# Patient Record
Sex: Female | Born: 1945
Health system: Southern US, Community
[De-identification: ages and names within clinical notes are randomized; demographics above are authoritative.]

## PROBLEM LIST (undated history)

## (undated) DIAGNOSIS — D126 Benign neoplasm of colon, unspecified: Secondary | ICD-10-CM

## (undated) DIAGNOSIS — K635 Polyp of colon: Secondary | ICD-10-CM

## (undated) DIAGNOSIS — M51369 Other intervertebral disc degeneration, lumbar region without mention of lumbar back pain or lower extremity pain: Secondary | ICD-10-CM

## (undated) DIAGNOSIS — R7989 Other specified abnormal findings of blood chemistry: Secondary | ICD-10-CM

## (undated) DIAGNOSIS — E079 Disorder of thyroid, unspecified: Secondary | ICD-10-CM

## (undated) DIAGNOSIS — G4700 Insomnia, unspecified: Secondary | ICD-10-CM

## (undated) DIAGNOSIS — D125 Benign neoplasm of sigmoid colon: Secondary | ICD-10-CM

## (undated) DIAGNOSIS — N318 Other neuromuscular dysfunction of bladder: Secondary | ICD-10-CM

## (undated) DIAGNOSIS — K648 Other hemorrhoids: Secondary | ICD-10-CM

## (undated) DIAGNOSIS — E785 Hyperlipidemia, unspecified: Secondary | ICD-10-CM

## (undated) DIAGNOSIS — M5136 Other intervertebral disc degeneration, lumbar region: Secondary | ICD-10-CM

## (undated) DIAGNOSIS — N189 Chronic kidney disease, unspecified: Secondary | ICD-10-CM

## (undated) DIAGNOSIS — K5790 Diverticulosis of intestine, part unspecified, without perforation or abscess without bleeding: Secondary | ICD-10-CM

## (undated) DIAGNOSIS — H269 Unspecified cataract: Secondary | ICD-10-CM

## (undated) DIAGNOSIS — I10 Essential (primary) hypertension: Secondary | ICD-10-CM

## (undated) DIAGNOSIS — F419 Anxiety disorder, unspecified: Secondary | ICD-10-CM

## (undated) DIAGNOSIS — R232 Flushing: Secondary | ICD-10-CM

## (undated) HISTORY — DX: Other intervertebral disc degeneration, lumbar region without mention of lumbar back pain or lower extremity pain: M51.369

## (undated) HISTORY — DX: Hyperlipidemia, unspecified: E78.5

## (undated) HISTORY — DX: Flushing: R23.2

## (undated) HISTORY — DX: Other hemorrhoids: K64.8

## (undated) HISTORY — DX: Benign neoplasm of colon, unspecified: D12.6

## (undated) HISTORY — DX: Polyp of colon: K63.5

## (undated) HISTORY — DX: Benign neoplasm of sigmoid colon: D12.5

## (undated) HISTORY — DX: Chronic kidney disease, unspecified: N18.9

## (undated) HISTORY — DX: Diverticulosis of intestine, part unspecified, without perforation or abscess without bleeding: K57.90

## (undated) HISTORY — DX: Other specified abnormal findings of blood chemistry: R79.89

## (undated) HISTORY — DX: Anxiety disorder, unspecified: F41.9

## (undated) HISTORY — DX: Unspecified cataract: H26.9

## (undated) HISTORY — DX: Other intervertebral disc degeneration, lumbar region: M51.36

## (undated) HISTORY — DX: Other neuromuscular dysfunction of bladder: N31.8

## (undated) HISTORY — DX: Essential (primary) hypertension: I10

## (undated) HISTORY — DX: Insomnia, unspecified: G47.00

## (undated) HISTORY — PX: CHOLECYSTECTOMY: SHX55

## (undated) HISTORY — DX: Disorder of thyroid, unspecified: E07.9

## (undated) HISTORY — PX: POLYPECTOMY: SHX149

---

## 1963-10-04 HISTORY — PX: ECTOPIC PREGNANCY SURGERY: SHX613

## 1967-10-04 HISTORY — PX: BREAST ENHANCEMENT SURGERY: SHX7

## 1998-04-03 ENCOUNTER — Ambulatory Visit (HOSPITAL_BASED_OUTPATIENT_CLINIC_OR_DEPARTMENT_OTHER): Admission: RE | Admit: 1998-04-03 | Discharge: 1998-04-03 | Payer: Self-pay | Admitting: Orthopedic Surgery

## 1998-12-02 ENCOUNTER — Ambulatory Visit (HOSPITAL_COMMUNITY): Admission: RE | Admit: 1998-12-02 | Discharge: 1998-12-02 | Payer: Self-pay | Admitting: Family Medicine

## 1998-12-10 ENCOUNTER — Other Ambulatory Visit: Admission: RE | Admit: 1998-12-10 | Discharge: 1998-12-10 | Payer: Self-pay | Admitting: *Deleted

## 1999-08-09 ENCOUNTER — Ambulatory Visit (HOSPITAL_COMMUNITY): Admission: RE | Admit: 1999-08-09 | Discharge: 1999-08-09 | Payer: Self-pay | Admitting: Gastroenterology

## 1999-12-14 ENCOUNTER — Other Ambulatory Visit: Admission: RE | Admit: 1999-12-14 | Discharge: 1999-12-14 | Payer: Self-pay | Admitting: *Deleted

## 2001-01-09 ENCOUNTER — Other Ambulatory Visit: Admission: RE | Admit: 2001-01-09 | Discharge: 2001-01-09 | Payer: Self-pay | Admitting: *Deleted

## 2002-02-09 ENCOUNTER — Emergency Department (HOSPITAL_COMMUNITY): Admission: EM | Admit: 2002-02-09 | Discharge: 2002-02-09 | Payer: Self-pay | Admitting: *Deleted

## 2002-05-04 ENCOUNTER — Emergency Department (HOSPITAL_COMMUNITY): Admission: EM | Admit: 2002-05-04 | Discharge: 2002-05-04 | Payer: Self-pay | Admitting: Emergency Medicine

## 2002-05-04 ENCOUNTER — Encounter: Payer: Self-pay | Admitting: Emergency Medicine

## 2002-05-20 ENCOUNTER — Encounter: Payer: Self-pay | Admitting: General Surgery

## 2002-05-20 ENCOUNTER — Encounter (INDEPENDENT_AMBULATORY_CARE_PROVIDER_SITE_OTHER): Payer: Self-pay | Admitting: *Deleted

## 2002-05-20 ENCOUNTER — Observation Stay (HOSPITAL_COMMUNITY): Admission: RE | Admit: 2002-05-20 | Discharge: 2002-05-21 | Payer: Self-pay | Admitting: General Surgery

## 2003-05-09 ENCOUNTER — Other Ambulatory Visit: Admission: RE | Admit: 2003-05-09 | Discharge: 2003-05-09 | Payer: Self-pay | Admitting: Obstetrics and Gynecology

## 2004-05-18 ENCOUNTER — Other Ambulatory Visit: Admission: RE | Admit: 2004-05-18 | Discharge: 2004-05-18 | Payer: Self-pay | Admitting: Obstetrics and Gynecology

## 2004-11-22 ENCOUNTER — Ambulatory Visit (HOSPITAL_COMMUNITY): Admission: RE | Admit: 2004-11-22 | Discharge: 2004-11-22 | Payer: Self-pay | Admitting: Gastroenterology

## 2004-12-14 ENCOUNTER — Other Ambulatory Visit: Admission: RE | Admit: 2004-12-14 | Discharge: 2004-12-14 | Payer: Self-pay | Admitting: Obstetrics and Gynecology

## 2005-08-11 ENCOUNTER — Other Ambulatory Visit: Admission: RE | Admit: 2005-08-11 | Discharge: 2005-08-11 | Payer: Self-pay | Admitting: Obstetrics and Gynecology

## 2007-04-23 ENCOUNTER — Ambulatory Visit: Admission: RE | Admit: 2007-04-23 | Discharge: 2007-04-23 | Payer: Self-pay | Admitting: Family Medicine

## 2007-05-09 ENCOUNTER — Emergency Department (HOSPITAL_COMMUNITY): Admission: EM | Admit: 2007-05-09 | Discharge: 2007-05-09 | Payer: Self-pay | Admitting: Emergency Medicine

## 2007-05-24 ENCOUNTER — Encounter: Admission: RE | Admit: 2007-05-24 | Discharge: 2007-05-24 | Payer: Self-pay | Admitting: Cardiology

## 2007-05-30 ENCOUNTER — Ambulatory Visit: Payer: Self-pay | Admitting: Internal Medicine

## 2007-06-12 ENCOUNTER — Ambulatory Visit: Payer: Self-pay | Admitting: Internal Medicine

## 2007-07-03 ENCOUNTER — Ambulatory Visit: Payer: Self-pay | Admitting: Internal Medicine

## 2007-09-18 ENCOUNTER — Telehealth: Payer: Self-pay | Admitting: Internal Medicine

## 2007-09-19 DIAGNOSIS — R0602 Shortness of breath: Secondary | ICD-10-CM | POA: Insufficient documentation

## 2007-09-19 DIAGNOSIS — I10 Essential (primary) hypertension: Secondary | ICD-10-CM | POA: Insufficient documentation

## 2007-09-20 ENCOUNTER — Ambulatory Visit: Payer: Self-pay | Admitting: Internal Medicine

## 2009-12-23 ENCOUNTER — Ambulatory Visit (HOSPITAL_COMMUNITY): Admission: RE | Admit: 2009-12-23 | Discharge: 2009-12-23 | Payer: Self-pay | Admitting: Obstetrics and Gynecology

## 2010-12-27 LAB — BASIC METABOLIC PANEL
BUN: 16 mg/dL (ref 6–23)
CO2: 24 mEq/L (ref 19–32)
Calcium: 8.8 mg/dL (ref 8.4–10.5)
Chloride: 104 mEq/L (ref 96–112)
Creatinine, Ser: 0.65 mg/dL (ref 0.4–1.2)
GFR calc Af Amer: >60 mL/min (ref >60)
GFR calc non Af Amer: >60 mL/min (ref >60)
Glucose, Bld: 114 mg/dL — ABNORMAL HIGH (ref 70–99)
Potassium: 3.7 mEq/L (ref 3.5–5.1)
Sodium: 134 mEq/L — ABNORMAL LOW (ref 135–145)

## 2010-12-27 LAB — URINALYSIS, ROUTINE W REFLEX MICROSCOPIC
Glucose, UA: NEGATIVE mg/dL
Ketones, ur: NEGATIVE mg/dL
Protein, ur: NEGATIVE mg/dL
Urobilinogen, UA: 0.2 mg/dL (ref 0.0–1.0)

## 2010-12-27 LAB — CBC
HCT: 35.7 % — ABNORMAL LOW (ref 36.0–46.0)
Hemoglobin: 12 g/dL (ref 12.0–15.0)
MCHC: 33.7 g/dL (ref 30.0–36.0)
MCV: 96.9 fL (ref 78.0–100.0)
Platelets: 267 10*3/uL (ref 150–400)
RBC: 3.69 MIL/uL — ABNORMAL LOW (ref 3.87–5.11)
RDW: 13.9 % (ref 11.5–15.5)
WBC: 6.1 10*3/uL (ref 4.0–10.5)

## 2010-12-27 LAB — URINE MICROSCOPIC-ADD ON

## 2011-02-15 NOTE — Assessment & Plan Note (Signed)
Kelli [name]                             PULMONARY OFFICE NOTE   [name], Kelli [name]                   MRN:          [id number]  DATE:06/12/2007                            DOB:          1946/08/25    PULMONARY/EXTENDED FOLLOWUP OFFICE VISIT   HISTORY:  This is a 65 year old white female with paroxysms of dyspnea  that occur at rest until she just gets a deep breath that I thought  might be reflux, ACE inhibitor, or panic related.  I had asked for her  to stop lisinopril on August 27 and she states she is no better.  She  denies any cough.  She also has shortness of breath with exertion, but  states that the problem is much worse at rest than it is with exertion.  She denies any exertional chest pain, orthopnea, PND, or significant  nocturnal exacerbation of these symptoms.   For full inventory of medications, please see face sheet dated June 12, 2007.   PHYSICAL EXAMINATION:  She is an anxious white female who cannot keep  still, pacing about the room, and opening her door and walking out into  the hall prior to the interview.  VITAL SIGNS:  She is afebrile with stable vital signs.  HEENT:  Unremarkable.  Oropharynx is clear.  LUNGS:  The lung fields are clear bilaterally to auscultation and  percussion.  CARDIAC:  Regular rate and rhythm without murmur, gallop, or rub.  ABDOMEN:  Soft and benign.  EXTREMITIES:  Warm without calf tenderness, cyanosis, clubbing, or  edema.   We walked the patient around the office for a lap and her pulse rate  increased to 115 with a saturation of 87%.  It was interesting in that  it did not bring on the typical symptoms she is experiencing.   CT scan was reviewed from August 21 and indicates no evidence of  pulmonary embolus or interstitial lung disease.  She has stable COPD  pattern.   IMPRESSION:  Activity-related desaturation is a reproducible problem  that actually does not explain her present  paroxysms of dyspnea at rest.  I believe this is related to chronic obstructive pulmonary disease and  have recommended a followup set of PFTs, but no treatment for now  directed at chronic obstructive pulmonary disease because I do not  believe it actually explains her symptoms.  Her pattern is more typical  of panic disorder, and I have recommended Xanax 1 mg at bedtime and then  a half a pill at breakfast, lunch, and supper to see if this improves  the problem.   I have advised her to return in 2 weeks for followup, but we would like  to see her sooner if needed.     Charlaine Dalton. Sherene Sires, MD, [facility]  Electronically Signed    MBW/MedQ  DD: 06/13/2007  DT: 06/14/2007  Job #: [id number]   cc:   Tally Joe, M.D.

## 2011-02-15 NOTE — Assessment & Plan Note (Signed)
[facility]                             PULMONARY OFFICE NOTE   [name], Kelli ZEISER                   MRN:          [id number]  DATE:07/03/2007                            DOB:          August 05, 1946    PULMONARY FINAL FOLLOWUP OFFICE VISIT:   HISTORY:  A 65 year old white female with unexplained dyspnea, doing  much better off of ACE inhibitors since May 30, 2007.  I had been  concerned because an exercise test here in the office suggested she  might have desaturated and that was not explained by an upper airway  problem, but she had normal PFTs in August including DLCO and a  subsequent CT scan of the chest was done on August 21 that showed no  evidence of thromboembolic disease or interstitial lung disease.   She comes back today all smiles with improving activity tolerance, no  nocturnal symptoms, chest pain, fevers, chills, orthopnea, PND or leg  swelling.   PHYSICAL EXAMINATION:  Her blood pressure today is only 98/64 after  taking Benicar 20/12.5 this morning.  She has stable vital signs.  On physical examination, she is a pleasant, ambulatory white female in  no acute distress.  HEENT:  Unremarkable.  Oropharynx clear.  LUNGS:  Fields are completely clear bilaterally to auscultation and  percussion, although she did have mild to moderate kyphotic deformity of  the chest.  There is a regular rhythm without murmur, gallop or rub.  ABDOMEN:  Soft and benign.  EXTREMITIES:  Warm without calf tenderness, cyanosis, clubbing or edema.   We walked the patient around the office again today and this time she  did not desaturate with a peak heart rate of 121.   IMPRESSION:  Marked improvement in symptoms simply by eliminating ACE  inhibitor from the regimen.  Her blood pressure, however, appears to be  over-treated at present on Benicar and therefore I am going to recommend  Micardis 40 to take one-half daily (samples for 2 months) until she  sees  Dr. Azucena Cecil for followup.   Parenthetically, I have noticed many patients who cannot tolerate ACE  inhibitor also have reflux, but since she has no overt symptoms now, I  do not believe we need to pursue this issue further.  Pulmonary followup  can therefore be as needed.     Charlaine Dalton. Sherene Sires, MD, [facility]  Electronically Signed   MBW/MedQ  DD: 07/03/2007  DT: 07/04/2007  Job #: [id number]   cc:   Tally Joe, M.D.

## 2011-02-15 NOTE — Assessment & Plan Note (Signed)
[facility]                             PULMONARY OFFICE NOTE   NAME:Mcduffey, BALERIA WYMAN                   MRN:          [id number]  DATE:05/30/2007                            DOB:          10-01-1946    PULMONARY CONSULTATION:   REQUESTED BY:  Dr. Azucena Cecil.   CHIEF COMPLAINT:  Dyspnea.   HISTORY:  This is a 65 year old white female whom I evaluated over 15  years ago for dyspnea that resolved without treatment.  I do not have  any of the records and she does not remember the impression or  recommendations that were made, but did not require any form of chronic  therapy thereafter.  She was, however, bothered by paroxysms of dyspnea  that would occur maybe 1 or 2 days a year, but seemed to get better on  their own without any treatment and left her with a sensation  intermittently that she just couldn't get a comfortable deep breath,  that would go away spontaneously within seconds (typical of  hyperventilation syndrome).  She comes in now, however, with a new  complaint that every breath is not deep enough since July that is  worse with exertion and now for the first time, also worse when she  tries to talk.  Associated with this symptom, she has classic voice  fatigue in that she becomes hoarse the more she talks, but has no  significant cough, pleuritic plain, exertional pain, fevers, chills,  sweats, orthopnea, PND, leg swelling or significant variability with any  particular trigger or alleviating factor.   PAST MEDICAL HISTORY:  1. Hypertension, for which she was placed on ACE inhibitors in April      that she says is all due to personal stress.  2. She is status post cholecystectomy and breast implant surgery.   ALLERGIES:  None known.   MEDICATIONS:  Provera, Lipitor, levothyroxine, estradiol and lisinopril;  for full dosing, please see the medication face sheet dated May 30, 2007.   SOCIAL HISTORY:  She quit smoking 8 years ago,  denies any respiratory  complaints related to this and has had no weight gain.  Since then she  has retired, but no unusual travel, pet or hobby exposure.   FAMILY HISTORY:  Significant for lung cancer in her mother, who was a  smoker.   REVIEW OF SYSTEMS:  Taken in detail on the worksheet and negative except  as outlined above.   PHYSICAL EXAMINATION:  This is a pleasant, ambulatory, but quite anxious  white female in no acute distress with classic voice fatigue.  She had  stable vital signs.  HEENT:  Oropharynx is clear.  Dentition intact.  NECK:  Supple without lymphadenopathy or tenderness.  Trachea is  midline.  No thyromegaly.  LUNGS:  Fields reveal classic pseudowheeze only.  Overall air movement  is adequate.  She did have moderate kyphosis.  Lung fields were  perfectly clear otherwise with excellent air movement.  There is a regular rate and rhythm without murmur, gallop or rub.  ABDOMEN:  Soft and benign.  EXTREMITIES:  Warm without calf tenderness,  cyanosis, clubbing or edema.   PFTs were reviewed from [facility] with the date not legible,  but apparently done this month and are completely normal.   CT was reviewed from August 21 and indicates no evidence of pulmonary  embolism, mild COPD changes only.   IMPRESSION:  Classic upper airways dysfunction suggested by the fact  that she has just as much trouble talking as she does with exertion and  becomes quite hoarse when she does talk (described as voice fatigue in  the note above).  She demonstrated this for me today as well as  pseudowheezing; this would strongly point to the upper airway being the  source of her problem.  I suspect this is ACE-inhibitor-related and/or  reflux related and recommended the following:   1. First I reviewed a gastroesophageal reflux disease diet with her      and recommended Nexium samples, but only for 10 days (to allow the      ACE inhibitor to wash out).  2. Switch from  lisinopril to Benicar 20/12.5 one daily.   I emphasized to the patient that ACE inhibitors specifically do not  cause this problem, but certainly can exacerbate a pre-existent problem  like low-grade reflux.  I also suspect that there is a component of  anxiety playing a role here and recommended that she use p.r.n.  alprazolam for this.   The main risk factor for chronic reflux would be the use of hormone  replacement therapy, but off of ACE inhibitors, this should not be an  issue.  If she does not clear quickly in terms of her symptoms, the next  step would be to do an exercise stress test and consider elimination of  hormone replacement therapy (or add a more aggressive reflux regimen  such as a combination of proton pump inhibitors plus metoclopramide).  However, I do not believe this patient has a significant pulmonary  problem at this point and did arrange for regular followup.     Charlaine Dalton. Sherene Sires, MD, [facility]  Electronically Signed    MBW/MedQ  DD: 05/30/2007  DT: 05/31/2007  Job #: [id number]   cc:   Tally Joe, M.D.

## 2011-02-18 NOTE — Op Note (Signed)
NAMEJORDI, Kelli [name]            ACCOUNT NO.:  [id number]   MEDICAL RECORD NO.:  [id number]          PATIENT TYPE:  AMB   LOCATION:  ENDO                         FACILITY:  [facility]   PHYSICIAN:  Bernette Redbird, M.D.   DATE OF BIRTH:  05-18-1946   DATE OF PROCEDURE:  11/22/2004  DATE OF DISCHARGE:                                 OPERATIVE REPORT   PROCEDURE:  Colonoscopy.   INDICATIONS:  Follow-up of previous small colonic adenoma removed many years  ago, with her last two surveillance exams having been negative.   FINDINGS:  Normal examination.   PROCEDURE:  The nature, purpose, risks of the procedure were familiar to the  patient, who provided written consent. Sedation was fentanyl 75 mcg and  Versed 8 milligrams IV without arrhythmias or desaturation. The Olympus  adjustable tension pediatric video colonoscope was advanced to the cecum  without significant difficulty, as identified by visualization of the  appendiceal orifice and the absence of further lumen, and pullback was then  performed. The quality of prep was very good, and it is felt that all areas  were adequately seen.   This was a normal examination. No polyps, cancer, colitis, vascular  malformations or diverticulosis were noted. No biopsies were obtained.  Retroflexion of the rectum and reinspection of the rectum were unremarkable.  The patient tolerated the procedure well, and there no apparent  complications.   IMPRESSION:  Normal surveillance colonoscopy in a patient with a prior  history of a colonic adenoma having been removed (V12.72).   PLAN:  Follow-up colonoscopy in 5 years.      RB/MEDQ  D:  11/22/2004  T:  11/22/2004  Job:  [id number]   cc:   Tally Joe, M.D.  [address] [place] [address]  [place], [place] [address]  Fax: [phone]

## 2011-02-18 NOTE — Op Note (Signed)
Kelli [name], Kelli [name]                     ACCOUNT NO.:  [id number]   MEDICAL RECORD NO.:  [id number]                   PATIENT TYPE:  OBV   LOCATION:  0375                                 FACILITY:  [facility]   PHYSICIAN:  Timothy E. Earlene Plater, M.D.              DATE OF BIRTH:  1946-09-25   DATE OF PROCEDURE:  05/20/2002  DATE OF DISCHARGE:                                 OPERATIVE REPORT   PREOPERATIVE DIAGNOSIS:  Acute episode chronic cholecystolithiasis.   POSTOPERATIVE DIAGNOSIS:  Acute episode chronic cholecystolithiasis.   OPERATIVE PROCEDURE:  Laparoscopic cholecystectomy and operative  cholangiogram.   SURGEON:  Timothy E. Earlene Plater, M.D.   ASSISTANT:  Velora Heckler, M.D.   ANESTHESIA:  General.   INDICATIONS FOR PROCEDURE:  The patient visited my office last week.  She  had had her second acute attack of gallbladder colic, was seen in the  emergency room.  Everything was normal except the ultrasound which showed  stones, slight elevation of the SGOT.  She was carefully counseled and she  wishes to proceed at this time.   DESCRIPTION OF PROCEDURE:  She was identified and permit properly signed and  evaluated by anesthesia, then taken to the operating room, placed supine  general endotracheal anesthesia administered.  The abdomen was prepped, and  draped in the usual fashion.  Marcaine 0.5% with epinephrine was used prior  to each incision.  An infraumbilical incision made, the fascia identified,  opened in the midline, the peritoneum entered without complication.  The  Hasson catheter placed, tied in place with #1 Vicryl, and the abdomen  insufflated.  Peritoneoscopy revealed a thickened, partially intrahepatic  gallbladder, and there was one isolated adhesion between an omental  epiploica to the anterior abdominal wall in the lower abdomen.  Attention  was turned to the gallbladder.  A second 10 mm trocar placed in the mid  epigastrium and two 5 mm trocars in the right  upper quadrant.  The  gallbladder was grasped, placed on tension, careful dissection at the base  of the gallbladder revealed a normal-appearing cystic structure entering the  gallbladder.  Behind that, was a normal-appearing cystic artery.  The cystic  duct was dissected out.  A clip was placed on the gallbladder side of the  cystic duct, small incision made in the cystic duct and using the Reddick  catheter which was placed into the cystic duct remnant, using real-time  fluoroscopy, cholangiograph was accomplished showing quick and smooth flow  of bile into the duodenum, filling a normal common bile duct, common hepatic  duct, and hepatic radicles.  There was no evidence for stone or obstruction.  The catheter was removed, the cystic duct remnant triply clipped, fully  divided.  Behind that, the cystic artery was quadruple clipped and divided.  The gallbladder was then dissected from the gallbladder bed.  One incidental  tear made in the gallbladder during this procedure, and there was  a slight  leak of bile and one stone.  The gallbladder was then completely removed  without further incident.  The gallbladder bed was dry.  The stone was  suctioned away.  The gallbladder was placed into an EndoCatch bag and then  copious irrigation carried out.  The EndoCatch bag and gallbladder removed  through the infraumbilical incision which was then tied under direct vision.  A single adhesion in the lower abdomen was divided with the Endoshears and  cautery.  There was no consequence.  Further irrigation was carried out until completely clear.  All irrigant,  CO2, trocars, and instruments removed under direct vision.  The skin  incisions were closed with 3-0 Monocryl.  Steri-Strips applied; dry sterile  dressing applied.  Counts correct.  She tolerated it well and was awakened  and taken to recovery room in good condition.                                               Timothy E. Earlene Plater, M.D.     TED/MEDQ  D:  05/20/2002  T:  05/20/2002  Job:  [id number]   cc:   Meredith Staggers, M.D.

## 2011-05-14 ENCOUNTER — Emergency Department (HOSPITAL_COMMUNITY): Payer: Medicare Other

## 2011-05-14 ENCOUNTER — Emergency Department (HOSPITAL_COMMUNITY)
Admission: EM | Admit: 2011-05-14 | Discharge: 2011-05-14 | Disposition: A | Discharge status: Home / Self Care | Payer: Medicare Other | Attending: Emergency Medicine | Admitting: Emergency Medicine

## 2011-05-14 DIAGNOSIS — R0609 Other forms of dyspnea: Secondary | ICD-10-CM | POA: Insufficient documentation

## 2011-05-14 DIAGNOSIS — Z79899 Other long term (current) drug therapy: Secondary | ICD-10-CM | POA: Insufficient documentation

## 2011-05-14 DIAGNOSIS — I10 Essential (primary) hypertension: Secondary | ICD-10-CM | POA: Insufficient documentation

## 2011-05-14 DIAGNOSIS — E789 Disorder of lipoprotein metabolism, unspecified: Secondary | ICD-10-CM | POA: Insufficient documentation

## 2011-05-14 DIAGNOSIS — F411 Generalized anxiety disorder: Secondary | ICD-10-CM | POA: Insufficient documentation

## 2011-05-14 DIAGNOSIS — R0602 Shortness of breath: Secondary | ICD-10-CM | POA: Insufficient documentation

## 2011-05-14 DIAGNOSIS — R0989 Other specified symptoms and signs involving the circulatory and respiratory systems: Secondary | ICD-10-CM | POA: Insufficient documentation

## 2011-05-14 DIAGNOSIS — E039 Hypothyroidism, unspecified: Secondary | ICD-10-CM | POA: Insufficient documentation

## 2011-05-14 LAB — POCT I-STAT TROPONIN I: Troponin i, poc: 0 ng/mL (ref 0.00–0.08)

## 2011-05-16 LAB — BASIC METABOLIC PANEL
Chloride: 106 mEq/L (ref 96–112)
GFR calc Af Amer: >60 mL/min (ref >60)
GFR calc non Af Amer: >60 mL/min (ref >60)
Potassium: 3.7 mEq/L (ref 3.5–5.1)
Sodium: 138 mEq/L (ref 135–145)

## 2011-05-16 LAB — DIFFERENTIAL
Lymphocytes Relative: 37 % (ref 12–46)
Lymphs Abs: 2.3 10*3/uL (ref 0.7–4.0)
Monocytes Relative: 9 % (ref 3–12)
Neutro Abs: 3.3 10*3/uL (ref 1.7–7.7)
Neutrophils Relative %: 54 % (ref 43–77)

## 2011-05-16 LAB — CBC
HCT: 33.5 % — ABNORMAL LOW (ref 36.0–46.0)
Hemoglobin: 11.7 g/dL — ABNORMAL LOW (ref 12.0–15.0)
RBC: 3.64 MIL/uL — ABNORMAL LOW (ref 3.87–5.11)

## 2011-07-18 LAB — DIFFERENTIAL
Basophils Relative: 0
Eosinophils Relative: 1
Monocytes Absolute: 0.8 — ABNORMAL HIGH
Monocytes Relative: 9
Neutro Abs: 5

## 2011-07-18 LAB — CBC
HCT: 38.5
Hemoglobin: 13.1
MCHC: 34.1
MCV: 93.9
RBC: 4.1

## 2011-07-18 LAB — POCT I-STAT CREATININE
Creatinine, Ser: 0.8
Operator id: 277751

## 2011-07-18 LAB — POCT CARDIAC MARKERS
Operator id: 277751
Troponin i, poc: <0.05

## 2011-07-18 LAB — I-STAT 8, (EC8 V) (CONVERTED LAB)
Bicarbonate: 24.2 — ABNORMAL HIGH
Glucose, Bld: 105 — ABNORMAL HIGH
Hemoglobin: 13.9
Sodium: 138
TCO2: 25
pH, Ven: 7.48 — ABNORMAL HIGH

## 2011-08-03 ENCOUNTER — Other Ambulatory Visit: Payer: Self-pay | Admitting: Obstetrics and Gynecology

## 2011-10-04 HISTORY — PX: ROTATOR CUFF REPAIR: SHX139

## 2011-10-13 DIAGNOSIS — N760 Acute vaginitis: Secondary | ICD-10-CM | POA: Diagnosis not present

## 2011-10-24 DIAGNOSIS — F411 Generalized anxiety disorder: Secondary | ICD-10-CM | POA: Diagnosis not present

## 2011-10-24 DIAGNOSIS — E782 Mixed hyperlipidemia: Secondary | ICD-10-CM | POA: Diagnosis not present

## 2011-10-24 DIAGNOSIS — R7309 Other abnormal glucose: Secondary | ICD-10-CM | POA: Diagnosis not present

## 2011-10-24 DIAGNOSIS — I10 Essential (primary) hypertension: Secondary | ICD-10-CM | POA: Diagnosis not present

## 2011-10-24 DIAGNOSIS — G4700 Insomnia, unspecified: Secondary | ICD-10-CM | POA: Diagnosis not present

## 2011-10-24 DIAGNOSIS — E039 Hypothyroidism, unspecified: Secondary | ICD-10-CM | POA: Diagnosis not present

## 2011-10-24 DIAGNOSIS — R232 Flushing: Secondary | ICD-10-CM | POA: Diagnosis not present

## 2011-10-28 ENCOUNTER — Ambulatory Visit: Payer: Medicare Other | Admitting: Gastroenterology

## 2011-11-09 DIAGNOSIS — J40 Bronchitis, not specified as acute or chronic: Secondary | ICD-10-CM | POA: Diagnosis not present

## 2011-11-21 DIAGNOSIS — N390 Urinary tract infection, site not specified: Secondary | ICD-10-CM | POA: Diagnosis not present

## 2011-11-23 DIAGNOSIS — N390 Urinary tract infection, site not specified: Secondary | ICD-10-CM | POA: Diagnosis not present

## 2012-01-23 DIAGNOSIS — M21619 Bunion of unspecified foot: Secondary | ICD-10-CM | POA: Diagnosis not present

## 2012-01-23 DIAGNOSIS — M25519 Pain in unspecified shoulder: Secondary | ICD-10-CM | POA: Diagnosis not present

## 2012-02-01 DIAGNOSIS — J069 Acute upper respiratory infection, unspecified: Secondary | ICD-10-CM | POA: Diagnosis not present

## 2012-02-03 DIAGNOSIS — M25519 Pain in unspecified shoulder: Secondary | ICD-10-CM | POA: Diagnosis not present

## 2012-02-07 DIAGNOSIS — N390 Urinary tract infection, site not specified: Secondary | ICD-10-CM | POA: Diagnosis not present

## 2012-02-17 DIAGNOSIS — M25519 Pain in unspecified shoulder: Secondary | ICD-10-CM | POA: Diagnosis not present

## 2012-02-24 DIAGNOSIS — M25519 Pain in unspecified shoulder: Secondary | ICD-10-CM | POA: Diagnosis not present

## 2012-02-29 DIAGNOSIS — M21619 Bunion of unspecified foot: Secondary | ICD-10-CM | POA: Diagnosis not present

## 2012-02-29 DIAGNOSIS — M7750 Other enthesopathy of unspecified foot: Secondary | ICD-10-CM | POA: Diagnosis not present

## 2012-03-13 DIAGNOSIS — N390 Urinary tract infection, site not specified: Secondary | ICD-10-CM | POA: Diagnosis not present

## 2012-03-13 DIAGNOSIS — N3000 Acute cystitis without hematuria: Secondary | ICD-10-CM | POA: Diagnosis not present

## 2012-03-23 DIAGNOSIS — R3989 Other symptoms and signs involving the genitourinary system: Secondary | ICD-10-CM | POA: Diagnosis not present

## 2012-03-23 DIAGNOSIS — N3941 Urge incontinence: Secondary | ICD-10-CM | POA: Diagnosis not present

## 2012-03-26 DIAGNOSIS — M25519 Pain in unspecified shoulder: Secondary | ICD-10-CM | POA: Diagnosis not present

## 2012-03-26 DIAGNOSIS — M25579 Pain in unspecified ankle and joints of unspecified foot: Secondary | ICD-10-CM | POA: Diagnosis not present

## 2012-04-27 DIAGNOSIS — R3989 Other symptoms and signs involving the genitourinary system: Secondary | ICD-10-CM | POA: Diagnosis not present

## 2012-04-27 DIAGNOSIS — N3020 Other chronic cystitis without hematuria: Secondary | ICD-10-CM | POA: Diagnosis not present

## 2012-04-27 DIAGNOSIS — N3941 Urge incontinence: Secondary | ICD-10-CM | POA: Diagnosis not present

## 2012-05-12 DIAGNOSIS — M25519 Pain in unspecified shoulder: Secondary | ICD-10-CM | POA: Diagnosis not present

## 2012-05-17 ENCOUNTER — Other Ambulatory Visit: Payer: Self-pay | Admitting: Orthopedic Surgery

## 2012-05-17 DIAGNOSIS — R52 Pain, unspecified: Secondary | ICD-10-CM

## 2012-05-22 ENCOUNTER — Ambulatory Visit
Admission: RE | Admit: 2012-05-22 | Discharge: 2012-05-22 | Disposition: A | Discharge status: Home / Self Care | Payer: Medicare Other | Source: Ambulatory Visit | Attending: Orthopedic Surgery | Admitting: Orthopedic Surgery

## 2012-05-22 DIAGNOSIS — M19019 Primary osteoarthritis, unspecified shoulder: Secondary | ICD-10-CM | POA: Diagnosis not present

## 2012-05-22 DIAGNOSIS — M719 Bursopathy, unspecified: Secondary | ICD-10-CM | POA: Diagnosis not present

## 2012-05-22 DIAGNOSIS — R52 Pain, unspecified: Secondary | ICD-10-CM

## 2012-05-22 DIAGNOSIS — M67919 Unspecified disorder of synovium and tendon, unspecified shoulder: Secondary | ICD-10-CM | POA: Diagnosis not present

## 2012-05-22 DIAGNOSIS — M25519 Pain in unspecified shoulder: Secondary | ICD-10-CM | POA: Diagnosis not present

## 2012-05-28 DIAGNOSIS — M75120 Complete rotator cuff tear or rupture of unspecified shoulder, not specified as traumatic: Secondary | ICD-10-CM | POA: Diagnosis not present

## 2012-06-07 DIAGNOSIS — M67919 Unspecified disorder of synovium and tendon, unspecified shoulder: Secondary | ICD-10-CM | POA: Diagnosis not present

## 2012-06-07 DIAGNOSIS — M719 Bursopathy, unspecified: Secondary | ICD-10-CM | POA: Diagnosis not present

## 2012-06-07 DIAGNOSIS — M19019 Primary osteoarthritis, unspecified shoulder: Secondary | ICD-10-CM | POA: Diagnosis not present

## 2012-06-26 DIAGNOSIS — M25519 Pain in unspecified shoulder: Secondary | ICD-10-CM | POA: Diagnosis not present

## 2012-07-02 DIAGNOSIS — M19019 Primary osteoarthritis, unspecified shoulder: Secondary | ICD-10-CM | POA: Diagnosis not present

## 2012-07-02 DIAGNOSIS — M25519 Pain in unspecified shoulder: Secondary | ICD-10-CM | POA: Diagnosis not present

## 2012-07-10 DIAGNOSIS — M25519 Pain in unspecified shoulder: Secondary | ICD-10-CM | POA: Diagnosis not present

## 2012-07-16 DIAGNOSIS — L219 Seborrheic dermatitis, unspecified: Secondary | ICD-10-CM | POA: Diagnosis not present

## 2012-07-17 DIAGNOSIS — M25519 Pain in unspecified shoulder: Secondary | ICD-10-CM | POA: Diagnosis not present

## 2012-07-24 DIAGNOSIS — M25519 Pain in unspecified shoulder: Secondary | ICD-10-CM | POA: Diagnosis not present

## 2012-07-30 DIAGNOSIS — M25519 Pain in unspecified shoulder: Secondary | ICD-10-CM | POA: Diagnosis not present

## 2012-08-06 DIAGNOSIS — M25519 Pain in unspecified shoulder: Secondary | ICD-10-CM | POA: Diagnosis not present

## 2012-08-09 DIAGNOSIS — R232 Flushing: Secondary | ICD-10-CM | POA: Diagnosis not present

## 2012-08-09 DIAGNOSIS — Z23 Encounter for immunization: Secondary | ICD-10-CM | POA: Diagnosis not present

## 2012-08-09 DIAGNOSIS — N318 Other neuromuscular dysfunction of bladder: Secondary | ICD-10-CM | POA: Diagnosis not present

## 2012-08-09 DIAGNOSIS — F411 Generalized anxiety disorder: Secondary | ICD-10-CM | POA: Diagnosis not present

## 2012-08-09 DIAGNOSIS — G4700 Insomnia, unspecified: Secondary | ICD-10-CM | POA: Diagnosis not present

## 2012-08-09 DIAGNOSIS — E039 Hypothyroidism, unspecified: Secondary | ICD-10-CM | POA: Diagnosis not present

## 2012-08-09 DIAGNOSIS — E782 Mixed hyperlipidemia: Secondary | ICD-10-CM | POA: Diagnosis not present

## 2012-08-09 DIAGNOSIS — R7309 Other abnormal glucose: Secondary | ICD-10-CM | POA: Diagnosis not present

## 2012-08-09 DIAGNOSIS — I10 Essential (primary) hypertension: Secondary | ICD-10-CM | POA: Diagnosis not present

## 2012-10-18 DIAGNOSIS — E039 Hypothyroidism, unspecified: Secondary | ICD-10-CM | POA: Diagnosis not present

## 2012-10-18 DIAGNOSIS — R7989 Other specified abnormal findings of blood chemistry: Secondary | ICD-10-CM | POA: Diagnosis not present

## 2012-10-18 DIAGNOSIS — R232 Flushing: Secondary | ICD-10-CM | POA: Diagnosis not present

## 2012-10-18 DIAGNOSIS — I10 Essential (primary) hypertension: Secondary | ICD-10-CM | POA: Diagnosis not present

## 2012-10-18 DIAGNOSIS — F411 Generalized anxiety disorder: Secondary | ICD-10-CM | POA: Diagnosis not present

## 2012-10-18 DIAGNOSIS — G4700 Insomnia, unspecified: Secondary | ICD-10-CM | POA: Diagnosis not present

## 2012-10-18 DIAGNOSIS — N318 Other neuromuscular dysfunction of bladder: Secondary | ICD-10-CM | POA: Diagnosis not present

## 2012-11-09 DIAGNOSIS — M25579 Pain in unspecified ankle and joints of unspecified foot: Secondary | ICD-10-CM | POA: Diagnosis not present

## 2012-11-09 DIAGNOSIS — M25519 Pain in unspecified shoulder: Secondary | ICD-10-CM | POA: Diagnosis not present

## 2012-11-29 ENCOUNTER — Other Ambulatory Visit: Payer: Self-pay | Admitting: Orthopaedic Surgery

## 2012-12-03 ENCOUNTER — Inpatient Hospital Stay: Admission: RE | Admit: 2012-12-03 | Payer: Medicare Other | Source: Ambulatory Visit

## 2012-12-05 ENCOUNTER — Ambulatory Visit
Admission: RE | Admit: 2012-12-05 | Discharge: 2012-12-05 | Disposition: A | Discharge status: Home / Self Care | Payer: Medicare Other | Source: Ambulatory Visit | Attending: Orthopaedic Surgery | Admitting: Orthopaedic Surgery

## 2012-12-05 DIAGNOSIS — M25519 Pain in unspecified shoulder: Secondary | ICD-10-CM | POA: Diagnosis not present

## 2012-12-10 DIAGNOSIS — M25519 Pain in unspecified shoulder: Secondary | ICD-10-CM | POA: Diagnosis not present

## 2012-12-18 ENCOUNTER — Other Ambulatory Visit: Payer: Self-pay

## 2012-12-18 ENCOUNTER — Other Ambulatory Visit: Payer: Self-pay | Admitting: *Deleted

## 2012-12-18 MED ORDER — CITALOPRAM HYDROBROMIDE 20 MG PO TABS: ORAL_TABLET | ORAL | Status: DC

## 2012-12-20 ENCOUNTER — Other Ambulatory Visit: Payer: Self-pay | Admitting: Geriatric Medicine

## 2012-12-20 DIAGNOSIS — R7989 Other specified abnormal findings of blood chemistry: Secondary | ICD-10-CM

## 2012-12-20 DIAGNOSIS — I10 Essential (primary) hypertension: Secondary | ICD-10-CM

## 2013-01-03 DIAGNOSIS — M25519 Pain in unspecified shoulder: Secondary | ICD-10-CM | POA: Diagnosis not present

## 2013-01-31 ENCOUNTER — Other Ambulatory Visit: Payer: Medicare Other

## 2013-01-31 DIAGNOSIS — R7989 Other specified abnormal findings of blood chemistry: Secondary | ICD-10-CM | POA: Diagnosis not present

## 2013-01-31 DIAGNOSIS — E039 Hypothyroidism, unspecified: Secondary | ICD-10-CM | POA: Diagnosis not present

## 2013-01-31 DIAGNOSIS — I10 Essential (primary) hypertension: Secondary | ICD-10-CM

## 2013-02-01 LAB — HEMOGLOBIN A1C
Est. average glucose Bld gHb Est-mCnc: 123 mg/dL
Hgb A1c MFr Bld: 5.9 % — ABNORMAL HIGH (ref 4.8–5.6)

## 2013-02-01 LAB — BASIC METABOLIC PANEL
BUN/Creatinine Ratio: 23 (ref 11–26)
BUN: 17 mg/dL (ref 8–27)
CO2: 28 mmol/L (ref 19–28)
Calcium: 9.5 mg/dL (ref 8.6–10.2)
Chloride: 104 mmol/L (ref 97–108)
Creatinine, Ser: 0.74 mg/dL (ref 0.57–1.00)
GFR calc Af Amer: 96 mL/min/{1.73_m2} (ref >59)
GFR calc non Af Amer: 83 mL/min/{1.73_m2} (ref >59)
Glucose: 94 mg/dL (ref 65–99)
Potassium: 5 mmol/L (ref 3.5–5.2)
Sodium: 142 mmol/L (ref 134–144)

## 2013-02-01 LAB — CBC WITH DIFFERENTIAL/PLATELET
Basophils Absolute: 0 10*3/uL (ref 0.0–0.2)
Basos: 0 % (ref 0–3)
Eos: 1 % (ref 0–5)
Eosinophils Absolute: 0.1 10*3/uL (ref 0.0–0.4)
HCT: 36.9 % (ref 34.0–46.6)
Hemoglobin: 12.5 g/dL (ref 11.1–15.9)
Immature Grans (Abs): 0 10*3/uL (ref 0.0–0.1)
Immature Granulocytes: 0 % (ref 0–2)
Lymphocytes Absolute: 2.1 10*3/uL (ref 0.7–3.1)
Lymphs: 41 % (ref 14–46)
MCH: 31.5 pg (ref 26.6–33.0)
MCHC: 33.9 g/dL (ref 31.5–35.7)
MCV: 93 fL (ref 79–97)
Monocytes Absolute: 0.5 10*3/uL (ref 0.1–0.9)
Monocytes: 10 % (ref 4–12)
Neutrophils Absolute: 2.5 10*3/uL (ref 1.4–7.0)
Neutrophils Relative %: 48 % (ref 40–74)
RBC: 3.97 x10E6/uL (ref 3.77–5.28)
RDW: 13.6 % (ref 12.3–15.4)
WBC: 5.1 10*3/uL (ref 3.4–10.8)

## 2013-02-01 LAB — TSH: TSH: 1.43 u[IU]/mL (ref 0.450–4.500)

## 2013-02-08 ENCOUNTER — Encounter: Payer: Self-pay | Admitting: *Deleted

## 2013-02-11 ENCOUNTER — Ambulatory Visit (INDEPENDENT_AMBULATORY_CARE_PROVIDER_SITE_OTHER): Payer: Medicare Other | Admitting: Internal Medicine

## 2013-02-11 ENCOUNTER — Encounter: Payer: Self-pay | Admitting: Internal Medicine

## 2013-02-11 VITALS — BP 106/60 | HR 71 | Temp 97.7°F | Resp 12 | Ht <= 58 in | Wt 121.0 lb

## 2013-02-11 DIAGNOSIS — J302 Other seasonal allergic rhinitis: Secondary | ICD-10-CM

## 2013-02-11 DIAGNOSIS — F3289 Other specified depressive episodes: Secondary | ICD-10-CM

## 2013-02-11 DIAGNOSIS — J309 Allergic rhinitis, unspecified: Secondary | ICD-10-CM

## 2013-02-11 DIAGNOSIS — E039 Hypothyroidism, unspecified: Secondary | ICD-10-CM | POA: Diagnosis not present

## 2013-02-11 DIAGNOSIS — F329 Major depressive disorder, single episode, unspecified: Secondary | ICD-10-CM | POA: Diagnosis not present

## 2013-02-11 DIAGNOSIS — R7309 Other abnormal glucose: Secondary | ICD-10-CM

## 2013-02-11 DIAGNOSIS — R7303 Prediabetes: Secondary | ICD-10-CM

## 2013-02-11 MED ORDER — FREESTYLE LANCETS MISC: Status: DC

## 2013-02-11 MED ORDER — LEVOTHYROXINE SODIUM 88 MCG PO TABS: 88.0000 ug | ORAL_TABLET | Freq: Every day | ORAL | Status: DC

## 2013-02-11 MED ORDER — FLUTICASONE PROPIONATE 50 MCG/ACT NA SUSP: 2.0000 | Freq: Every day | NASAL | Status: DC

## 2013-02-11 MED ORDER — GLUCOSE BLOOD VI STRP: ORAL_STRIP | Status: DC

## 2013-02-11 MED ORDER — CITALOPRAM HYDROBROMIDE 20 MG PO TABS: ORAL_TABLET | ORAL | Status: DC

## 2013-02-11 NOTE — Progress Notes (Signed)
Patient ID: Kelli [name], female   DOB: [birth year], 67 y.o.   MRN: [id number]  No Known Allergies  Chief Complaint  Patient presents with  . Medical Managment of Chronic Issues    allergies and a possible common cold    HPI: Patient is a 67 y.o. white female seen in the office today for her routine visit, but has several acute concerns.    Was not taking allergy medicine, but now started allegra.  Was sneezing.  Now eyeball hurts and stuffy nose.  Daughter uses flonase and she wonders if it would help her.  Couple of times, felt like it was down in her chest.  Mostly at night.  Not much cough.    Is worried about her glucose--labs I had done showed prediabetes.    Sleeping well with the zolpidem--has required high dose--denies side effects.    Review of Systems:  Review of Systems  Constitutional: Positive for malaise/fatigue. Negative for fever and chills.  HENT: Positive for congestion and sore throat.   Eyes: Positive for redness. Negative for blurred vision.  Respiratory: Positive for cough and sputum production. Negative for shortness of breath.   Cardiovascular: Negative for chest pain.  Gastrointestinal: Negative for constipation.  Musculoskeletal: Negative for falls.  Skin: Negative for rash.  Neurological: Positive for headaches. Negative for dizziness.  Psychiatric/Behavioral: Positive for depression. Negative for memory loss. The patient is nervous/anxious and has insomnia.      Past Medical History  Diagnosis Date  . Diverticulosis   . Sigmoid polyp   . Colon polyp     Tubular Adenoma  . Colon polyp, hyperplastic   . Internal hemorrhoid   . Thyroid disease   . Hyperlipidemia   . Anxiety   . Hypertension   . Hypertonicity of bladder   . Insomnia, unspecified   . Flushing   . Other abnormal blood chemistry    Past Surgical History  Procedure Laterality Date  . Ectopic pregnancy surgery  1965  . Breast enhancement surgery  1969  . Cholecystectomy     . Rotator cuff repair  2013   Social History:   reports that she has quit smoking. She does not have any smokeless tobacco history on file. Her alcohol and drug histories are not on file.  Family History  Problem Relation Age of Onset  . Cancer Mother     lung  . Stroke Father     Medications: Patient's Medications  New Prescriptions   No medications on file  Previous Medications   ALPRAZOLAM (XANAX) 0.5 MG TABLET    Take 0.5 mg by mouth at bedtime as needed.   CITALOPRAM (CELEXA) 20 MG TABLET    Take one tablet once daily for depression   ESTRADIOL (ESTRACE) 1 MG TABLET    Take 1 mg by mouth daily.   FEXOFENADINE (ALLEGRA) 180 MG TABLET    Take one tablet by mouth once daily as needed for allergies   LEVOTHYROXINE (SYNTHROID, LEVOTHROID) 88 MCG TABLET    Take 88 mcg by mouth daily.   MEDROXYPROGESTERONE (PROVERA) 5 MG TABLET    Take 5 mg by mouth daily.   SIMVASTATIN (ZOCOR) 40 MG TABLET    Take one tablet once daily for cholesterol   TELMISARTAN (MICARDIS) 40 MG TABLET    Take 40 mg by mouth daily.   ZOLPIDEM (AMBIEN) 10 MG TABLET    Take 10 mg by mouth at bedtime as needed.  Modified Medications   No medications on file  Discontinued Medications   ATORVASTATIN (LIPITOR) 20 MG TABLET    Take 20 mg by mouth daily.   CETIRIZINE (ZYRTEC) 10 MG TABLET    Take 10 mg by mouth daily.   ESOMEPRAZOLE (NEXIUM) 40 MG CAPSULE    Take 40 mg by mouth daily before breakfast.   GUAIFENESIN (MUCINEX) 600 MG 12 HR TABLET    Take 1,200 mg by mouth 2 (two) times daily.   OMEGA-3 FATTY ACIDS (FISH OIL) 1000 MG CPDR    Take one tablet once daily with a meal for cholesterol   Physical Exam: Filed Vitals:   02/11/13 1356  BP: 106/60  Pulse: 71  Temp: 97.7 F (36.5 C)  TempSrc: Oral  Resp: 12  Height: 4' 9.5" (1.461 m)  Weight: 121 lb (54.885 kg)  SpO2: 97%  Physical Exam  Constitutional: She is oriented to person, place, and time. She appears well-developed and well-nourished. No  distress.  HENT:  Head: Normocephalic and atraumatic.  Right Ear: External ear normal.  Left Ear: External ear normal.  Mouth/Throat: Oropharyngeal exudate present.  Eyes: Conjunctivae and EOM are normal. Pupils are equal, round, and reactive to light. Right eye exhibits no discharge. Left eye exhibits no discharge.  Cardiovascular: Normal rate, regular rhythm, normal heart sounds and intact distal pulses.   Pulmonary/Chest: Effort normal and breath sounds normal. No respiratory distress.  Abdominal: Soft. Bowel sounds are normal. There is no tenderness.  Musculoskeletal: Normal range of motion.  Lymphadenopathy:    She has no cervical adenopathy.  Neurological: She is alert and oriented to person, place, and time.    Labs reviewed: Basic Metabolic Panel:  Recent Labs  43/32/95 1122  NA 142  K 5.0  CL 104  CO2 28  GLUCOSE 94  BUN 17  CREATININE 0.74  CALCIUM 9.5  TSH 1.430   CBC:  Recent Labs  01/31/13 1122  WBC 5.1  NEUTROABS 2.5  HGB 12.5  HCT 36.9  MCV 93   Assessment/Plan 1. Prediabetes -advised to keep track of glucose readings fasting each morning - glucose blood (FREESTYLE LITE) test strip; Check glucose daily.  Dispense: 100 each; Refill: 12 - Lancets (FREESTYLE) lancets; Check glucose daily.  Dispense: 100 each; Refill: 12 -encouraged her to continue her exercise regimen and also to follow low carb diet 2. Depressive disorder, not elsewhere classified -stable, cont celexa - citalopram (CELEXA) 20 MG tablet; Take one tablet once daily for depression  Dispense: 30 tablet; Refill: 3 3. Hypothyroidism -stable, cont synthroid - levothyroxine (SYNTHROID, LEVOTHROID) 88 MCG tablet; Take 1 tablet (88 mcg total) by mouth daily.  Dispense: 30 tablet; Refill: 3 4. Seasonal allergies --does not seem ill, but allergies are quite bothersome especially nasal congestion--discussed proper use of nasal spray--away from septum - fluticasone (FLONASE) 50 MCG/ACT nasal  spray; Place 2 sprays into the nose daily.  Dispense: 16 g; Refill: 6

## 2013-02-11 NOTE — Patient Instructions (Signed)
nettipot flonase Refilled synthroid and citalopram mucinex if congestion gets worse Warm humidity Tylenol for fever or pain Call if fevers develop or you aren't getting better in a week or so.

## 2013-02-24 ENCOUNTER — Emergency Department (INDEPENDENT_AMBULATORY_CARE_PROVIDER_SITE_OTHER)
Admission: EM | Admit: 2013-02-24 | Discharge: 2013-02-24 | Disposition: A | Discharge status: Home / Self Care | Payer: Medicare Other | Source: Home / Self Care | Attending: Family Medicine | Admitting: Family Medicine

## 2013-02-24 ENCOUNTER — Encounter (HOSPITAL_COMMUNITY): Payer: Self-pay

## 2013-02-24 DIAGNOSIS — N3000 Acute cystitis without hematuria: Secondary | ICD-10-CM | POA: Diagnosis not present

## 2013-02-24 LAB — POCT URINALYSIS DIP (DEVICE)
Bilirubin Urine: NEGATIVE
Glucose, UA: NEGATIVE mg/dL
Ketones, ur: NEGATIVE mg/dL
Nitrite: POSITIVE — AB

## 2013-02-24 MED ORDER — CEPHALEXIN 500 MG PO CAPS: 500.0000 mg | ORAL_CAPSULE | Freq: Two times a day (BID) | ORAL | Status: DC

## 2013-02-24 MED ORDER — PHENAZOPYRIDINE HCL 200 MG PO TABS: 200.0000 mg | ORAL_TABLET | Freq: Three times a day (TID) | ORAL | Status: DC

## 2013-02-24 NOTE — ED Notes (Signed)
C/o UTI sx for approx one day

## 2013-02-24 NOTE — ED Provider Notes (Signed)
History     CSN: [id number]  Arrival date & time 02/24/13  1127   First MD Initiated Contact with Patient 02/24/13 1226      Chief Complaint  Patient presents with  . Urinary Tract Infection    (Consider location/radiation/quality/duration/timing/severity/associated sxs/prior treatment) HPI Comments: 67 year old female with history of hypothyroidism and glucose intolerance among other comorbidities. Here complaining of burning on urination and tenesmus since yesterday. Denies fever or chills. No abdominal pain. No vaginal bleeding or vaginal discharge. Patient states that she is sexually active and ran out of her water-based lubricant before last intercourse. States her last intercourse was painful and started with urinary symptoms few hours after. Denies headache, no nausea or vomiting. No back pain.   Past Medical History  Diagnosis Date  . Diverticulosis   . Sigmoid polyp   . Colon polyp     Tubular Adenoma  . Colon polyp, hyperplastic   . Internal hemorrhoid   . Thyroid disease   . Hyperlipidemia   . Anxiety   . Hypertension   . Hypertonicity of bladder   . Insomnia, unspecified   . Flushing   . Other abnormal blood chemistry     Past Surgical History  Procedure Laterality Date  . Ectopic pregnancy surgery  1965  . Breast enhancement surgery  1969  . Cholecystectomy    . Rotator cuff repair  2013    Family History  Problem Relation Age of Onset  . Cancer Mother     lung  . Stroke Father     History  Substance Use Topics  . Smoking status: Former Games developer  . Smokeless tobacco: Not on file  . Alcohol Use: Not on file    OB History   Grav Para Term Preterm Abortions TAB SAB Ect Mult Living                  Review of Systems  Constitutional: Negative for fever, chills, diaphoresis, appetite change and fatigue.  Gastrointestinal: Negative for nausea, vomiting, abdominal pain and diarrhea.  Genitourinary: Positive for dysuria and frequency. Negative for  hematuria, vaginal bleeding, vaginal discharge and pelvic pain.  Musculoskeletal: Negative for back pain.  Skin: Negative for rash.  Neurological: Negative for dizziness and headaches.    Allergies  Review of patient's allergies indicates no known allergies.  Home Medications   Current Outpatient Rx  Name  Route  Sig  Dispense  Refill  . ALPRAZolam (XANAX) 0.5 MG tablet   Oral   Take 0.5 mg by mouth at bedtime as needed.         . citalopram (CELEXA) 20 MG tablet      Take one tablet once daily for depression   30 tablet   3   . estradiol (ESTRACE) 1 MG tablet   Oral   Take 1 mg by mouth daily.         . fexofenadine (ALLEGRA) 180 MG tablet      Take one tablet by mouth once daily as needed for allergies         . fluticasone (FLONASE) 50 MCG/ACT nasal spray   Nasal   Place 2 sprays into the nose daily.   16 g   6   . glucose blood (FREESTYLE LITE) test strip      Check glucose daily.   100 each   12   . Lancets (FREESTYLE) lancets      Check glucose daily.   100 each   12   .  levothyroxine (SYNTHROID, LEVOTHROID) 88 MCG tablet   Oral   Take 1 tablet (88 mcg total) by mouth daily.   30 tablet   3   . medroxyPROGESTERone (PROVERA) 5 MG tablet   Oral   Take 5 mg by mouth daily.         . simvastatin (ZOCOR) 40 MG tablet      Take one tablet once daily for cholesterol         . telmisartan (MICARDIS) 40 MG tablet   Oral   Take 40 mg by mouth daily.         Marland Kitchen zolpidem (AMBIEN) 10 MG tablet   Oral   Take 10 mg by mouth at bedtime as needed.         . cephALEXin (KEFLEX) 500 MG capsule   Oral   Take 1 capsule (500 mg total) by mouth 2 (two) times daily.   20 capsule   0   . phenazopyridine (PYRIDIUM) 200 MG tablet   Oral   Take 1 tablet (200 mg total) by mouth 3 (three) times daily.   6 tablet   0     BP 96/77  Pulse 63  Temp(Src) 97.8 F (36.6 C) (Oral)  Resp 16  SpO2 98%  Physical Exam  Nursing note and vitals  reviewed. Constitutional: She is oriented to person, place, and time. She appears well-developed and well-nourished. No distress.  Eyes: No scleral icterus.  Cardiovascular: Normal heart sounds.   Pulmonary/Chest: Breath sounds normal.  Abdominal: Soft. She exhibits no distension. There is no tenderness.  No CVT bilateral  Lymphadenopathy:    She has no cervical adenopathy.  Neurological: She is alert and oriented to person, place, and time.  Skin: No rash noted. She is not diaphoretic.    ED Course  Procedures (including critical care time)  Labs Reviewed  POCT URINALYSIS DIP (DEVICE) - Abnormal; Notable for the following:    Hgb urine dipstick LARGE (*)    Protein, ur 100 (*)    Nitrite POSITIVE (*)    Leukocytes, UA SMALL (*)    All other components within normal limits  URINE CULTURE   No results found.   1. Cystitis, acute       MDM  Treated with palladium and cephalexin.  Urine culture pending at the time of discharge. Supportive care and red flags that should prompt her return to medical attention discussed with patient and provided in writing.  Sharin Grave, MD 02/27/13 [phone]

## 2013-02-27 LAB — URINE CULTURE: Colony Count: 30000

## 2013-02-27 NOTE — ED Notes (Signed)
Urine culture: 30,000 colonies E. Coli.  Pt. adequately treated with Keflex. Vassie Moselle 02/27/2013

## 2013-04-03 ENCOUNTER — Other Ambulatory Visit: Payer: Self-pay | Admitting: *Deleted

## 2013-04-18 ENCOUNTER — Other Ambulatory Visit: Payer: Self-pay | Admitting: Geriatric Medicine

## 2013-04-18 MED ORDER — ALPRAZOLAM 0.5 MG PO TABS: ORAL_TABLET | ORAL | Status: DC

## 2013-04-26 ENCOUNTER — Other Ambulatory Visit: Payer: Self-pay | Admitting: Geriatric Medicine

## 2013-04-26 MED ORDER — SIMVASTATIN 40 MG PO TABS: 40.0000 mg | ORAL_TABLET | Freq: Every day | ORAL | Status: DC

## 2013-05-10 ENCOUNTER — Other Ambulatory Visit: Payer: Self-pay | Admitting: Internal Medicine

## 2013-05-27 ENCOUNTER — Encounter: Payer: Self-pay | Admitting: Internal Medicine

## 2013-05-27 ENCOUNTER — Ambulatory Visit (INDEPENDENT_AMBULATORY_CARE_PROVIDER_SITE_OTHER): Payer: Medicare Other | Admitting: Internal Medicine

## 2013-05-27 VITALS — BP 138/82 | HR 82 | Temp 98.2°F | Resp 16 | Wt 124.2 lb

## 2013-05-27 DIAGNOSIS — J309 Allergic rhinitis, unspecified: Secondary | ICD-10-CM

## 2013-05-27 DIAGNOSIS — J302 Other seasonal allergic rhinitis: Secondary | ICD-10-CM

## 2013-05-27 DIAGNOSIS — F32A Depression, unspecified: Secondary | ICD-10-CM | POA: Insufficient documentation

## 2013-05-27 DIAGNOSIS — R7303 Prediabetes: Secondary | ICD-10-CM | POA: Insufficient documentation

## 2013-05-27 DIAGNOSIS — R7309 Other abnormal glucose: Secondary | ICD-10-CM | POA: Diagnosis not present

## 2013-05-27 DIAGNOSIS — E039 Hypothyroidism, unspecified: Secondary | ICD-10-CM | POA: Insufficient documentation

## 2013-05-27 DIAGNOSIS — I10 Essential (primary) hypertension: Secondary | ICD-10-CM

## 2013-05-27 DIAGNOSIS — E785 Hyperlipidemia, unspecified: Secondary | ICD-10-CM | POA: Insufficient documentation

## 2013-05-27 DIAGNOSIS — F329 Major depressive disorder, single episode, unspecified: Secondary | ICD-10-CM

## 2013-05-27 DIAGNOSIS — F3289 Other specified depressive episodes: Secondary | ICD-10-CM | POA: Diagnosis not present

## 2013-05-27 NOTE — Progress Notes (Signed)
Patient ID: Kelli [name], female   DOB: 1945-11-27, 67 y.o.   MRN: [id number] Location:  [facility] No Known Allergies  Chief Complaint  Patient presents with  . Follow-up    HPI: Patient is a 67 y.o. white female seen in the office today for medical mgt of acute illnesses.   Her glucose readings are 100-110 in the morning.  Few postprandial readings have been 105 during the day.  Exercises 3 days a week.    Had some teeth extractions so her food choices are limited.  She is not ready to get dentures yet at this point.    Mood worse recently--had some problems with Walter--just today found out he was cheating.  Is taking xanax daily and celexa.  Sleeping well with ambien.    Allergies well controlled at present.  Ask about allegra with flonase if allergies resume in fall.  Dr. Tenny Craw prescribes her hormones.  Had hot flashes at 67.  Is afraid to come off hormones despite risks of them.  Review of Systems:  Review of Systems  Constitutional: Negative for weight loss.  Respiratory: Negative for shortness of breath.   Cardiovascular: Negative for chest pain.  Gastrointestinal: Negative for nausea, vomiting, abdominal pain, diarrhea and constipation.  Genitourinary: Negative for dysuria, urgency and frequency.  Musculoskeletal: Negative for falls.  Neurological: Negative for dizziness and loss of consciousness.  Psychiatric/Behavioral: Positive for depression. The patient is nervous/anxious. The patient does not have insomnia.    Past Medical History  Diagnosis Date  . Diverticulosis   . Sigmoid polyp   . Colon polyp     Tubular Adenoma  . Colon polyp, hyperplastic   . Internal hemorrhoid   . Thyroid disease   . Hyperlipidemia   . Anxiety   . Hypertension   . Hypertonicity of bladder   . Insomnia, unspecified   . Flushing   . Other abnormal blood chemistry     Past Surgical History  Procedure Laterality Date  . Ectopic  pregnancy surgery  1965  . Breast enhancement surgery  1969  . Cholecystectomy    . Rotator cuff repair  2013    Social History:   reports that she has quit smoking. She does not have any smokeless tobacco history on file. Her alcohol and drug histories are not on file.  Family History  Problem Relation Age of Onset  . Cancer Mother     lung  . Stroke Father     Medications: Patient's Medications  New Prescriptions   No medications on file  Previous Medications   ALPRAZOLAM (XANAX) 0.5 MG TABLET    Take one to two tablets by mouth as needed for 30 days.   CEPHALEXIN (KEFLEX) 500 MG CAPSULE    Take 1 capsule (500 mg total) by mouth 2 (two) times daily.   CITALOPRAM (CELEXA) 20 MG TABLET    Take one tablet once daily for depression   ESTRADIOL (ESTRACE) 1 MG TABLET    Take 1 mg by mouth daily.   FLUTICASONE (FLONASE) 50 MCG/ACT NASAL SPRAY    Place 2 sprays into the nose daily.   GLUCOSE BLOOD (FREESTYLE LITE) TEST STRIP    Check glucose daily.   LANCETS (FREESTYLE) LANCETS    Check glucose daily.   LEVOTHYROXINE (SYNTHROID, LEVOTHROID) 88 MCG TABLET    Take 1 tablet (88 mcg total) by mouth daily.   MEDROXYPROGESTERONE (PROVERA) 5 MG TABLET    Take 5 mg by  mouth daily.   PHENAZOPYRIDINE (PYRIDIUM) 200 MG TABLET    Take 1 tablet (200 mg total) by mouth 3 (three) times daily.   SIMVASTATIN (ZOCOR) 40 MG TABLET    Take 1 tablet (40 mg total) by mouth daily. Take one tablet once daily for cholesterol   TELMISARTAN (MICARDIS) 40 MG TABLET    Take 40 mg by mouth daily.   ZOLPIDEM (AMBIEN) 10 MG TABLET    TAKE ONE TABLET BY MOUTH AT BEDTIME AS NEEDED FOR INSOMNIA  Modified Medications   No medications on file  Discontinued Medications   FEXOFENADINE (ALLEGRA) 180 MG TABLET    Take one tablet by mouth once daily as needed for allergies   Physical Exam: Filed Vitals:   05/27/13 1335  BP: 138/82  Pulse: 82  Temp: 98.2 F (36.8 C)  TempSrc: Oral  Resp: 16  Weight: 124 lb 3.2 oz  (56.337 kg)  Physical Exam  Constitutional: She is oriented to person, place, and time. She appears well-developed and well-nourished.  HENT:  Head: Normocephalic and atraumatic.  Eyes: EOM are normal. Pupils are equal, round, and reactive to light.  Neck: Normal range of motion.  Cardiovascular: Normal rate, regular rhythm, normal heart sounds and intact distal pulses.   Pulmonary/Chest: Effort normal and breath sounds normal. No respiratory distress.  Abdominal: Soft. Bowel sounds are normal. She exhibits no distension. There is no tenderness.  Musculoskeletal: Normal range of motion. She exhibits no edema and no tenderness.  Neurological: She is alert and oriented to person, place, and time.  Skin: Skin is warm and dry.  Psychiatric:  Tearful about boyfriend cheating    Labs reviewed: Basic Metabolic Panel:  Recent Labs  96/04/54 1122  NA 142  K 5.0  CL 104  CO2 28  GLUCOSE 94  BUN 17  CREATININE 0.74  CALCIUM 9.5  TSH 1.430   CBC:  Recent Labs  01/31/13 1122  WBC 5.1  NEUTROABS 2.5  HGB 12.5  HCT 36.9  MCV 93   Lab Results  Component Value Date   HGBA1C 5.9* 01/31/2013   Assessment/Plan 1. Prediabetes -doing pretty well with her diet and exercise, stressed at present - Basic metabolic panel - Hemoglobin A1c - Lipid panel; Future - Comprehensive metabolic panel; Future - Hemoglobin A1c; Future -foot exam performed and benign  2. Depressive disorder, not elsewhere classified -worse at present as boyfriend was cheating on her--cont celexa and as needed xanax--I have advised her about the risks of xanax as she ages  67. Hypothyroidism - f/u TSH  4. Seasonal allergies -no active symptoms, resume loratadine and flonase if needed in the fall  5. Unspecified essential hypertension -bp at goal, is anxious today though  6. Hyperlipidemia -is exercising and eating properly - Lipid panel; Future  Labs/tests ordered:  Today and flp, hba1c, cmp before next  visit in 4 mos Next appt: 4mos

## 2013-05-28 LAB — CBC WITH DIFFERENTIAL/PLATELET
Basophils Absolute: 0 10*3/uL (ref 0.0–0.2)
Basos: 0 % (ref 0–3)
Eos: 0 % (ref 0–5)
Eosinophils Absolute: 0 10*3/uL (ref 0.0–0.4)
HCT: 35.8 % (ref 34.0–46.6)
Hemoglobin: 11.8 g/dL (ref 11.1–15.9)
Immature Grans (Abs): 0 10*3/uL (ref 0.0–0.1)
Immature Granulocytes: 0 % (ref 0–2)
Lymphocytes Absolute: 1.9 10*3/uL (ref 0.7–3.1)
Lymphs: 33 % (ref 14–46)
MCH: 31.7 pg (ref 26.6–33.0)
MCHC: 33 g/dL (ref 31.5–35.7)
MCV: 96 fL (ref 79–97)
Monocytes Absolute: 0.5 10*3/uL (ref 0.1–0.9)
Monocytes: 8 % (ref 4–12)
Neutrophils Absolute: 3.4 10*3/uL (ref 1.4–7.0)
Neutrophils Relative %: 59 % (ref 40–74)
RBC: 3.72 x10E6/uL — ABNORMAL LOW (ref 3.77–5.28)
RDW: 13.9 % (ref 12.3–15.4)
WBC: 5.8 10*3/uL (ref 3.4–10.8)

## 2013-05-28 LAB — BASIC METABOLIC PANEL
BUN/Creatinine Ratio: 16 (ref 11–26)
BUN: 10 mg/dL (ref 8–27)
CO2: 23 mmol/L (ref 18–29)
Calcium: 9.1 mg/dL (ref 8.6–10.2)
Chloride: 105 mmol/L (ref 97–108)
Creatinine, Ser: 0.62 mg/dL (ref 0.57–1.00)
GFR calc Af Amer: 106 mL/min/{1.73_m2} (ref >59)
GFR calc non Af Amer: 92 mL/min/{1.73_m2} (ref >59)
Glucose: 104 mg/dL — ABNORMAL HIGH (ref 65–99)
Potassium: 4 mmol/L (ref 3.5–5.2)
Sodium: 142 mmol/L (ref 134–144)

## 2013-05-28 LAB — TSH: TSH: 0.807 u[IU]/mL (ref 0.450–4.500)

## 2013-05-28 LAB — HEMOGLOBIN A1C
Est. average glucose Bld gHb Est-mCnc: 117 mg/dL
Hgb A1c MFr Bld: 5.7 % — ABNORMAL HIGH (ref 4.8–5.6)

## 2013-06-08 ENCOUNTER — Other Ambulatory Visit: Payer: Self-pay | Admitting: Internal Medicine

## 2013-06-11 ENCOUNTER — Other Ambulatory Visit: Payer: Self-pay | Admitting: Internal Medicine

## 2013-06-15 ENCOUNTER — Other Ambulatory Visit: Payer: Self-pay | Admitting: Internal Medicine

## 2013-06-20 ENCOUNTER — Encounter (INDEPENDENT_AMBULATORY_CARE_PROVIDER_SITE_OTHER): Payer: Medicare Other | Admitting: Ophthalmology

## 2013-06-20 DIAGNOSIS — E11319 Type 2 diabetes mellitus with unspecified diabetic retinopathy without macular edema: Secondary | ICD-10-CM | POA: Diagnosis not present

## 2013-06-20 DIAGNOSIS — E1139 Type 2 diabetes mellitus with other diabetic ophthalmic complication: Secondary | ICD-10-CM | POA: Diagnosis not present

## 2013-06-20 DIAGNOSIS — H35039 Hypertensive retinopathy, unspecified eye: Secondary | ICD-10-CM

## 2013-06-20 DIAGNOSIS — H43819 Vitreous degeneration, unspecified eye: Secondary | ICD-10-CM

## 2013-06-20 DIAGNOSIS — I10 Essential (primary) hypertension: Secondary | ICD-10-CM

## 2013-07-05 ENCOUNTER — Other Ambulatory Visit: Payer: Self-pay | Admitting: Internal Medicine

## 2013-07-06 ENCOUNTER — Other Ambulatory Visit: Payer: Self-pay | Admitting: Internal Medicine

## 2013-07-08 ENCOUNTER — Ambulatory Visit (INDEPENDENT_AMBULATORY_CARE_PROVIDER_SITE_OTHER): Payer: Medicare Other | Admitting: Internal Medicine

## 2013-07-08 ENCOUNTER — Encounter: Payer: Self-pay | Admitting: Internal Medicine

## 2013-07-08 VITALS — BP 124/78 | HR 67 | Temp 98.0°F | Wt 118.0 lb

## 2013-07-08 DIAGNOSIS — Z23 Encounter for immunization: Secondary | ICD-10-CM | POA: Diagnosis not present

## 2013-07-08 DIAGNOSIS — F4323 Adjustment disorder with mixed anxiety and depressed mood: Secondary | ICD-10-CM | POA: Diagnosis not present

## 2013-07-08 DIAGNOSIS — F3289 Other specified depressive episodes: Secondary | ICD-10-CM | POA: Diagnosis not present

## 2013-07-08 DIAGNOSIS — F329 Major depressive disorder, single episode, unspecified: Secondary | ICD-10-CM | POA: Diagnosis not present

## 2013-07-08 MED ORDER — ALPRAZOLAM 1 MG PO TABS: ORAL_TABLET | ORAL | Status: DC

## 2013-07-08 NOTE — Progress Notes (Signed)
Patient ID: Kelli [name], female   DOB: 07-09-1946, 67 y.o.   MRN: [id number] Location:  [facility]  No Known Allergies  Chief Complaint  Patient presents with  . Anxiety    since last ov 05/27/2013, discuss Alprazolam     HPI: Patient is a 67 y.o. white female seen in the office today for anxiety.  Not crying everyday any longer, but anxiety is bad.  Is stressed.  Initially took alprazolam for sleep.  Takes one hour before [place].  Now taking two 0.5mg  instead of one 1mg  pill and feels it doesn't work well.  Gets chest tightness with anxiety.  Avoids her ex-boyfriend as much as she can.  Is exercising.  Reading at night to fill her mind so she doesn't think about him and his new girlfriend.  Is reading self-help book on anxiety.    Previously tried to stop celexa, and could not do without it.    Has friend/neighbor who has a psychology degree that has been counseling her some.    Review of Systems:  Review of Systems  Constitutional: Negative for fever.  Eyes: Negative for blurred vision.  Respiratory: Negative for shortness of breath.   Cardiovascular: Negative for chest pain.  Gastrointestinal: Negative for constipation.  Genitourinary: Negative for dysuria.  Neurological: Negative for dizziness and headaches.  Psychiatric/Behavioral: Positive for depression. The patient is nervous/anxious and has insomnia.      Past Medical History  Diagnosis Date  . Diverticulosis   . Sigmoid polyp   . Colon polyp     Tubular Adenoma  . Colon polyp, hyperplastic   . Internal hemorrhoid   . Thyroid disease   . Hyperlipidemia   . Anxiety   . Hypertension   . Hypertonicity of bladder   . Insomnia, unspecified   . Flushing   . Other abnormal blood chemistry     Past Surgical History  Procedure Laterality Date  . Ectopic pregnancy surgery  1965  . Breast enhancement surgery  1969  . Cholecystectomy    . Rotator cuff repair  2013     Social History:   reports that she has quit smoking. She does not have any smokeless tobacco history on file. She reports that she does not drink alcohol or use illicit drugs.  Family History  Problem Relation Age of Onset  . Cancer Mother     lung  . Stroke Father     Medications: Patient's Medications  New Prescriptions   No medications on file  Previous Medications   ALPRAZOLAM (XANAX) 0.5 MG TABLET    TAKE ONE TO TWO TABLETS BY MOUTH AS NEEDED FOR 30 DAYS   CITALOPRAM (CELEXA) 20 MG TABLET    TAKE ONE TABLET BY MOUTH ONCE DAILY FOR DEPRESSION   ESTRADIOL (ESTRACE) 1 MG TABLET    Take 1 mg by mouth daily.   FLUTICASONE (FLONASE) 50 MCG/ACT NASAL SPRAY    Place 2 sprays into the nose daily.   GLUCOSE BLOOD (FREESTYLE LITE) TEST STRIP    Check glucose daily.   LANCETS (FREESTYLE) LANCETS    Check glucose daily.   LEVOTHYROXINE (SYNTHROID, LEVOTHROID) 88 MCG TABLET    TAKE ONE TABLET BY MOUTH ONCE DAILY   MEDROXYPROGESTERONE (PROVERA) 5 MG TABLET    Take 5 mg by mouth daily.   SIMVASTATIN (ZOCOR) 40 MG TABLET    Take 1 tablet (40 mg total) by mouth daily. Take one tablet once daily for cholesterol  TELMISARTAN (MICARDIS) 40 MG TABLET    Take 40 mg by mouth daily.   ZOLPIDEM (AMBIEN) 10 MG TABLET    TAKE ONE TABLET BY MOUTH AT BEDTIME AS NEEDED FOR ISOMNIA  Modified Medications   No medications on file  Discontinued Medications   CEPHALEXIN (KEFLEX) 500 MG CAPSULE    Take 1 capsule (500 mg total) by mouth 2 (two) times daily.   PHENAZOPYRIDINE (PYRIDIUM) 200 MG TABLET    Take 1 tablet (200 mg total) by mouth 3 (three) times daily.     Physical Exam: Filed Vitals:   07/08/13 1426  BP: 124/78  Pulse: 67  Temp: 98 F (36.7 C)  TempSrc: Oral  Weight: 118 lb (53.524 kg)  SpO2: 96%  Physical Exam  Constitutional: She is oriented to person, place, and time.  Cardiovascular: Normal rate, regular rhythm, normal heart sounds and intact distal pulses.   Pulmonary/Chest:  Effort normal and breath sounds normal. No respiratory distress.  Abdominal: Soft. Bowel sounds are normal. She exhibits no distension.  Neurological: She is alert and oriented to person, place, and time.  Psychiatric:  Anxious, jittery, tearful    Labs reviewed: Basic Metabolic Panel:  Recent Labs  16/10/96 1122 05/27/13 1418  NA 142 142  K 5.0 4.0  CL 104 105  CO2 28 23  GLUCOSE 94 104*  BUN 17 10  CREATININE 0.74 0.62  CALCIUM 9.5 9.1  TSH 1.430 0.807  CBC:  Recent Labs  01/31/13 1122 05/27/13 1418  WBC 5.1 5.8  NEUTROABS 2.5 3.4  HGB 12.5 11.8  HCT 36.9 35.8  MCV 93 96   Lab Results  Component Value Date   HGBA1C 5.7* 05/27/2013   Assessment/Plan 1. Depressive disorder, not elsewhere classified -has been worse since her last visit when she had discovered her boyfriend was cheating on her--she then broke up with him and now can't stop thinking about him and can't seem to avoid seeing him either -continue celexa b/c she feels it is helping  2. Adjustment disorder with mixed anxiety and depressed mood -will temporarily increase xanax to 1mg  at bedtime and 1/2 tablet during the day on bad days #60 given  Next appt:  6 wks

## 2013-07-12 DIAGNOSIS — H52229 Regular astigmatism, unspecified eye: Secondary | ICD-10-CM | POA: Diagnosis not present

## 2013-07-12 DIAGNOSIS — H519 Unspecified disorder of binocular movement: Secondary | ICD-10-CM | POA: Diagnosis not present

## 2013-08-02 ENCOUNTER — Other Ambulatory Visit: Payer: Self-pay

## 2013-08-02 DIAGNOSIS — F3289 Other specified depressive episodes: Secondary | ICD-10-CM

## 2013-08-02 DIAGNOSIS — F4323 Adjustment disorder with mixed anxiety and depressed mood: Secondary | ICD-10-CM

## 2013-08-02 DIAGNOSIS — F329 Major depressive disorder, single episode, unspecified: Secondary | ICD-10-CM

## 2013-08-02 MED ORDER — ALPRAZOLAM 1 MG PO TABS: ORAL_TABLET | ORAL | Status: DC

## 2013-08-02 NOTE — Telephone Encounter (Signed)
RX was received manually, responded by call into the pharmacy

## 2013-08-10 ENCOUNTER — Other Ambulatory Visit: Payer: Self-pay | Admitting: Internal Medicine

## 2013-08-11 ENCOUNTER — Other Ambulatory Visit: Payer: Self-pay | Admitting: Internal Medicine

## 2013-09-01 ENCOUNTER — Other Ambulatory Visit: Payer: Self-pay | Admitting: Internal Medicine

## 2013-09-02 ENCOUNTER — Other Ambulatory Visit: Payer: Self-pay | Admitting: Internal Medicine

## 2013-09-05 ENCOUNTER — Ambulatory Visit: Payer: Medicare Other | Admitting: Internal Medicine

## 2013-09-06 ENCOUNTER — Ambulatory Visit: Payer: Medicare Other | Admitting: Internal Medicine

## 2013-09-20 ENCOUNTER — Other Ambulatory Visit: Payer: Self-pay | Admitting: *Deleted

## 2013-09-20 MED ORDER — MOMETASONE FUROATE 0.1 % EX CREA: TOPICAL_CREAM | CUTANEOUS | Status: DC

## 2013-09-20 NOTE — Telephone Encounter (Signed)
oked to add to medication list per Dr. Renato Gails

## 2013-10-08 ENCOUNTER — Other Ambulatory Visit: Payer: Self-pay | Admitting: *Deleted

## 2013-10-08 MED ORDER — TELMISARTAN 40 MG PO TABS: ORAL_TABLET | ORAL | Status: DC

## 2013-10-09 DIAGNOSIS — M9420 Chondromalacia, unspecified site: Secondary | ICD-10-CM | POA: Diagnosis not present

## 2013-10-15 DIAGNOSIS — M9420 Chondromalacia, unspecified site: Secondary | ICD-10-CM | POA: Diagnosis not present

## 2013-10-22 DIAGNOSIS — M25519 Pain in unspecified shoulder: Secondary | ICD-10-CM | POA: Diagnosis not present

## 2013-10-22 DIAGNOSIS — M9420 Chondromalacia, unspecified site: Secondary | ICD-10-CM | POA: Diagnosis not present

## 2013-10-24 DIAGNOSIS — M9420 Chondromalacia, unspecified site: Secondary | ICD-10-CM | POA: Diagnosis not present

## 2013-10-24 DIAGNOSIS — M25519 Pain in unspecified shoulder: Secondary | ICD-10-CM | POA: Diagnosis not present

## 2013-10-30 DIAGNOSIS — H519 Unspecified disorder of binocular movement: Secondary | ICD-10-CM | POA: Diagnosis not present

## 2013-11-21 ENCOUNTER — Other Ambulatory Visit: Payer: Self-pay | Admitting: Internal Medicine

## 2013-11-22 ENCOUNTER — Other Ambulatory Visit: Payer: Self-pay | Admitting: Internal Medicine

## 2013-12-03 ENCOUNTER — Other Ambulatory Visit: Payer: Medicare Other

## 2013-12-05 DIAGNOSIS — Z1289 Encounter for screening for malignant neoplasm of other sites: Secondary | ICD-10-CM | POA: Diagnosis not present

## 2013-12-06 ENCOUNTER — Encounter: Payer: Medicare Other | Admitting: Internal Medicine

## 2013-12-08 ENCOUNTER — Other Ambulatory Visit: Payer: Self-pay | Admitting: Internal Medicine

## 2013-12-21 ENCOUNTER — Other Ambulatory Visit: Payer: Self-pay | Admitting: Internal Medicine

## 2013-12-24 ENCOUNTER — Other Ambulatory Visit: Payer: Self-pay | Admitting: *Deleted

## 2013-12-24 ENCOUNTER — Other Ambulatory Visit: Payer: Medicare Other

## 2013-12-24 DIAGNOSIS — E785 Hyperlipidemia, unspecified: Secondary | ICD-10-CM | POA: Diagnosis not present

## 2013-12-24 DIAGNOSIS — R7309 Other abnormal glucose: Secondary | ICD-10-CM | POA: Diagnosis not present

## 2013-12-24 DIAGNOSIS — R7303 Prediabetes: Secondary | ICD-10-CM

## 2013-12-24 MED ORDER — ALPRAZOLAM 1 MG PO TABS: ORAL_TABLET | ORAL | Status: DC

## 2013-12-24 NOTE — Telephone Encounter (Signed)
rx printed and to be faxed to [facility] @ [phone]

## 2013-12-25 LAB — COMPREHENSIVE METABOLIC PANEL
ALT: 12 IU/L (ref 0–32)
AST: 14 IU/L (ref 0–40)
Albumin/Globulin Ratio: 1.5 (ref 1.1–2.5)
Albumin: 4.2 g/dL (ref 3.5–4.8)
Alkaline Phosphatase: 53 IU/L (ref 39–117)
BUN/Creatinine Ratio: 18 (ref 11–26)
BUN: 14 mg/dL (ref 8–27)
CO2: 24 mmol/L (ref 18–29)
Calcium: 9.2 mg/dL (ref 8.7–10.3)
Chloride: 105 mmol/L (ref 97–108)
Creatinine, Ser: 0.76 mg/dL (ref 0.57–1.00)
GFR calc Af Amer: 92 mL/min/{1.73_m2} (ref >59)
GFR calc non Af Amer: 80 mL/min/{1.73_m2} (ref >59)
Globulin, Total: 2.8 g/dL (ref 1.5–4.5)
Glucose: 92 mg/dL (ref 65–99)
Potassium: 4.6 mmol/L (ref 3.5–5.2)
Sodium: 143 mmol/L (ref 134–144)
Total Bilirubin: <0.2 mg/dL (ref 0.0–1.2)
Total Protein: 7 g/dL (ref 6.0–8.5)

## 2013-12-25 LAB — LIPID PANEL
Chol/HDL Ratio: 3.5 ratio units (ref 0.0–4.4)
Cholesterol, Total: 212 mg/dL — ABNORMAL HIGH (ref 100–199)
HDL: 60 mg/dL (ref >39)
LDL Calculated: 129 mg/dL — ABNORMAL HIGH (ref 0–99)
Triglycerides: 113 mg/dL (ref 0–149)
VLDL Cholesterol Cal: 23 mg/dL (ref 5–40)

## 2013-12-25 LAB — HEMOGLOBIN A1C
Est. average glucose Bld gHb Est-mCnc: 123 mg/dL
Hgb A1c MFr Bld: 5.9 % — ABNORMAL HIGH (ref 4.8–5.6)

## 2013-12-26 ENCOUNTER — Ambulatory Visit (INDEPENDENT_AMBULATORY_CARE_PROVIDER_SITE_OTHER): Payer: Medicare Other | Admitting: Internal Medicine

## 2013-12-26 ENCOUNTER — Encounter: Payer: Self-pay | Admitting: Internal Medicine

## 2013-12-26 VITALS — BP 114/68 | HR 62 | Temp 98.0°F | Resp 16 | Ht <= 58 in | Wt 124.0 lb

## 2013-12-26 DIAGNOSIS — J302 Other seasonal allergic rhinitis: Secondary | ICD-10-CM

## 2013-12-26 DIAGNOSIS — E785 Hyperlipidemia, unspecified: Secondary | ICD-10-CM

## 2013-12-26 DIAGNOSIS — R7303 Prediabetes: Secondary | ICD-10-CM

## 2013-12-26 DIAGNOSIS — E782 Mixed hyperlipidemia: Secondary | ICD-10-CM | POA: Insufficient documentation

## 2013-12-26 DIAGNOSIS — E039 Hypothyroidism, unspecified: Secondary | ICD-10-CM

## 2013-12-26 DIAGNOSIS — I10 Essential (primary) hypertension: Secondary | ICD-10-CM | POA: Diagnosis not present

## 2013-12-26 DIAGNOSIS — K5909 Other constipation: Secondary | ICD-10-CM

## 2013-12-26 DIAGNOSIS — F4323 Adjustment disorder with mixed anxiety and depressed mood: Secondary | ICD-10-CM

## 2013-12-26 DIAGNOSIS — K5900 Constipation, unspecified: Secondary | ICD-10-CM

## 2013-12-26 DIAGNOSIS — R7309 Other abnormal glucose: Secondary | ICD-10-CM

## 2013-12-26 DIAGNOSIS — J309 Allergic rhinitis, unspecified: Secondary | ICD-10-CM

## 2013-12-26 MED ORDER — CITALOPRAM HYDROBROMIDE 40 MG PO TABS: 40.0000 mg | ORAL_TABLET | Freq: Every day | ORAL | Status: DC

## 2013-12-26 MED ORDER — POLYETHYLENE GLYCOL 3350 17 GM/SCOOP PO POWD: 17.0000 g | Freq: Two times a day (BID) | ORAL | Status: DC | PRN

## 2013-12-26 MED ORDER — FLUTICASONE PROPIONATE 50 MCG/ACT NA SUSP: 2.0000 | Freq: Every day | NASAL | Status: DC

## 2013-12-26 NOTE — Progress Notes (Signed)
Failed clock drawing  

## 2013-12-26 NOTE — Progress Notes (Signed)
Patient ID: Kelli [name], female   DOB: 05/17/1946, 68 y.o.   MRN: [id number]   Location:  [facility]  Code Status: full code  No Known Allergies  Chief Complaint  Patient presents with  . Annual Exam    physical & discuss labs (printed), last EKG 06/15/11  . other    HPI: Patient is a 68 y.o. white female seen in the office today for her annual physical/exam.  Not much new.  Things are about the same.    Admits she has been eating some more sweets lately with hba1c up a touch but still in the prediabetic range.    Is having diarrhea today.  Has gone 2x and had to go a 3rd when I entered the room.  Had strawberries yesterday and may be cause.  Feels better after third time.    Has been feeling depressed--daughter hurt herself at work and pt has been driving her around and helping her financially--can't help her anymore.  Has not been to the gym since the snow either.  Says she feels different and hates to see she has lost progress.  Is ok when out among people, but doesn't feel like talking to people once she's home.  Takes citalopram, but wonders if she needs it increased.dose would help.  PHQ9 11.  Energy is low.    Sees Dr. Annamaria Boots who puts prisms in her glasses.   Review of Systems:  Review of Systems  Constitutional: Positive for malaise/fatigue. Negative for fever and weight loss.  HENT: Negative for congestion.        Uses nasal spray  Eyes: Positive for blurred vision.       Wears glasses--got new ones in May 2014 and went back a month later and was told a change wouldn't make enough difference to be worth the cost;  Has prisms in her glasses  Respiratory: Negative for shortness of breath.   Cardiovascular: Negative for chest pain, palpitations and leg swelling.  Gastrointestinal: Positive for diarrhea.  Skin: Negative for rash.  Neurological: Negative for weakness and headaches.     Past Medical History  Diagnosis Date  .  Diverticulosis   . Sigmoid polyp   . Colon polyp     Tubular Adenoma  . Colon polyp, hyperplastic   . Internal hemorrhoid   . Thyroid disease   . Hyperlipidemia   . Anxiety   . Hypertension   . Hypertonicity of bladder   . Insomnia, unspecified   . Flushing   . Other abnormal blood chemistry     Past Surgical History  Procedure Laterality Date  . Ectopic pregnancy surgery  1965  . Breast enhancement surgery  1969  . Cholecystectomy    . Rotator cuff repair  2013    Social History:   reports that she has quit smoking. She does not have any smokeless tobacco history on file. She reports that she does not drink alcohol or use illicit drugs.  Family History  Problem Relation Age of Onset  . Cancer Mother     lung  . Stroke Father     Medications: Patient's Medications  New Prescriptions   No medications on file  Previous Medications   ALPRAZOLAM (XANAX) 1 MG TABLET    TAKE ONE TABLET BY MOUTH ONE HOUR BEFORE BEDTIME AND ONE-HALF TABLET SECOND TIME AS NEEDED   CITALOPRAM (CELEXA) 20 MG TABLET    TAKE ONE TABLET BY MOUTH ONCE DAILY FOR DEPRESSION  ESTRADIOL (ESTRACE) 1 MG TABLET    Take 1 mg by mouth daily.   FLUTICASONE (FLONASE) 50 MCG/ACT NASAL SPRAY    Place 2 sprays into the nose daily.   GLUCOSE BLOOD (FREESTYLE LITE) TEST STRIP    Check glucose daily.   LANCETS (FREESTYLE) LANCETS    Check glucose daily.   LEVOTHYROXINE (SYNTHROID, LEVOTHROID) 88 MCG TABLET    TAKE ONE TABLET BY MOUTH ONCE DAILY   MEDROXYPROGESTERONE (PROVERA) 5 MG TABLET    Take 5 mg by mouth daily.   MOMETASONE (ELOCON) 0.1 % CREAM    Apply to affected area twice daily   SIMVASTATIN (ZOCOR) 40 MG TABLET    Take 1 tablet (40 mg total) by mouth daily. Take one tablet once daily for cholesterol   TELMISARTAN (MICARDIS) 40 MG TABLET    Take 1/2 tablet by mouth once daily   ZOLPIDEM (AMBIEN) 10 MG TABLET    TAKE ONE TABLET BY MOUTH AT BEDTIME AS NEEDED FOR  IN  Modified Medications   No medications  on file  Discontinued Medications   No medications on file     Physical Exam: Filed Vitals:   12/26/13 1359  BP: 114/68  Pulse: 62  Temp: 98 F (36.7 C)  TempSrc: Oral  Resp: 16  Height: 4' 9.5" (1.461 m)  Weight: 124 lb (56.246 kg)  SpO2: 99%  Physical Exam  Constitutional: She is oriented to person, place, and time. She appears well-developed and well-nourished. No distress.  HENT:  Head: Normocephalic and atraumatic.  Right Ear: External ear normal.  Left Ear: External ear normal.  Nose: Nose normal.  Mouth/Throat: Oropharynx is clear and moist.  Eyes: Conjunctivae and EOM are normal. Pupils are equal, round, and reactive to light.  glasses  Neck: Normal range of motion. Neck supple. No JVD present.  Cardiovascular: Normal rate, regular rhythm, normal heart sounds and intact distal pulses.   Pulmonary/Chest: Effort normal and breath sounds normal. Right breast exhibits no mass, no nipple discharge, no skin change and no tenderness. Left breast exhibits no mass, no nipple discharge, no skin change and no tenderness. Breasts are symmetrical.  Abdominal: Soft. Bowel sounds are normal.  Musculoskeletal: Normal range of motion. She exhibits no edema and no tenderness.  Neurological: She is alert and oriented to person, place, and time. She has normal reflexes.  Skin: Skin is warm and dry.  Hair is thinning   Labs reviewed: Basic Metabolic Panel:  Recent Labs  01/31/13 1122 05/27/13 1418 12/24/13 1504  NA 142 142 143  K 5.0 4.0 4.6  CL 104 105 105  CO2 28 23 24   GLUCOSE 94 104* 92  BUN 17 10 14   CREATININE 0.74 0.62 0.76  CALCIUM 9.5 9.1 9.2  TSH 1.430 0.807  --    Liver Function Tests:  Recent Labs  12/24/13 1504  AST 14  ALT 12  ALKPHOS 53  BILITOT <0.2  PROT 7.0  CBC:  Recent Labs  01/31/13 1122 05/27/13 1418  WBC 5.1 5.8  NEUTROABS 2.5 3.4  HGB 12.5 11.8  HCT 36.9 35.8  MCV 93 96   Lipid Panel:  Recent Labs  12/24/13 1504  HDL 60    LDLCALC 129*  TRIG 113  CHOLHDL 3.5   Lab Results  Component Value Date   HGBA1C 5.9* 12/24/2013   Assessment/Plan 1. Adjustment disorder with mixed anxiety and depressed mood -cont celexa and occasional xanax -she is having difficulty coping with a breakup where her boyfriend cheated  on her - citalopram (CELEXA) 40 MG tablet; Take 1 tablet (40 mg total) by mouth daily. For depression  Dispense: 30 tablet; Refill: 3  2. Prediabetes -sugar average is not improving on labwork -I educated the patient on a low carb diet including decreased starches, fried foods, and sweets as well as regular exercise at least 3-5 times per week.   - Lipid panel; Future - Hemoglobin A1c; Future  3. Hypothyroidism -cont synthroid--proper administration reviewed - TSH; Future  4. Seasonal allergies -cont flonase  5. Essential hypertension, benign -cont micardis, low sodium diet and exercise - CBC With differential/Platelet; Future - Basic metabolic panel; Future  6. Hyperlipidemia LDL goal < 100 -cont zocor, low fat diet and exercise  7. Chronic constipation -cont current regimen  Labs/tests ordered:   Orders Placed This Encounter  Procedures  . Lipid panel    Standing Status: Future     Number of Occurrences:      Standing Expiration Date: 12/27/2014    Order Specific Question:  Has the patient fasted?    Answer:  Yes  . Hemoglobin A1c    Standing Status: Future     Number of Occurrences:      Standing Expiration Date: 12/27/2014  . CBC With differential/Platelet    Standing Status: Future     Number of Occurrences:      Standing Expiration Date: 12/27/2014  . Basic metabolic panel    Standing Status: Future     Number of Occurrences:      Standing Expiration Date: 12/27/2014    Order Specific Question:  Has the patient fasted?    Answer:  Yes  . TSH    Standing Status: Future     Number of Occurrences:      Standing Expiration Date: 12/27/2014  MMSE 29/30.   Needs tdap Rx and  prevnar next time. Wants to set up her own mammogram Next appt:  6 mos, labs before

## 2014-01-04 ENCOUNTER — Emergency Department (HOSPITAL_COMMUNITY)
Admission: EM | Admit: 2014-01-04 | Discharge: 2014-01-04 | Disposition: A | Discharge status: Home / Self Care | Payer: Medicare Other | Attending: Emergency Medicine | Admitting: Emergency Medicine

## 2014-01-04 ENCOUNTER — Emergency Department (HOSPITAL_COMMUNITY): Payer: Medicare Other

## 2014-01-04 ENCOUNTER — Other Ambulatory Visit: Payer: Self-pay

## 2014-01-04 ENCOUNTER — Encounter (HOSPITAL_COMMUNITY): Payer: Self-pay | Admitting: Emergency Medicine

## 2014-01-04 DIAGNOSIS — IMO0002 Reserved for concepts with insufficient information to code with codable children: Secondary | ICD-10-CM | POA: Diagnosis not present

## 2014-01-04 DIAGNOSIS — M542 Cervicalgia: Secondary | ICD-10-CM | POA: Diagnosis not present

## 2014-01-04 DIAGNOSIS — T424X4A Poisoning by benzodiazepines, undetermined, initial encounter: Secondary | ICD-10-CM | POA: Insufficient documentation

## 2014-01-04 DIAGNOSIS — R4182 Altered mental status, unspecified: Secondary | ICD-10-CM | POA: Insufficient documentation

## 2014-01-04 DIAGNOSIS — E079 Disorder of thyroid, unspecified: Secondary | ICD-10-CM | POA: Insufficient documentation

## 2014-01-04 DIAGNOSIS — Z79899 Other long term (current) drug therapy: Secondary | ICD-10-CM | POA: Insufficient documentation

## 2014-01-04 DIAGNOSIS — S99919A Unspecified injury of unspecified ankle, initial encounter: Secondary | ICD-10-CM | POA: Diagnosis not present

## 2014-01-04 DIAGNOSIS — F411 Generalized anxiety disorder: Secondary | ICD-10-CM | POA: Diagnosis not present

## 2014-01-04 DIAGNOSIS — Z87891 Personal history of nicotine dependence: Secondary | ICD-10-CM | POA: Insufficient documentation

## 2014-01-04 DIAGNOSIS — T4271XA Poisoning by unspecified antiepileptic and sedative-hypnotic drugs, accidental (unintentional), initial encounter: Secondary | ICD-10-CM

## 2014-01-04 DIAGNOSIS — M25569 Pain in unspecified knee: Secondary | ICD-10-CM | POA: Diagnosis not present

## 2014-01-04 DIAGNOSIS — T43201A Poisoning by unspecified antidepressants, accidental (unintentional), initial encounter: Secondary | ICD-10-CM | POA: Insufficient documentation

## 2014-01-04 DIAGNOSIS — T07XXXA Unspecified multiple injuries, initial encounter: Secondary | ICD-10-CM | POA: Diagnosis not present

## 2014-01-04 DIAGNOSIS — R404 Transient alteration of awareness: Secondary | ICD-10-CM | POA: Diagnosis not present

## 2014-01-04 DIAGNOSIS — T50905A Adverse effect of unspecified drugs, medicaments and biological substances, initial encounter: Secondary | ICD-10-CM

## 2014-01-04 DIAGNOSIS — R6889 Other general symptoms and signs: Secondary | ICD-10-CM | POA: Diagnosis not present

## 2014-01-04 DIAGNOSIS — Y92009 Unspecified place in unspecified non-institutional (private) residence as the place of occurrence of the external cause: Secondary | ICD-10-CM | POA: Insufficient documentation

## 2014-01-04 DIAGNOSIS — Z87448 Personal history of other diseases of urinary system: Secondary | ICD-10-CM | POA: Diagnosis not present

## 2014-01-04 DIAGNOSIS — T43294A Poisoning by other antidepressants, undetermined, initial encounter: Secondary | ICD-10-CM | POA: Diagnosis not present

## 2014-01-04 DIAGNOSIS — I10 Essential (primary) hypertension: Secondary | ICD-10-CM | POA: Insufficient documentation

## 2014-01-04 DIAGNOSIS — Y939 Activity, unspecified: Secondary | ICD-10-CM | POA: Insufficient documentation

## 2014-01-04 DIAGNOSIS — S298XXA Other specified injuries of thorax, initial encounter: Secondary | ICD-10-CM | POA: Diagnosis not present

## 2014-01-04 DIAGNOSIS — T424X1A Poisoning by benzodiazepines, accidental (unintentional), initial encounter: Secondary | ICD-10-CM | POA: Insufficient documentation

## 2014-01-04 DIAGNOSIS — Z791 Long term (current) use of non-steroidal anti-inflammatories (NSAID): Secondary | ICD-10-CM | POA: Insufficient documentation

## 2014-01-04 DIAGNOSIS — E785 Hyperlipidemia, unspecified: Secondary | ICD-10-CM | POA: Insufficient documentation

## 2014-01-04 DIAGNOSIS — T426X1A Poisoning by other antiepileptic and sedative-hypnotic drugs, accidental (unintentional), initial encounter: Secondary | ICD-10-CM | POA: Diagnosis not present

## 2014-01-04 DIAGNOSIS — S99929A Unspecified injury of unspecified foot, initial encounter: Secondary | ICD-10-CM | POA: Diagnosis not present

## 2014-01-04 DIAGNOSIS — Z8719 Personal history of other diseases of the digestive system: Secondary | ICD-10-CM | POA: Diagnosis not present

## 2014-01-04 DIAGNOSIS — X58XXXA Exposure to other specified factors, initial encounter: Secondary | ICD-10-CM | POA: Insufficient documentation

## 2014-01-04 DIAGNOSIS — R51 Headache: Secondary | ICD-10-CM | POA: Diagnosis not present

## 2014-01-04 DIAGNOSIS — Z860100 Personal history of colon polyps, unspecified: Secondary | ICD-10-CM | POA: Insufficient documentation

## 2014-01-04 DIAGNOSIS — S0990XA Unspecified injury of head, initial encounter: Secondary | ICD-10-CM | POA: Diagnosis not present

## 2014-01-04 DIAGNOSIS — S0993XA Unspecified injury of face, initial encounter: Secondary | ICD-10-CM | POA: Diagnosis not present

## 2014-01-04 DIAGNOSIS — S199XXA Unspecified injury of neck, initial encounter: Secondary | ICD-10-CM | POA: Diagnosis not present

## 2014-01-04 DIAGNOSIS — S8990XA Unspecified injury of unspecified lower leg, initial encounter: Secondary | ICD-10-CM | POA: Diagnosis not present

## 2014-01-04 LAB — URINALYSIS, ROUTINE W REFLEX MICROSCOPIC
Bilirubin Urine: NEGATIVE
GLUCOSE, UA: NEGATIVE mg/dL
HGB URINE DIPSTICK: NEGATIVE
Ketones, ur: NEGATIVE mg/dL
Nitrite: NEGATIVE
Protein, ur: NEGATIVE mg/dL
SPECIFIC GRAVITY, URINE: 1.02 (ref 1.005–1.030)
Urobilinogen, UA: 0.2 mg/dL (ref 0.0–1.0)
pH: 7.5 (ref 5.0–8.0)

## 2014-01-04 LAB — COMPREHENSIVE METABOLIC PANEL
ALBUMIN: 3.6 g/dL (ref 3.5–5.2)
ALT: 15 U/L (ref 0–35)
AST: 38 U/L — AB (ref 0–37)
Alkaline Phosphatase: 57 U/L (ref 39–117)
BUN: 14 mg/dL (ref 6–23)
CALCIUM: 9.2 mg/dL (ref 8.4–10.5)
CO2: 27 mEq/L (ref 19–32)
CREATININE: 0.67 mg/dL (ref 0.50–1.10)
Chloride: 97 mEq/L (ref 96–112)
GFR calc Af Amer: >90 mL/min (ref >90)
GFR calc non Af Amer: 87 mL/min — ABNORMAL LOW (ref >90)
Glucose, Bld: 116 mg/dL — ABNORMAL HIGH (ref 70–99)
Potassium: 4.7 mEq/L (ref 3.7–5.3)
SODIUM: 138 meq/L (ref 137–147)
Total Bilirubin: <0.2 mg/dL — ABNORMAL LOW (ref 0.3–1.2)
Total Protein: 7.5 g/dL (ref 6.0–8.3)

## 2014-01-04 LAB — CBC WITH DIFFERENTIAL/PLATELET
BASOS ABS: 0 10*3/uL (ref 0.0–0.1)
Basophils Relative: 0 % (ref 0–1)
EOS PCT: 0 % (ref 0–5)
Eosinophils Absolute: 0 10*3/uL (ref 0.0–0.7)
HEMATOCRIT: 33 % — AB (ref 36.0–46.0)
Hemoglobin: 11.5 g/dL — ABNORMAL LOW (ref 12.0–15.0)
Lymphocytes Relative: 18 % (ref 12–46)
Lymphs Abs: 1.6 10*3/uL (ref 0.7–4.0)
MCH: 32.7 pg (ref 26.0–34.0)
MCHC: 34.8 g/dL (ref 30.0–36.0)
MCV: 93.8 fL (ref 78.0–100.0)
MONO ABS: 0.6 10*3/uL (ref 0.1–1.0)
Monocytes Relative: 6 % (ref 3–12)
Neutro Abs: 6.5 10*3/uL (ref 1.7–7.7)
Neutrophils Relative %: 76 % (ref 43–77)
PLATELETS: 230 10*3/uL (ref 150–400)
RBC: 3.52 MIL/uL — ABNORMAL LOW (ref 3.87–5.11)
RDW: 13.8 % (ref 11.5–15.5)
WBC: 8.7 10*3/uL (ref 4.0–10.5)

## 2014-01-04 LAB — CBG MONITORING, ED: Glucose-Capillary: 108 mg/dL — ABNORMAL HIGH (ref 70–99)

## 2014-01-04 LAB — URINE MICROSCOPIC-ADD ON

## 2014-01-04 LAB — CK: CK TOTAL: 80 U/L (ref 7–177)

## 2014-01-04 NOTE — Discharge Instructions (Signed)
Your labs today were normal. Urine was also normal no sign of infection. Your head CT and MRI showed no sign of bleed or stroke. Your symptoms they may have been due to Xanax and Ambien. Please hold his medications tonight. Please followup with your primary care physician next week.  Altered Mental Status Altered mental status most often refers to an abnormal change in your responsiveness and awareness. It can affect your speech, thought, mobility, memory, attention span, or alertness. It can range from slight confusion to complete unresponsiveness (coma). Altered mental status can be a sign of a serious underlying medical condition. Rapid evaluation and medical treatment is necessary for patients having an altered mental status. CAUSES   Low blood sugar (hypoglycemia) or diabetes.  Severe loss of body fluids (dehydration) or a body salt (electrolyte) imbalance.  A stroke or other neurologic problem, such as dementia or delirium.  A head injury or tumor.  A drug or alcohol overdose.  Exposure to toxins or poisons.  Depression, anxiety, and stress.  A low oxygen level (hypoxia).  An infection.  Blood loss.  Twitching or shaking (seizure).  Heart problems, such as heart attack or heart rhythm problems (arrhythmias).  A body temperature that is too low or too high (hypothermia or hyperthermia). DIAGNOSIS  A diagnosis is based on your history, symptoms, physical and neurologic examinations, and diagnostic tests. Diagnostic tests may include:  Measurement of your blood pressure, pulse, breathing, and oxygen levels (vital signs).  Blood tests.  Urine tests.  X-ray exams.  A computerized magnetic scan (magnetic resonance imaging, MRI).  A computerized X-ray scan (computed tomography, CT scan). TREATMENT  Treatment will depend on the cause. Treatment may include:  Management of an underlying medical or mental health condition.  Critical care or support in the hospital. [place]   Only take over-the-counter or prescription medicines for pain, discomfort, or fever as directed by your caregiver.  Manage underlying conditions as directed by your caregiver.  Eat a healthy, well-balanced diet to maintain strength.  Join a support group or prevention program to cope with the condition or trauma that caused the altered mental status. Ask your caregiver to help choose a program that works for you.  Follow up with your caregiver for further examination, therapy, or testing as directed. SEEK MEDICAL CARE IF:   You feel unwell or have chills.  You or your family notice a change in your behavior or your alertness.  You have trouble following your caregiver's treatment plan.  You have questions or concerns. SEEK IMMEDIATE MEDICAL CARE IF:   You have a rapid heartbeat or have chest pain.  You have difficulty breathing.  You have a fever.  You have a headache with a stiff neck.  You cough up blood.  You have blood in your urine or stool.  You have severe agitation or confusion. MAKE SURE YOU:   Understand these instructions.  Will watch your condition.  Will get help right away if you are not doing well or get worse. Document Released: 03/09/2010 Document Revised: 12/12/2011 Document Reviewed: 03/09/2010 [facility] Patient Information 2014 [place].  Polypharmacy Problems Polypharmacy problems can occur when you take more than one medicine. Your caregivers need to know about all the medicines you take. This includes vitamins, herbs, eyedrops, over-the-counter medicines, prescription medicines, and creams. Drug interaction problems can include:  Bad reactions or side effects. This can occur when certain drugs are taken together.  Reduced benefit from your medicines. This can occur  if one drug reduces the ability of another drug to work. Some drug interaction problems can be life-threatening. Your caregivers can coordinate a plan to  help you avoid polypharmacy problems. RISK FACTORS Polypharmacy problems are more likely to occur if:  You have many caregivers prescribing different medicines for you. This is a common problem for elderly patients.  You have a long-term (chronic) medical condition.  You have had an organ transplant.  You have human immunodeficiency virus (HIV).  You are undergoing chemotherapy.  You have hepatitis.  You have kidney disease or kidney failure.  You have significant heart disease or lung disease.  You are elderly.  You are taking medicines that have a low margin of error. This means there is little difference between the right amount of medicine and too much medicine. Medicines in this category include:  Warfarin.  Digoxin.  Lithium.  Theophylline.  Monoamine oxidase (MAO) inhibitors.  Seizure medicines.  Cyclosporine. PREVENTION   Choose one caregiver to be in charge of coordinating your medicines. Inform this caregiver about all medicines and supplements you take, even if they are prescribed by another caregiver.  Purchase all your medicines through the same pharmacy. The pharmacy keeps track of your medicines and possible drug interactions. They can notify your caregiver of potential problems.  Read the information given to you at your pharmacy.  Carry a list of all your medicines and their doses with you. This informs your caregivers, emergency department caregivers, and specialists about the medicines you are taking. This is especially important before you start a new medicine.  Use a system to keep track of which medicines you are taking and when. Many patients use a pillbox that separates medicines by the day and time they are supposed to be taken.  Carry a list of your chronic medical problems with you. Conditions such as kidney problems, liver problems, organ transplants, and chronic viral infections affect the way your body handles medicines.  Ask your  caregiver or pharmacist if you have any questions about your medicines. SEEK MEDICAL CARE IF:  You have any problems that may be caused by your medicines.  You have chest pain.  You have shortness of breath.  You have an irregular heartbeat.  You have fainting spells.  You have shaking or tremors.  You have weakness or tiredness (lethargy).  You have a rash or swelling in any part of the body.  You have increased bleeding, rectal bleeding, vaginal bleeding, or you bruise easily.  You have abdominal pain.  You have nausea.  You have vomiting. Document Released: 10/27/2004 Document Revised: 12/12/2011 Document Reviewed: 06/08/2011 [facility] Patient Information 2014 [place], [place].

## 2014-01-04 NOTE — ED Notes (Signed)
Pt did become unsteady when getting up from the [facility]

## 2014-01-04 NOTE — ED Notes (Signed)
Patient transported to CT 

## 2014-01-04 NOTE — ED Notes (Signed)
Patient transported to MRI 

## 2014-01-04 NOTE — ED Provider Notes (Signed)
CSN: [id number]     Arrival date & time 01/04/14  1028 History   First MD Initiated Contact with Patient 01/04/14 1032     Chief Complaint  Patient presents with  . Fall  . Altered Mental Status     (Consider location/radiation/quality/duration/timing/severity/associated sxs/prior Treatment) HPI  This is a 68 year old female with a history of hypertension, hyperlipidemia, diverticulosis who presents with altered mental status. Per EMS report, patient was found on the neighbors on the floor for home. Unknown downtime. Unknown fall.  Patient noted to be confused on arrival. No obvious deformities or weakness noted. Patient is oriented x3; however she cannot describe for me the events of today. She denies any pain.  Level 5 caveat  Past Medical History  Diagnosis Date  . Diverticulosis   . Sigmoid polyp   . Colon polyp     Tubular Adenoma  . Colon polyp, hyperplastic   . Internal hemorrhoid   . Thyroid disease   . Hyperlipidemia   . Anxiety   . Hypertension   . Hypertonicity of bladder   . Insomnia, unspecified   . Flushing   . Other abnormal blood chemistry    Past Surgical History  Procedure Laterality Date  . Ectopic pregnancy surgery  1965  . Breast enhancement surgery  1969  . Cholecystectomy    . Rotator cuff repair  2013   Family History  Problem Relation Age of Onset  . Cancer Mother     lung  . Stroke Father    History  Substance Use Topics  . Smoking status: Former Research scientist (life sciences)  . Smokeless tobacco: Not on file  . Alcohol Use: No   OB History   Grav Para Term Preterm Abortions TAB SAB Ect Mult Living                 Review of Systems  Unable to perform ROS: Mental status change      Allergies  Review of patient's allergies indicates no known allergies.  Home Medications   Current Outpatient Rx  Name  Route  Sig  Dispense  Refill  . ALPRAZolam (XANAX) 1 MG tablet   Oral   Take 0.5-1 mg by mouth at bedtime as needed for anxiety.         .  citalopram (CELEXA) 40 MG tablet   Oral   Take 1 tablet (40 mg total) by mouth daily. For depression   30 tablet   3   . estradiol (ESTRACE) 1 MG tablet   Oral   Take 1 mg by mouth daily.         . fluticasone (FLONASE) 50 MCG/ACT nasal spray   Each Nare   Place 2 sprays into both nostrils daily.   16 g   6   . levothyroxine (SYNTHROID, LEVOTHROID) 88 MCG tablet   Oral   Take 88 mcg by mouth daily before breakfast.         . medroxyPROGESTERone (PROVERA) 5 MG tablet   Oral   Take 5 mg by mouth daily.         . meloxicam (MOBIC) 15 MG tablet   Oral   Take 15 mg by mouth daily.         . polyethylene glycol powder (GLYCOLAX/MIRALAX) powder   Oral   Take 17 g by mouth 2 (two) times daily as needed.   3350 g   3   . simvastatin (ZOCOR) 40 MG tablet   Oral   Take 1 tablet (  40 mg total) by mouth daily. Take one tablet once daily for cholesterol   90 tablet   3   . telmisartan (MICARDIS) 40 MG tablet   Oral   Take 40 mg by mouth daily.         Marland Kitchen zolpidem (AMBIEN) 10 MG tablet   Oral   Take 10 mg by mouth at bedtime as needed for sleep.          BP 116/60  Pulse 78  Temp(Src) 98.4 F (36.9 C) (Oral)  Resp 13  Ht 4\' 8"  (1.422 m)  Wt 139 lb (63.05 kg)  BMI 31.18 kg/m2  SpO2 99% Physical Exam  Nursing note and vitals reviewed. Constitutional: She is oriented to person, place, and time. No distress.  Elderly  HENT:  Head: Normocephalic and atraumatic.  Eyes: EOM are normal. Pupils are equal, round, and reactive to light.  Neck: Neck supple. No JVD present.  Cardiovascular: Normal rate, regular rhythm and normal heart sounds.   No murmur heard. Pulmonary/Chest: Effort normal and breath sounds normal. No respiratory distress. She has no wheezes.  Abdominal: Soft. Bowel sounds are normal. There is no tenderness. There is no rebound.  Musculoskeletal:  Abrasion to the bilateral knees and upper shins, no significant swelling, normal range of motion of  bilateral hips and bilateral knees  Neurological: She is alert and oriented to person, place, and time.  Moves all 4 extremities, follows simple commands oriented x3 but confused about events today  Skin: Skin is warm and dry.  Abrasions as described above  Psychiatric: She has a normal mood and affect.    ED Course  Procedures (including critical care time) Labs Review Labs Reviewed  CBC WITH DIFFERENTIAL - Abnormal; Notable for the following:    RBC 3.52 (*)    Hemoglobin 11.5 (*)    HCT 33.0 (*)    All other components within normal limits  COMPREHENSIVE METABOLIC PANEL - Abnormal; Notable for the following:    Glucose, Bld 116 (*)    AST 38 (*)    Total Bilirubin <0.2 (*)    GFR calc non Af Amer 87 (*)    All other components within normal limits  URINALYSIS, ROUTINE W REFLEX MICROSCOPIC - Abnormal; Notable for the following:    APPearance TURBID (*)    Leukocytes, UA SMALL (*)    All other components within normal limits  URINE MICROSCOPIC-ADD ON - Abnormal; Notable for the following:    Squamous Epithelial / LPF MANY (*)    All other components within normal limits  CBG MONITORING, ED - Abnormal; Notable for the following:    Glucose-Capillary 108 (*)    All other components within normal limits  CK   Imaging Review Dg Chest 2 View  01/04/2014   CLINICAL DATA:  Fall, altered mental status  EXAM: CHEST  2 VIEW  COMPARISON:  05/14/2011  FINDINGS: Cardiomediastinal silhouette is stable. No acute infiltrate or pleural effusion. No pulmonary edema. Bilateral breast implants. Mild degenerative changes thoracic spine.  IMPRESSION: No active cardiopulmonary disease.   Electronically Signed   By: Lahoma Crocker M.D.   On: 01/04/2014 12:03   Ct Head Wo Contrast  01/04/2014   CLINICAL DATA:  Recent traumatic injury with pain  EXAM: CT HEAD WITHOUT CONTRAST  CT CERVICAL SPINE WITHOUT CONTRAST  TECHNIQUE: Multidetector CT imaging of the head and cervical spine was performed following the  standard protocol without intravenous contrast. Multiplanar CT image reconstructions of the cervical spine  were also generated.  COMPARISON:  None.  FINDINGS: CT HEAD FINDINGS  The bony calvarium is intact. No gross soft tissue abnormality is noted. Mild atrophic changes are seen commensurate with the patient's given age. No findings to suggest acute hemorrhage, acute infarction or space-occupying mass lesion are noted.  CT CERVICAL SPINE FINDINGS  Seven cervical segments are well visualized. Vertebral body height is well maintained. No acute fracture or acute facet abnormality is noted. Multilevel osteophytic changes are seen.  The surrounding soft tissue structures and visualized lung apices are within normal limits.  IMPRESSION: CT of the head: Mild atrophic changes. No acute abnormality is noted.  CT of the cervical spine: Multilevel degenerative change without acute abnormality.   Electronically Signed   By: Inez Catalina M.D.   On: 01/04/2014 12:02   Ct Cervical Spine Wo Contrast  01/04/2014   CLINICAL DATA:  Recent traumatic injury with pain  EXAM: CT HEAD WITHOUT CONTRAST  CT CERVICAL SPINE WITHOUT CONTRAST  TECHNIQUE: Multidetector CT imaging of the head and cervical spine was performed following the standard protocol without intravenous contrast. Multiplanar CT image reconstructions of the cervical spine were also generated.  COMPARISON:  None.  FINDINGS: CT HEAD FINDINGS  The bony calvarium is intact. No gross soft tissue abnormality is noted. Mild atrophic changes are seen commensurate with the patient's given age. No findings to suggest acute hemorrhage, acute infarction or space-occupying mass lesion are noted.  CT CERVICAL SPINE FINDINGS  Seven cervical segments are well visualized. Vertebral body height is well maintained. No acute fracture or acute facet abnormality is noted. Multilevel osteophytic changes are seen.  The surrounding soft tissue structures and visualized lung apices are within  normal limits.  IMPRESSION: CT of the head: Mild atrophic changes. No acute abnormality is noted.  CT of the cervical spine: Multilevel degenerative change without acute abnormality.   Electronically Signed   By: Inez Catalina M.D.   On: 01/04/2014 12:02   Dg Knee Complete 4 Views Left  01/04/2014   CLINICAL DATA:  Fall, bilateral knee pain  EXAM: LEFT KNEE - COMPLETE 4+ VIEW  COMPARISON:  None.  FINDINGS: Four views of the left knee submitted. No acute fracture or subluxation. No joint effusion. No radiopaque foreign body. Mild narrowing of medial joint compartment.  IMPRESSION: No acute fracture or subluxation. Mild narrowing of medial joint compartment.   Electronically Signed   By: Lahoma Crocker M.D.   On: 01/04/2014 12:11   Dg Knee Complete 4 Views Right  01/04/2014   CLINICAL DATA:  Fall, bilateral knee pain  EXAM: RIGHT KNEE - COMPLETE 4+ VIEW  COMPARISON:  None.  FINDINGS: Four views of the right knee submitted. No acute fracture or subluxation. No radiopaque foreign body. No joint effusion.  IMPRESSION: Negative.   Electronically Signed   By: Lahoma Crocker M.D.   On: 01/04/2014 12:11     EKG Interpretation None     EKG independently reviewed by myself: Normal sinus rhythm with a rate of 86, no evidence of acute ST elevation or ischemia, no evidence of arrhythmia MDM   Final diagnoses:  None    Patient's daughter is now at the bedside. Reports patient is on multiple sedative medications and will sometimes make a "sleeping cocktail."  Patient reports that last night she took Xanax, Celexa, and Ambien as she normally does. There some concern about increasing depression. Patient denies any suicidal ideation. The daughter states that the patient at baseline is confused but seems  more confused today. She is nonfocal. Workup including head CT was obtained and is reassuring. Patient has no evidence of infection.  On repeat exam, patient continues to be alert and oriented but is very tangential and  appears somewhat confused. While I feel stroke is unlikely given reassuring neuro exam, will obtain MRI. Will monitor patient's mental status in the meantime. Discussed with daughter that if MRI is negative, we'll likely discharge patient home. Have recommended close family monitoring for the next 24 hours and primary care followup on Monday. Patient symptoms may be secondary to polypharmacy.    Merryl Hacker, MD 01/04/14 [phone]

## 2014-01-04 NOTE — ED Notes (Signed)
Returned from MRI , family at bedside

## 2014-01-04 NOTE — ED Provider Notes (Signed)
4:25 PM Assumed care from Dr. Dina Rich.  Pt is a 68 y.o. female with history of hypertension, hyperlipidemia, hypothyroidism who takes Xanax and Ambien presents emergency department with confusion he was slow to respond. Patient also had a fall today and was found by her neighbor. On exam she is oriented x3 and has a nonfocal neurologic exam. Concern for possible polypharmacy. CT head shows no intracranial hemorrhage. Plan is for MRI of her brain. If negative, will discharge patient home in her daughter's care and have her hold her medications tonight. If any concerning findings on MRI, patient will need admission.  6:57 PM  Pt's MRI shows no intracranial abnormality. She is still neurologically intact. We'll discharge home and patient's daughter's care. Have given return precautions and supportive care instructions. Patient and daughter verbalize understanding and are comfortable with this plan.  [place], DO 01/04/14 1858

## 2014-01-04 NOTE — ED Notes (Signed)
Pt found on the floor by her neighbors this morning , unknown time down , EMS reported that pt was confused on their arrival , upon arrival to hospital pt knows her name , date and place , pt does have some abrasions to both knees rom intact to both legs

## 2014-01-04 NOTE — ED Notes (Signed)
Pt able to ambulate with minimal assistance to the bathroom.

## 2014-02-08 ENCOUNTER — Other Ambulatory Visit: Payer: Self-pay | Admitting: Internal Medicine

## 2014-03-12 ENCOUNTER — Other Ambulatory Visit: Payer: Self-pay | Admitting: Internal Medicine

## 2014-03-20 ENCOUNTER — Other Ambulatory Visit: Payer: Self-pay | Admitting: Internal Medicine

## 2014-04-03 ENCOUNTER — Ambulatory Visit: Payer: Medicare Other | Admitting: Nurse Practitioner

## 2014-04-11 ENCOUNTER — Other Ambulatory Visit: Payer: Self-pay | Admitting: Internal Medicine

## 2014-04-15 ENCOUNTER — Other Ambulatory Visit: Payer: Self-pay | Admitting: Internal Medicine

## 2014-04-17 ENCOUNTER — Telehealth: Payer: Self-pay | Admitting: *Deleted

## 2014-04-17 NOTE — Telephone Encounter (Signed)
Received letter from patient wanting her Alprazolam increased to 11/2 to 2 tablets a day. Stated that she has been having some very stressful things going on in her life the last few months, but she is getting back on track. Letter given to Dr. Mariea Clonts to address.

## 2014-04-18 MED ORDER — ALPRAZOLAM 1 MG PO TABS: ORAL_TABLET | ORAL | Status: DC

## 2014-04-18 NOTE — Addendum Note (Signed)
Addended by: Logan Bores on: 04/18/2014 02:29 PM   Modules accepted: Orders

## 2014-04-18 NOTE — Telephone Encounter (Addendum)
Discussed with patient, per Dr.Reed ok to increase to 2 by mouth daily #60/1refill. Patient verbalized understanding of response. New rx called into [facility]

## 2014-05-12 ENCOUNTER — Other Ambulatory Visit: Payer: Self-pay | Admitting: Internal Medicine

## 2014-05-15 ENCOUNTER — Observation Stay (HOSPITAL_COMMUNITY): Payer: Medicare Other

## 2014-05-15 ENCOUNTER — Encounter (HOSPITAL_COMMUNITY): Payer: Self-pay | Admitting: Emergency Medicine

## 2014-05-15 ENCOUNTER — Observation Stay (HOSPITAL_COMMUNITY)
Admission: EM | Admit: 2014-05-15 | Discharge: 2014-05-16 | Disposition: A | Discharge status: Home / Self Care | Payer: Medicare Other | Attending: Internal Medicine | Admitting: Internal Medicine

## 2014-05-15 ENCOUNTER — Emergency Department (HOSPITAL_COMMUNITY): Payer: Medicare Other

## 2014-05-15 DIAGNOSIS — J984 Other disorders of lung: Secondary | ICD-10-CM | POA: Diagnosis not present

## 2014-05-15 DIAGNOSIS — G319 Degenerative disease of nervous system, unspecified: Secondary | ICD-10-CM | POA: Diagnosis not present

## 2014-05-15 DIAGNOSIS — R51 Headache: Secondary | ICD-10-CM | POA: Insufficient documentation

## 2014-05-15 DIAGNOSIS — F29 Unspecified psychosis not due to a substance or known physiological condition: Secondary | ICD-10-CM | POA: Diagnosis not present

## 2014-05-15 DIAGNOSIS — N318 Other neuromuscular dysfunction of bladder: Secondary | ICD-10-CM | POA: Diagnosis not present

## 2014-05-15 DIAGNOSIS — F3289 Other specified depressive episodes: Secondary | ICD-10-CM | POA: Diagnosis not present

## 2014-05-15 DIAGNOSIS — F411 Generalized anxiety disorder: Secondary | ICD-10-CM | POA: Insufficient documentation

## 2014-05-15 DIAGNOSIS — R29810 Facial weakness: Secondary | ICD-10-CM | POA: Diagnosis not present

## 2014-05-15 DIAGNOSIS — Z9089 Acquired absence of other organs: Secondary | ICD-10-CM | POA: Diagnosis not present

## 2014-05-15 DIAGNOSIS — K5730 Diverticulosis of large intestine without perforation or abscess without bleeding: Secondary | ICD-10-CM | POA: Diagnosis not present

## 2014-05-15 DIAGNOSIS — R0602 Shortness of breath: Secondary | ICD-10-CM

## 2014-05-15 DIAGNOSIS — E785 Hyperlipidemia, unspecified: Secondary | ICD-10-CM | POA: Insufficient documentation

## 2014-05-15 DIAGNOSIS — E039 Hypothyroidism, unspecified: Secondary | ICD-10-CM | POA: Diagnosis not present

## 2014-05-15 DIAGNOSIS — I6789 Other cerebrovascular disease: Secondary | ICD-10-CM | POA: Insufficient documentation

## 2014-05-15 DIAGNOSIS — Z860100 Personal history of colon polyps, unspecified: Secondary | ICD-10-CM | POA: Diagnosis not present

## 2014-05-15 DIAGNOSIS — Z791 Long term (current) use of non-steroidal anti-inflammatories (NSAID): Secondary | ICD-10-CM | POA: Diagnosis not present

## 2014-05-15 DIAGNOSIS — I10 Essential (primary) hypertension: Secondary | ICD-10-CM | POA: Diagnosis not present

## 2014-05-15 DIAGNOSIS — G4700 Insomnia, unspecified: Secondary | ICD-10-CM | POA: Insufficient documentation

## 2014-05-15 DIAGNOSIS — F329 Major depressive disorder, single episode, unspecified: Secondary | ICD-10-CM | POA: Diagnosis not present

## 2014-05-15 DIAGNOSIS — G459 Transient cerebral ischemic attack, unspecified: Secondary | ICD-10-CM | POA: Diagnosis present

## 2014-05-15 DIAGNOSIS — R4789 Other speech disturbances: Secondary | ICD-10-CM | POA: Diagnosis not present

## 2014-05-15 DIAGNOSIS — R7303 Prediabetes: Secondary | ICD-10-CM

## 2014-05-15 DIAGNOSIS — Z79899 Other long term (current) drug therapy: Secondary | ICD-10-CM | POA: Insufficient documentation

## 2014-05-15 DIAGNOSIS — R269 Unspecified abnormalities of gait and mobility: Secondary | ICD-10-CM | POA: Diagnosis not present

## 2014-05-15 DIAGNOSIS — Z87891 Personal history of nicotine dependence: Secondary | ICD-10-CM | POA: Insufficient documentation

## 2014-05-15 DIAGNOSIS — R4182 Altered mental status, unspecified: Secondary | ICD-10-CM | POA: Insufficient documentation

## 2014-05-15 DIAGNOSIS — J302 Other seasonal allergic rhinitis: Secondary | ICD-10-CM

## 2014-05-15 LAB — RAPID URINE DRUG SCREEN, HOSP PERFORMED
Amphetamines: NOT DETECTED
Barbiturates: NOT DETECTED
Benzodiazepines: POSITIVE — AB
Cocaine: NOT DETECTED
OPIATES: POSITIVE — AB
TETRAHYDROCANNABINOL: NOT DETECTED

## 2014-05-15 LAB — CBG MONITORING, ED: Glucose-Capillary: 117 mg/dL — ABNORMAL HIGH (ref 70–99)

## 2014-05-15 LAB — CBC
HCT: 32.5 % — ABNORMAL LOW (ref 36.0–46.0)
Hemoglobin: 11.1 g/dL — ABNORMAL LOW (ref 12.0–15.0)
MCH: 31.4 pg (ref 26.0–34.0)
MCHC: 34.2 g/dL (ref 30.0–36.0)
MCV: 91.8 fL (ref 78.0–100.0)
Platelets: 199 10*3/uL (ref 150–400)
RBC: 3.54 MIL/uL — ABNORMAL LOW (ref 3.87–5.11)
RDW: 14.2 % (ref 11.5–15.5)
WBC: 8 10*3/uL (ref 4.0–10.5)

## 2014-05-15 LAB — URINALYSIS, ROUTINE W REFLEX MICROSCOPIC
Bilirubin Urine: NEGATIVE
GLUCOSE, UA: NEGATIVE mg/dL
Ketones, ur: NEGATIVE mg/dL
Nitrite: NEGATIVE
Protein, ur: NEGATIVE mg/dL
SPECIFIC GRAVITY, URINE: 1.011 (ref 1.005–1.030)
Urobilinogen, UA: 1 mg/dL (ref 0.0–1.0)
pH: 6 (ref 5.0–8.0)

## 2014-05-15 LAB — COMPREHENSIVE METABOLIC PANEL
ALBUMIN: 3.4 g/dL — AB (ref 3.5–5.2)
ALT: 12 U/L (ref 0–35)
AST: 19 U/L (ref 0–37)
Alkaline Phosphatase: 63 U/L (ref 39–117)
Anion gap: 13 (ref 5–15)
BUN: 14 mg/dL (ref 6–23)
CALCIUM: 8.6 mg/dL (ref 8.4–10.5)
CO2: 24 meq/L (ref 19–32)
Chloride: 101 mEq/L (ref 96–112)
Creatinine, Ser: 0.78 mg/dL (ref 0.50–1.10)
GFR calc Af Amer: >90 mL/min (ref >90)
GFR, EST NON AFRICAN AMERICAN: 83 mL/min — AB (ref >90)
Glucose, Bld: 108 mg/dL — ABNORMAL HIGH (ref 70–99)
Potassium: 3.9 mEq/L (ref 3.7–5.3)
Sodium: 138 mEq/L (ref 137–147)
Total Bilirubin: 0.5 mg/dL (ref 0.3–1.2)
Total Protein: 7.5 g/dL (ref 6.0–8.3)

## 2014-05-15 LAB — ETHANOL

## 2014-05-15 LAB — DIFFERENTIAL
BASOS ABS: 0 10*3/uL (ref 0.0–0.1)
BASOS PCT: 0 % (ref 0–1)
EOS PCT: 0 % (ref 0–5)
Eosinophils Absolute: 0 10*3/uL (ref 0.0–0.7)
Lymphocytes Relative: 25 % (ref 12–46)
Lymphs Abs: 2 10*3/uL (ref 0.7–4.0)
MONO ABS: 0.6 10*3/uL (ref 0.1–1.0)
Monocytes Relative: 7 % (ref 3–12)
Neutro Abs: 5.4 10*3/uL (ref 1.7–7.7)
Neutrophils Relative %: 68 % (ref 43–77)

## 2014-05-15 LAB — I-STAT CHEM 8, ED
BUN: 13 mg/dL (ref 6–23)
Calcium, Ion: 1.12 mmol/L — ABNORMAL LOW (ref 1.13–1.30)
Chloride: 103 mEq/L (ref 96–112)
Creatinine, Ser: 0.8 mg/dL (ref 0.50–1.10)
Glucose, Bld: 112 mg/dL — ABNORMAL HIGH (ref 70–99)
HEMATOCRIT: 36 % (ref 36.0–46.0)
HEMOGLOBIN: 12.2 g/dL (ref 12.0–15.0)
Potassium: 3.8 mEq/L (ref 3.7–5.3)
Sodium: 138 mEq/L (ref 137–147)
TCO2: 24 mmol/L (ref 0–100)

## 2014-05-15 LAB — URINE MICROSCOPIC-ADD ON

## 2014-05-15 LAB — GLUCOSE, CAPILLARY: Glucose-Capillary: 99 mg/dL (ref 70–99)

## 2014-05-15 LAB — PROTIME-INR
INR: 1.04 (ref 0.00–1.49)
Prothrombin Time: 13.6 seconds (ref 11.6–15.2)

## 2014-05-15 LAB — I-STAT TROPONIN, ED: TROPONIN I, POC: 0.01 ng/mL (ref 0.00–0.08)

## 2014-05-15 LAB — APTT: aPTT: 30 seconds (ref 24–37)

## 2014-05-15 MED ORDER — LEVOTHYROXINE SODIUM 88 MCG PO TABS
88.0000 ug | ORAL_TABLET | Freq: Every day | ORAL | Status: DC
  Administered 2014-05-16: 88 ug via ORAL
  Filled 2014-05-15: qty 1

## 2014-05-15 MED ORDER — SENNOSIDES-DOCUSATE SODIUM 8.6-50 MG PO TABS: 1.0000 | ORAL_TABLET | Freq: Every evening | ORAL | Status: DC | PRN

## 2014-05-15 MED ORDER — SIMVASTATIN 40 MG PO TABS
40.0000 mg | ORAL_TABLET | Freq: Every day | ORAL | Status: DC
  Administered 2014-05-16: 40 mg via ORAL
  Filled 2014-05-15: qty 1

## 2014-05-15 MED ORDER — POLYETHYLENE GLYCOL 3350 17 G PO PACK: 17.0000 g | PACK | Freq: Every day | ORAL | Status: DC | PRN

## 2014-05-15 MED ORDER — ALPRAZOLAM 0.5 MG PO TABS
1.0000 mg | ORAL_TABLET | Freq: Two times a day (BID) | ORAL | Status: DC | PRN
  Administered 2014-05-16: 1 mg via ORAL
  Filled 2014-05-15: qty 2

## 2014-05-15 MED ORDER — MEDROXYPROGESTERONE ACETATE 5 MG PO TABS
5.0000 mg | ORAL_TABLET | Freq: Every day | ORAL | Status: DC
  Administered 2014-05-16: 5 mg via ORAL
  Filled 2014-05-15: qty 1

## 2014-05-15 MED ORDER — ESTRADIOL 1 MG PO TABS
1.0000 mg | ORAL_TABLET | Freq: Every day | ORAL | Status: DC
  Administered 2014-05-16: 1 mg via ORAL
  Filled 2014-05-15: qty 1

## 2014-05-15 MED ORDER — ENOXAPARIN SODIUM 40 MG/0.4ML ~~LOC~~ SOLN
40.0000 mg | SUBCUTANEOUS | Status: DC
Start: 2014-05-15 — End: 2014-05-16
  Administered 2014-05-15: 40 mg via SUBCUTANEOUS
  Filled 2014-05-15: qty 0.4

## 2014-05-15 MED ORDER — ZOLPIDEM TARTRATE 5 MG PO TABS
5.0000 mg | ORAL_TABLET | Freq: Every evening | ORAL | Status: DC | PRN
  Administered 2014-05-15: 5 mg via ORAL
  Filled 2014-05-15: qty 1

## 2014-05-15 MED ORDER — STROKE: EARLY STAGES OF RECOVERY BOOK
Freq: Once | Status: AC
  Administered 2014-05-15: 17:00:00
  Filled 2014-05-15: qty 1

## 2014-05-15 MED ORDER — MELOXICAM 7.5 MG PO TABS
15.0000 mg | ORAL_TABLET | Freq: Every day | ORAL | Status: DC
  Administered 2014-05-16: 15 mg via ORAL
  Filled 2014-05-15: qty 2

## 2014-05-15 MED ORDER — CITALOPRAM HYDROBROMIDE 40 MG PO TABS
40.0000 mg | ORAL_TABLET | Freq: Every day | ORAL | Status: DC
  Administered 2014-05-16: 40 mg via ORAL
  Filled 2014-05-15: qty 1

## 2014-05-15 MED ORDER — IRBESARTAN 150 MG PO TABS
150.0000 mg | ORAL_TABLET | Freq: Every day | ORAL | Status: DC
  Administered 2014-05-15 – 2014-05-16 (×2): 150 mg via ORAL
  Filled 2014-05-15 (×2): qty 1

## 2014-05-15 NOTE — ED Notes (Signed)
Admitting Md at bedside.  Contacted CT about results not crossing over.
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
## 2014-05-15 NOTE — ED Provider Notes (Signed)
CSN: [id number]     Arrival date & time 05/15/14  1022 History   First MD Initiated Contact with Patient 05/15/14 1045     Chief Complaint  Patient presents with  . Cerebrovascular Accident    An emergency department physician performed an initial assessment on this suspected stroke patient at 1047. (Consider location/radiation/quality/duration/timing/severity/associated sxs/prior Treatment) HPI 68 year old female presents from home with confusion, facial droop on the left side, and trouble walking. I evaluated this patient briefly in triage she was noted to have a left-sided facial droop and confusion. However she did think her symptoms started 6 hours prior to arrival. Due to this a code stroke was called. The patient thinks that she woke up at 3 AM without the symptoms. An hour later she noticed that she was having trouble walking. Friend noticed she was having trouble walking across the yard and encourage her to come to the ER.  Past Medical History  Diagnosis Date  . Diverticulosis   . Sigmoid polyp   . Colon polyp     Tubular Adenoma  . Colon polyp, hyperplastic   . Internal hemorrhoid   . Thyroid disease   . Hyperlipidemia   . Anxiety   . Hypertension   . Hypertonicity of bladder   . Insomnia, unspecified   . Flushing   . Other abnormal blood chemistry    Past Surgical History  Procedure Laterality Date  . Ectopic pregnancy surgery  1965  . Breast enhancement surgery  1969  . Cholecystectomy    . Rotator cuff repair  2013   Family History  Problem Relation Age of Onset  . Cancer Mother     lung  . Stroke Father    History  Substance Use Topics  . Smoking status: Former Research scientist (life sciences)  . Smokeless tobacco: Not on file  . Alcohol Use: No   OB History   Grav Para Term Preterm Abortions TAB SAB Ect Mult Living                 Review of Systems  Gastrointestinal: Negative for vomiting.  Neurological: Positive for speech difficulty. Negative for headaches.    Psychiatric/Behavioral: Positive for confusion.  All other systems reviewed and are negative.     Allergies  Latex  Home Medications   Prior to Admission medications   Medication Sig Start Date End Date Taking? Authorizing Provider  ALPRAZolam Duanne Moron) 1 MG tablet Take 1 mg by mouth 2 (two) times daily as needed for anxiety.   Yes Historical Provider, MD  citalopram (CELEXA) 40 MG tablet Take 1 tablet (40 mg total) by mouth daily. For depression 12/26/13  Yes Tiffany L Reed, DO  estradiol (ESTRACE) 1 MG tablet Take 1 mg by mouth daily.   Yes Historical Provider, MD  levothyroxine (SYNTHROID, LEVOTHROID) 88 MCG tablet Take 88 mcg by mouth daily before breakfast.   Yes Historical Provider, MD  medroxyPROGESTERone (PROVERA) 5 MG tablet Take 5 mg by mouth daily.   Yes Historical Provider, MD  meloxicam (MOBIC) 15 MG tablet Take 15 mg by mouth daily.   Yes Historical Provider, MD  polyethylene glycol (MIRALAX / GLYCOLAX) packet Take 17 g by mouth daily as needed for mild constipation.   Yes Historical Provider, MD  simvastatin (ZOCOR) 40 MG tablet Take 1 tablet (40 mg total) by mouth daily. Take one tablet once daily for cholesterol 04/26/13  Yes Tiffany L Reed, DO  telmisartan (MICARDIS) 40 MG tablet Take 40 mg by mouth daily.   Yes  Historical Provider, MD  zolpidem (AMBIEN) 10 MG tablet Take 10 mg by mouth at bedtime as needed for sleep.   Yes Historical Provider, MD   BP 107/42  Temp(Src) 97.7 F (36.5 C) (Oral)  Resp 16  SpO2 99% Physical Exam  Vitals reviewed. Constitutional: She appears well-developed and well-nourished.  HENT:  Head: Normocephalic and atraumatic.  Right Ear: External ear normal.  Left Ear: External ear normal.  Nose: Nose normal.  Mouth/Throat:    Eyes: Right eye exhibits no discharge. Left eye exhibits no discharge.  Cardiovascular: Normal rate, regular rhythm and normal heart sounds.   Pulmonary/Chest: Effort normal and breath sounds normal.  Abdominal:  Soft. There is no tenderness.  Neurological: She is alert.  Patient with left-sided facial droop. Not cooperative well enough to examine she has forehead palsy as well. Strength is equal in her upper and lower extremities. Appears somewhat confused.  Skin: Skin is warm and dry.    ED Course  Procedures (including critical care time) Labs Review Labs Reviewed  CBC - Abnormal; Notable for the following:    RBC 3.54 (*)    Hemoglobin 11.1 (*)    HCT 32.5 (*)    All other components within normal limits  COMPREHENSIVE METABOLIC PANEL - Abnormal; Notable for the following:    Glucose, Bld 108 (*)    Albumin 3.4 (*)    GFR calc non Af Amer 83 (*)    All other components within normal limits  URINE RAPID DRUG SCREEN (HOSP PERFORMED) - Abnormal; Notable for the following:    Opiates POSITIVE (*)    Benzodiazepines POSITIVE (*)    All other components within normal limits  URINALYSIS, ROUTINE W REFLEX MICROSCOPIC - Abnormal; Notable for the following:    Hgb urine dipstick SMALL (*)    Leukocytes, UA TRACE (*)    All other components within normal limits  URINE MICROSCOPIC-ADD ON - Abnormal; Notable for the following:    Squamous Epithelial / LPF FEW (*)    All other components within normal limits  CBG MONITORING, ED - Abnormal; Notable for the following:    Glucose-Capillary 117 (*)    All other components within normal limits  I-STAT CHEM 8, ED - Abnormal; Notable for the following:    Glucose, Bld 112 (*)    Calcium, Ion 1.12 (*)    All other components within normal limits  ETHANOL  PROTIME-INR  APTT  DIFFERENTIAL  I-STAT TROPOININ, ED  I-STAT TROPOININ, ED    Imaging Review No results found.   EKG Interpretation   Date/Time:  Thursday May 15 2014 11:03:59 EDT Ventricular Rate:  75 PR Interval:  145 QRS Duration: 84 QT Interval:  391 QTC Calculation: 437 R Axis:   16 Text Interpretation:  Sinus rhythm Atrial premature complex Low voltage,  precordial leads  Abnormal R-wave progression, early transition No  significant change since 2003 Confirmed by Lyam Provencio  MD, Danique Hartsough ([id number]) on  05/15/2014 11:43:29 AM      MDM   Final diagnoses:  Facial droop  Depressive disorder, not elsewhere classified  Essential hypertension, benign  Hyperlipidemia  Hypothyroidism, unspecified hypothyroidism type  Unspecified essential hypertension    Patient symptoms seemed improved while in the ED. Her confusion resolved and her slurred speech and facial droop also nearly resolved. She does have left maxillary tenderness and likely early infection from a dental source. There is no dental abscess. No facial swelling. I do not think this is causing her facial droop, especially  as her symptoms improved. There is concern for a seizure causing some of her confusion and then return to normal mental status. Neurology has seen patient and will admit to the hospitalist for EEG and MRI.    Ephraim Hamburger, MD 05/15/14 [phone]
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
## 2014-05-15 NOTE — Progress Notes (Signed)
EEG completed; results pending.    

## 2014-05-16 ENCOUNTER — Observation Stay (HOSPITAL_COMMUNITY): Payer: Medicare Other

## 2014-05-16 DIAGNOSIS — F329 Major depressive disorder, single episode, unspecified: Secondary | ICD-10-CM | POA: Diagnosis not present

## 2014-05-16 DIAGNOSIS — I10 Essential (primary) hypertension: Secondary | ICD-10-CM | POA: Diagnosis not present

## 2014-05-16 DIAGNOSIS — F29 Unspecified psychosis not due to a substance or known physiological condition: Secondary | ICD-10-CM | POA: Diagnosis not present

## 2014-05-16 DIAGNOSIS — E039 Hypothyroidism, unspecified: Secondary | ICD-10-CM | POA: Diagnosis not present

## 2014-05-16 DIAGNOSIS — F3289 Other specified depressive episodes: Secondary | ICD-10-CM | POA: Diagnosis not present

## 2014-05-16 DIAGNOSIS — R29810 Facial weakness: Secondary | ICD-10-CM | POA: Diagnosis not present

## 2014-05-16 LAB — LIPID PANEL
Cholesterol: 218 mg/dL — ABNORMAL HIGH (ref 0–200)
HDL: 57 mg/dL (ref >39)
LDL CALC: 140 mg/dL — AB (ref 0–99)
Total CHOL/HDL Ratio: 3.8 RATIO
Triglycerides: 104 mg/dL (ref <150)
VLDL: 21 mg/dL (ref 0–40)

## 2014-05-16 LAB — HEMOGLOBIN A1C
HEMOGLOBIN A1C: 5.9 % — AB (ref <5.7)
Mean Plasma Glucose: 123 mg/dL — ABNORMAL HIGH (ref <117)

## 2014-05-16 NOTE — Progress Notes (Signed)
Patient [place] home via car with daughter.  DC instructions given to patient and daughter.  Pt's vitals and assessments were stable.

## 2014-05-16 NOTE — Progress Notes (Signed)
NEURO HOSPITALIST PROGRESS NOTE   SUBJECTIVE:                                                                                                                        Kelli [name] is asymptomatic from a neurological standpoint. Daughter is at the bedside. Able to ambulate without difficulty. MRI brain showed no acute intracranial abnormality. MRA brain showed moderate medium and small vessel disease bilaterally, most evident within the a MCA branches. EEG normal.  OBJECTIVE:                                                                                                                           Vital signs in last 24 hours: Temp:  [97.2 F (36.2 C)-98.8 F (37.1 C)] 98.1 F (36.7 C) (08/14 0615) Pulse Rate:  [71-90] 71 (08/14 0615) Resp:  [16-20] 18 (08/14 0615) BP: (91-116)/(36-77) 109/54 mmHg (08/14 0615) SpO2:  [94 %-97 %] 97 % (08/14 0615)  Intake/Output from previous day: 08/13 0701 - 08/14 0700 In: 360 [P.O.:360] Out: -  Intake/Output this shift:   Nutritional status: Cardiac  Past Medical History  Diagnosis Date  . Diverticulosis   . Sigmoid polyp   . Colon polyp     Tubular Adenoma  . Colon polyp, hyperplastic   . Internal hemorrhoid   . Thyroid disease   . Hyperlipidemia   . Anxiety   . Hypertension   . Hypertonicity of bladder   . Insomnia, unspecified   . Flushing   . Other abnormal blood chemistry     Neurologic Exam:  Mental Status:  Alert, oriented, thought content appropriate. Speech fluent without evidence of aphasia. Able to follow 3 step commands without difficulty.  Cranial Nerves:  II: Discs flat bilaterallya; Visual fields grossly normal, pupils equal, round, reactive to light and accommodation  III,IV, VI: ptosis not present, extra-ocular motions intact bilaterally  V,VII: smile asymmetric --left maxillary region is puffy and sensitive to touch, facial light touch sensation normal bilaterally  VIII:  hearing normal bilaterally  IX,X: gag reflex present  XI: bilateral shoulder shrug  XII: midline tongue extension without atrophy or fasciculations  Motor:  Right : Upper extremity 5/5 Left: Upper extremity 5/5  Lower extremity 5/5 Lower extremity  5/5  Tone and bulk:normal tone throughout; no atrophy noted  Sensory: Pinprick and light touch intact throughout, bilaterally  Deep Tendon Reflexes:  Right: Upper Extremity Left: Upper extremity  biceps (C-5 to C-6) 2/4 biceps (C-5 to C-6) 2/4  tricep (C7) 2/4 triceps (C7) 2/4  Brachioradialis (C6) 2/4 Brachioradialis (C6) 2/4  Lower Extremity Lower Extremity  quadriceps (L-2 to L-4) 2/4 quadriceps (L-2 to L-4) 2/4  Achilles (S1) 2/4 Achilles (S1) 2/4  Plantars:  Right: downgoing Left: downgoing  Cerebellar:  normal finger-to-nose, normal heel-to-shin test  Gait: no ataxia.   Lab Results: Lab Results  Component Value Date/Time   CHOL 218* 05/16/2014  3:50 AM   Lipid Panel  Recent Labs  05/16/14 0350  CHOL 218*  TRIG 104  HDL 57  CHOLHDL 3.8  VLDL 21  LDLCALC 140*    Studies/Results: Dg Chest 2 View  05/15/2014   CLINICAL DATA:  Stroke  EXAM: CHEST  2 VIEW  COMPARISON:  01/04/2014  FINDINGS: Cardiomediastinal silhouette is stable. Again noted bilateral breast implants. Chronic mild interstitial prominence. No segmental infiltrate or pulmonary edema. Bony thorax is unremarkable.  IMPRESSION: No active disease.  Probable chronic mild interstitial prominence   Electronically Signed   By: Lahoma Crocker M.D.   On: 05/15/2014 15:43   Ct Head Wo Contrast  05/15/2014   CLINICAL DATA:  Slurred speech.  Left facial droop.  EXAM: CT HEAD WITHOUT CONTRAST  TECHNIQUE: Contiguous axial images were obtained from the base of the skull through the vertex without intravenous contrast.  COMPARISON:  Brain MRI and head CT scan 01/04/2014.  FINDINGS: Atrophy and chronic microvascular ischemic change are identified as on the prior study. No evidence of  acute intracranial abnormality including infarct, hemorrhage, mass lesion, mass effect, midline shift or abnormal extra-axial fluid collection is identified. Bilateral mastoid effusions are unchanged. The calvarium is intact.  IMPRESSION: No acute finding.  Atrophy and chronic microvascular ischemic change.  Bilateral mastoid effusions.   Electronically Signed   By: Inge Rise M.D.   On: 05/15/2014 11:07   Mr Brain Wo Contrast  05/16/2014   CLINICAL DATA:  Abnormal gait and confusion.  Stroke  EXAM: MRI HEAD WITHOUT CONTRAST  MRA HEAD WITHOUT CONTRAST  TECHNIQUE: Multiplanar, multiecho pulse sequences of the brain and surrounding structures were obtained without intravenous contrast. Angiographic images of the head were obtained using MRA technique without contrast.  COMPARISON:  CT head without contrast 05/15/2014 and MRI brain 01/04/2014.  FINDINGS: MRI HEAD FINDINGS  The diffusion-weighted images demonstrate no evidence for acute or subacute infarct. No hemorrhage or mass lesion is present. Mild to moderate atrophy and diffuse white matter disease is present bilaterally. White matter changes extend into the brainstem. Flow is present in the major intracranial arteries. The patient is status post bilateral lens replacements. Globes and orbits are intact. Mild mucosal thickening is present in the maxillary sinuses bilaterally, left greater than right. There is mild anterior ethmoid mucosal thickening as well. Fluid is present in the mastoid air cells bilaterally. No obstructing nasopharyngeal lesions are evident.  MRA HEAD FINDINGS  The internal carotid arteries are tortuous bilaterally. Atherosclerotic changes are present within the cavernous carotid arteries without significant stenosis. The A1 and M1 segments are normal. There is moderate narrowing of proximal and particularly distal MCA branch vessels bilaterally. The A2 segments are unremarkable.  The vertebral arteries are codominant. The right PICA  origin is visualized and normal. The left AICA is dominant. The basilar artery is  within normal limits. Both posterior cerebral arteries originate from the basilar tip. There is moderate attenuation of distal PCA branch vessels bilaterally.  IMPRESSION: 1. No acute intracranial abnormality. 2. Mild to moderate atrophy and white matter disease likely reflects the sequelae of chronic microvascular ischemia. 3. Mild sinus disease. 4. Fluid in the mastoid air cells bilaterally. No obstructing nasopharyngeal lesions are evident. 5. Atherosclerotic changes are present within the cavernous carotid arteries without significant stenosis. 6. The cervical internal carotid arteries are tortuous bilaterally, most commonly seen in the setting of hypertension. 7. Moderate medium and small vessel disease bilaterally, most evident within the a MCA branches.   Electronically Signed   By: Lawrence Santiago M.D.   On: 05/16/2014 11:16   Mr Jodene Nam Head/brain Wo Cm  05/16/2014   CLINICAL DATA:  Abnormal gait and confusion.  Stroke  EXAM: MRI HEAD WITHOUT CONTRAST  MRA HEAD WITHOUT CONTRAST  TECHNIQUE: Multiplanar, multiecho pulse sequences of the brain and surrounding structures were obtained without intravenous contrast. Angiographic images of the head were obtained using MRA technique without contrast.  COMPARISON:  CT head without contrast 05/15/2014 and MRI brain 01/04/2014.  FINDINGS: MRI HEAD FINDINGS  The diffusion-weighted images demonstrate no evidence for acute or subacute infarct. No hemorrhage or mass lesion is present. Mild to moderate atrophy and diffuse white matter disease is present bilaterally. White matter changes extend into the brainstem. Flow is present in the major intracranial arteries. The patient is status post bilateral lens replacements. Globes and orbits are intact. Mild mucosal thickening is present in the maxillary sinuses bilaterally, left greater than right. There is mild anterior ethmoid mucosal thickening  as well. Fluid is present in the mastoid air cells bilaterally. No obstructing nasopharyngeal lesions are evident.  MRA HEAD FINDINGS  The internal carotid arteries are tortuous bilaterally. Atherosclerotic changes are present within the cavernous carotid arteries without significant stenosis. The A1 and M1 segments are normal. There is moderate narrowing of proximal and particularly distal MCA branch vessels bilaterally. The A2 segments are unremarkable.  The vertebral arteries are codominant. The right PICA origin is visualized and normal. The left AICA is dominant. The basilar artery is within normal limits. Both posterior cerebral arteries originate from the basilar tip. There is moderate attenuation of distal PCA branch vessels bilaterally.  IMPRESSION: 1. No acute intracranial abnormality. 2. Mild to moderate atrophy and white matter disease likely reflects the sequelae of chronic microvascular ischemia. 3. Mild sinus disease. 4. Fluid in the mastoid air cells bilaterally. No obstructing nasopharyngeal lesions are evident. 5. Atherosclerotic changes are present within the cavernous carotid arteries without significant stenosis. 6. The cervical internal carotid arteries are tortuous bilaterally, most commonly seen in the setting of hypertension. 7. Moderate medium and small vessel disease bilaterally, most evident within the a MCA branches.   Electronically Signed   By: Lawrence Santiago M.D.   On: 05/16/2014 11:16    MEDICATIONS  Scheduled: . citalopram  40 mg Oral Daily  . enoxaparin (LOVENOX) injection  40 mg Subcutaneous Q24H  . estradiol  1 mg Oral Daily  . irbesartan  150 mg Oral Daily  . levothyroxine  88 mcg Oral QAC breakfast  . medroxyPROGESTERone  5 mg Oral Daily  . meloxicam  15 mg Oral Daily  . simvastatin  40 mg Oral Daily    ASSESSMENT/PLAN:                                                                                                             68 y/o admitted with acute confusional state that had completely resolved. Non focal neuro-exam, unremarkable MRI/MRA brain, normal EEG. Concern that polypharmacy could had played a role in her confusion. No further inpatient neurological intervention required at this moment. Will sign off.  Dorian Pod, MD [facility] [phone]  05/16/2014, 1:28 PM

## 2014-05-16 NOTE — Discharge Instructions (Signed)

## 2014-05-16 NOTE — Progress Notes (Signed)
Per MD IV removed.  Will continue to monitor patient.

## 2014-05-16 NOTE — Evaluation (Signed)
Physical Therapy Evaluation Patient Details Name: Kelli [name] MRN: [id number] DOB: 01/15/1946 Today's Date: 05/16/2014   History of Present Illness  68 y.o. female  history of hypothyroidism, hypertension, depression anxiety. pt adm due to gt abnormalities and confusion.  MRI pending.   Clinical Impression  Pt adm due to above. Presents at baseline and with no focal weaknesses or deficits. Pt educated on signs and symptoms of stroke and importance of early detection. No further acute PT needs warranted at this time. Thanks for the referral.     Follow Up Recommendations No PT follow up    Equipment Recommendations  None recommended by PT    Recommendations for Other Services       Precautions / Restrictions Precautions Precautions: None Restrictions Weight Bearing Restrictions: No      Mobility  Bed Mobility Overal bed mobility: Independent                Transfers Overall transfer level: Independent Equipment used: None             General transfer comment: no LOB or sway  Ambulation/Gait Ambulation/Gait assistance: Independent Ambulation Distance (Feet): 400 Feet Assistive device: None Gait Pattern/deviations: WFL(Within Functional Limits) Gait velocity: WFL Gait velocity interpretation: at or above normal speed for age/gender General Gait Details: see DGI for high level balance activities; no LOB noted  Stairs Stairs: Yes Stairs assistance: Modified independent (Device/Increase time) Stair Management: Two rails;Alternating pattern;Forwards Number of Stairs: 5 General stair comments: no LOB noted; good safety awareness; at baseline   Wheelchair Mobility    Modified Rankin (Stroke Patients Only) Modified Rankin (Stroke Patients Only) Pre-Morbid Rankin Score: No symptoms Modified Rankin: No symptoms     Balance Overall balance assessment: Independent                               Standardized Balance  Assessment Standardized Balance Assessment : Dynamic Gait Index   Dynamic Gait Index Level Surface: Normal Change in Gait Speed: Normal Gait with Horizontal Head Turns: Normal Gait with Vertical Head Turns: Normal Gait and Pivot Turn: Normal Step Over Obstacle: Normal Step Around Obstacles: Normal Steps: Mild Impairment Total Score: 23       Pertinent Vitals/Pain Pain Assessment: No/denies pain    Home Living Family/patient expects to be discharged to:: Private residence Living Arrangements: Alone Available Help at Discharge: Family;Friend(s);Available PRN/intermittently Type of Home: House Home Access: Stairs to enter Entrance Stairs-Rails: Right;Left;Can reach both Entrance Stairs-Number of Steps: 3 Home Layout: One level Home Equipment: Walker - 2 wheels;Cane - single point Additional Comments: has equipment from late husband    Prior Function Level of Independence: Independent         Comments: pt wears glasses; works out at [employer]        Extremity/Trunk Assessment   Upper Extremity Assessment: Overall WFL for tasks assessed           Lower Extremity Assessment: Overall WFL for tasks assessed      Cervical / Trunk Assessment: Normal  Communication   Communication: No difficulties  Cognition Arousal/Alertness: Awake/alert Behavior During Therapy: WFL for tasks assessed/performed Overall Cognitive Status: Within Functional Limits for tasks assessed                      General Comments General comments (skin integrity, edema, etc.): educated on stroke signs and symptoms; given handouts  Exercises        Assessment/Plan    PT Assessment Patent does not need any further PT services  PT Diagnosis     PT Problem List    PT Treatment Interventions     PT Goals (Current goals can be found in the Care Plan section) Acute Rehab PT Goals PT Goal Formulation: No goals set, d/c therapy    Frequency      Barriers to discharge        Co-evaluation               End of Session   Activity Tolerance: Patient tolerated treatment well Patient left: in chair;with call bell/phone within reach Nurse Communication: Mobility status    Functional Assessment Tool Used: clinical judgement Functional Limitation: Mobility: Walking and moving around Mobility: Walking and Moving Around Current Status [phone]): 0 percent impaired, limited or restricted Mobility: Walking and Moving Around Goal Status [phone]): 0 percent impaired, limited or restricted Mobility: Walking and Moving Around Discharge Status [phone]): 0 percent impaired, limited or restricted    Time: 0913-0928 PT Time Calculation (min): 15 min   Charges:   PT Evaluation $Initial PT Evaluation Tier I: 1 Procedure PT Treatments $Gait Training: 8-22 mins   PT G Codes:   Functional Assessment Tool Used: clinical judgement Functional Limitation: Mobility: Walking and moving around    [place], [place], Georgie  [phone] 05/16/2014, 9:46 AM

## 2014-05-16 NOTE — Discharge Summary (Signed)
Physician Discharge Summary  Kelli [name] XTG:[id number] DOB: December 21, 1945 DOA: 05/15/2014  PCP: Hollace Kinnier, DO  Admit date: 05/15/2014 Discharge date: 05/16/2014  Time spent: 30 minutes  Recommendations for Outpatient Follow-up:  Patient will be discharged home. She is to continue following up with her primary care physician within one to 2 weeks. Patient to continue her medications as prescribed. Patient should resume normal physical activity as tolerated. Patient to continue a heart healthy diet.  Discharge Diagnoses:  Mental status/confusion with facial droop and abnormal gait, CVA ruled out Left maxillary facial pain Essential hypertension Hypothyroidism Depression and anxiety Hyperlipidemia  Discharge Condition: Stable  Diet recommendation: heart healthy  Filed Weights   05/15/14 1300  Weight: 64.184 kg (141 lb 8 oz)    History of present illness:  Kelli [name] is a 68 y.o. female with a history of hypothyroidism, hypertension, depression anxiety, that presented to the emergency room as a code stroke. Apparently patient's home alarm went off at approximately 3 AM (the morning of admission), when she found that the cops were at her house. Patient had taken a Xanax prior to going to bed around midnight. Patient felt normal. When the police arrived to her  house, patient noted that her house alarm goes off from time to time due to to the lights outside. Patient stated that the alarm was turned off and then she went back to bed after the police left. Supposedly patient was found walking around her house at around 9 AM by her neighbor who told her she had been walking differently. During admission, no family or friend were available at bedside. Patient denied any dizziness, headache, visual changes at this time. She denied any speech changes. She did complain of some mouth pain. Patient denied any chest pain, shortness of breath, abdominal pain, nausea, vomiting, diarrhea,  recent travel, ill contacts.  Neurology was consulted by the emergency department and felt that patient would benefit from an MRI as well as an EEG.  Hospital Course:  Altered mental status/Confusion with facial droop and abnormal gait  -Appears to have resolved, patient is back to her baseline  -Patient was initially brought in as a code stroke however neurology was consulted and felt her left facial asymmetry secondary to infection  -CT of the head: No acute finding. -MRI/MRA brain: No acute intracranial abnormality. Mild to moderate atrophy and white matter disease likely reflects the sequelae of chronic microvascular ischemia.  Mild sinus disease.  Fluid in the mastoid air cells bilaterally. No obstructing nasopharyngeal lesions are evident.  Atherosclerotic changes are present within the cavernous carotid arteries without significant stenosis. The cervical internal carotid arteries are tortuous bilaterally, most commonly seen in the setting of hypertension. Moderate medium and small vessel disease bilaterally, most evident within the a MCA branches. -Neurology consulted and appreciated -EEG: this is a normal awake EEG -PT consulted and patient does not require further treatment -Speech recommended home health and outpatient  -LDL 140, HbA1c pending (in March 2015 5.9) -Continue statin and aspirin   Left maxillary facial pain  -Patient will need to follow up with her dentist as an outpatient.  -Currently afebrile, no leukocytosis.   Essential Hypertension  -Currently controlled, continue Micardis   Hypothyroidism  -Continue Synthroid   Depression and anxiety  -Continue Xanax and Celexa   Hyperlipidemia  -Lipid panel: Total cholesterol 218, triglycerides 104, HDL 57, LDL 140 -Continue statin -Will need to discuss with PCP possibly changing dose or medication vs diet modifications  Procedures:  EEG: this is a normal awake EEG  Consultations: Neurology  Discharge Exam: Filed  Vitals:   05/16/14 0615  BP: 109/54  Pulse: 71  Temp: 98.1 F (36.7 C)  Resp: 18    Exam General: Well developed, well nourished, NAD, appears stated age  HEENT: NCAT, mucous membranes moist.  Cardiovascular: S1 S2 auscultated, no rubs, murmurs or gallops. Regular rate and rhythm.  Respiratory: Clear to auscultation bilaterally with equal chest rise  Abdomen: Soft, nontender, nondistended, + bowel sounds  Extremities: warm dry without cyanosis clubbing or edema  Neuro: AAOx3, no focal deficits  Psych: Normal affect and demeanor with intact judgement and insight   Discharge Instructions      Discharge Instructions   Discharge instructions    Complete by:  As directed   Patient will be discharged home. She is to continue following up with her primary care physician within one to 2 weeks. Patient to continue her medications as prescribed. Patient should resume normal physical activity as tolerated. Patient to continue a heart healthy diet.            Medication List         ALPRAZolam 1 MG tablet  Commonly known as:  XANAX  Take 1 mg by mouth 2 (two) times daily as needed for anxiety.     citalopram 40 MG tablet  Commonly known as:  CELEXA  Take 1 tablet (40 mg total) by mouth daily. For depression     estradiol 1 MG tablet  Commonly known as:  ESTRACE  Take 1 mg by mouth daily.     levothyroxine 88 MCG tablet  Commonly known as:  SYNTHROID, LEVOTHROID  Take 88 mcg by mouth daily before breakfast.     medroxyPROGESTERone 5 MG tablet  Commonly known as:  PROVERA  Take 5 mg by mouth daily.     meloxicam 15 MG tablet  Commonly known as:  MOBIC  Take 15 mg by mouth daily.     polyethylene glycol packet  Commonly known as:  MIRALAX / GLYCOLAX  Take 17 g by mouth daily as needed for mild constipation.     simvastatin 40 MG tablet  Commonly known as:  ZOCOR  Take 1 tablet (40 mg total) by mouth daily. Take one tablet once daily for cholesterol     telmisartan  40 MG tablet  Commonly known as:  MICARDIS  Take 40 mg by mouth daily.     zolpidem 10 MG tablet  Commonly known as:  AMBIEN  Take 10 mg by mouth at bedtime as needed for sleep.       Allergies  Allergen Reactions  . Latex Swelling   Follow-up Information   Follow up with REED, TIFFANY, DO. Schedule an appointment as soon as possible for a visit in 1 week. [facility] followup)    Specialty:  Geriatric Medicine   Contact information:   [address] [place] [address] [phone]        The results of significant diagnostics from this hospitalization (including imaging, microbiology, ancillary and laboratory) are listed below for reference.    Significant Diagnostic Studies: Dg Chest 2 View  05/15/2014   CLINICAL DATA:  Stroke  EXAM: CHEST  2 VIEW  COMPARISON:  01/04/2014  FINDINGS: Cardiomediastinal silhouette is stable. Again noted bilateral breast implants. Chronic mild interstitial prominence. No segmental infiltrate or pulmonary edema. Bony thorax is unremarkable.  IMPRESSION: No active disease.  Probable chronic mild interstitial prominence   Electronically Signed  By: Lahoma Crocker M.D.   On: 05/15/2014 15:43   Ct Head Wo Contrast  05/15/2014   CLINICAL DATA:  Slurred speech.  Left facial droop.  EXAM: CT HEAD WITHOUT CONTRAST  TECHNIQUE: Contiguous axial images were obtained from the base of the skull through the vertex without intravenous contrast.  COMPARISON:  Brain MRI and head CT scan 01/04/2014.  FINDINGS: Atrophy and chronic microvascular ischemic change are identified as on the prior study. No evidence of acute intracranial abnormality including infarct, hemorrhage, mass lesion, mass effect, midline shift or abnormal extra-axial fluid collection is identified. Bilateral mastoid effusions are unchanged. The calvarium is intact.  IMPRESSION: No acute finding.  Atrophy and chronic microvascular ischemic change.  Bilateral mastoid effusions.   Electronically Signed   By:  Inge Rise M.D.   On: 05/15/2014 11:07   Mr Brain Wo Contrast  05/16/2014   CLINICAL DATA:  Abnormal gait and confusion.  Stroke  EXAM: MRI HEAD WITHOUT CONTRAST  MRA HEAD WITHOUT CONTRAST  TECHNIQUE: Multiplanar, multiecho pulse sequences of the brain and surrounding structures were obtained without intravenous contrast. Angiographic images of the head were obtained using MRA technique without contrast.  COMPARISON:  CT head without contrast 05/15/2014 and MRI brain 01/04/2014.  FINDINGS: MRI HEAD FINDINGS  The diffusion-weighted images demonstrate no evidence for acute or subacute infarct. No hemorrhage or mass lesion is present. Mild to moderate atrophy and diffuse white matter disease is present bilaterally. White matter changes extend into the brainstem. Flow is present in the major intracranial arteries. The patient is status post bilateral lens replacements. Globes and orbits are intact. Mild mucosal thickening is present in the maxillary sinuses bilaterally, left greater than right. There is mild anterior ethmoid mucosal thickening as well. Fluid is present in the mastoid air cells bilaterally. No obstructing nasopharyngeal lesions are evident.  MRA HEAD FINDINGS  The internal carotid arteries are tortuous bilaterally. Atherosclerotic changes are present within the cavernous carotid arteries without significant stenosis. The A1 and M1 segments are normal. There is moderate narrowing of proximal and particularly distal MCA branch vessels bilaterally. The A2 segments are unremarkable.  The vertebral arteries are codominant. The right PICA origin is visualized and normal. The left AICA is dominant. The basilar artery is within normal limits. Both posterior cerebral arteries originate from the basilar tip. There is moderate attenuation of distal PCA branch vessels bilaterally.  IMPRESSION: 1. No acute intracranial abnormality. 2. Mild to moderate atrophy and white matter disease likely reflects the  sequelae of chronic microvascular ischemia. 3. Mild sinus disease. 4. Fluid in the mastoid air cells bilaterally. No obstructing nasopharyngeal lesions are evident. 5. Atherosclerotic changes are present within the cavernous carotid arteries without significant stenosis. 6. The cervical internal carotid arteries are tortuous bilaterally, most commonly seen in the setting of hypertension. 7. Moderate medium and small vessel disease bilaterally, most evident within the a MCA branches.   Electronically Signed   By: Lawrence Santiago M.D.   On: 05/16/2014 11:16   Mr Jodene Nam Head/brain Wo Cm  05/16/2014   CLINICAL DATA:  Abnormal gait and confusion.  Stroke  EXAM: MRI HEAD WITHOUT CONTRAST  MRA HEAD WITHOUT CONTRAST  TECHNIQUE: Multiplanar, multiecho pulse sequences of the brain and surrounding structures were obtained without intravenous contrast. Angiographic images of the head were obtained using MRA technique without contrast.  COMPARISON:  CT head without contrast 05/15/2014 and MRI brain 01/04/2014.  FINDINGS: MRI HEAD FINDINGS  The diffusion-weighted images demonstrate no evidence for acute  or subacute infarct. No hemorrhage or mass lesion is present. Mild to moderate atrophy and diffuse white matter disease is present bilaterally. White matter changes extend into the brainstem. Flow is present in the major intracranial arteries. The patient is status post bilateral lens replacements. Globes and orbits are intact. Mild mucosal thickening is present in the maxillary sinuses bilaterally, left greater than right. There is mild anterior ethmoid mucosal thickening as well. Fluid is present in the mastoid air cells bilaterally. No obstructing nasopharyngeal lesions are evident.  MRA HEAD FINDINGS  The internal carotid arteries are tortuous bilaterally. Atherosclerotic changes are present within the cavernous carotid arteries without significant stenosis. The A1 and M1 segments are normal. There is moderate narrowing of  proximal and particularly distal MCA branch vessels bilaterally. The A2 segments are unremarkable.  The vertebral arteries are codominant. The right PICA origin is visualized and normal. The left AICA is dominant. The basilar artery is within normal limits. Both posterior cerebral arteries originate from the basilar tip. There is moderate attenuation of distal PCA branch vessels bilaterally.  IMPRESSION: 1. No acute intracranial abnormality. 2. Mild to moderate atrophy and white matter disease likely reflects the sequelae of chronic microvascular ischemia. 3. Mild sinus disease. 4. Fluid in the mastoid air cells bilaterally. No obstructing nasopharyngeal lesions are evident. 5. Atherosclerotic changes are present within the cavernous carotid arteries without significant stenosis. 6. The cervical internal carotid arteries are tortuous bilaterally, most commonly seen in the setting of hypertension. 7. Moderate medium and small vessel disease bilaterally, most evident within the a MCA branches.   Electronically Signed   By: Lawrence Santiago M.D.   On: 05/16/2014 11:16    Microbiology: No results found for this or any previous visit (from the past 240 hour(s)).   Labs: Basic Metabolic Panel:  Recent Labs Lab 05/15/14 1056 05/15/14 1107  NA 138 138  K 3.9 3.8  CL 101 103  CO2 24  --   GLUCOSE 108* 112*  BUN 14 13  CREATININE 0.78 0.80  CALCIUM 8.6  --    Liver Function Tests:  Recent Labs Lab 05/15/14 1056  AST 19  ALT 12  ALKPHOS 63  BILITOT 0.5  PROT 7.5  ALBUMIN 3.4*   No results found for this basename: LIPASE, AMYLASE,  in the last 168 hours No results found for this basename: AMMONIA,  in the last 168 hours CBC:  Recent Labs Lab 05/15/14 1056 05/15/14 1107  WBC 8.0  --   NEUTROABS 5.4  --   HGB 11.1* 12.2  HCT 32.5* 36.0  MCV 91.8  --   PLT 199  --    Cardiac Enzymes: No results found for this basename: CKTOTAL, CKMB, CKMBINDEX, TROPONINI,  in the last 168  hours BNP: BNP (last 3 results) No results found for this basename: PROBNP,  in the last 8760 hours CBG:  Recent Labs Lab 05/15/14 1038 05/15/14 1557  GLUCAP 117* 99       Signed:  Dillan Lunden  [facility] 05/16/2014, 2:05 PM

## 2014-05-16 NOTE — Progress Notes (Signed)
UR Completed Elysha Daw Graves-Bigelow, RN,BSN [phone]  

## 2014-05-16 NOTE — Evaluation (Signed)
Speech Language Pathology Evaluation Patient Details Name: Kelli [name] MRN: [id number] DOB: 1946-03-04 Today's Date: 05/16/2014 Time: 1115-1200 SLP Time Calculation (min): 45 min  Problem List:  Patient Active Problem List   Diagnosis Date Noted  . Facial droop 05/15/2014  . TIA (transient ischemic attack) 05/15/2014  . Hyperlipidemia LDL goal < 100 12/26/2013  . Essential hypertension, benign 12/26/2013  . Hypothyroidism 05/27/2013  . Seasonal allergies 05/27/2013  . Depressive disorder, not elsewhere classified 05/27/2013  . Prediabetes 05/27/2013  . Hyperlipidemia   . HYPERTENSION 09/19/2007  . DYSPNEA 09/19/2007   Past Medical History:  Past Medical History  Diagnosis Date  . Diverticulosis   . Sigmoid polyp   . Colon polyp     Tubular Adenoma  . Colon polyp, hyperplastic   . Internal hemorrhoid   . Thyroid disease   . Hyperlipidemia   . Anxiety   . Hypertension   . Hypertonicity of bladder   . Insomnia, unspecified   . Flushing   . Other abnormal blood chemistry    Past Surgical History:  Past Surgical History  Procedure Laterality Date  . Ectopic pregnancy surgery  1965  . Breast enhancement surgery  1969  . Cholecystectomy    . Rotator cuff repair  20131   HPI:  68 year old female admitted 05/15/14 due to abnormal gait and confusion. CT and MRI negative for acute infarct. SLE ordered to evaluate cognitive-linguistic function.   Assessment / Plan / Recommendation Clinical Impression  The Montreal Cognitive Assessment (MoCA) was administered. Pt scored 23/30 (n/>25/30), with errors noted in visuospatial/executive, sustained and selective attention, mental flexibility and delayed recall.  These deficits have implication for potential  difficulties living independently, as all areas of error involve high level cognitive processes.  At this time, it is recommended that outpatient or home health speech therapy be pursued to address high level cognitive skills  for continued safety and independence at home alone. Pt reported recent errors with functional financial management (for example, she paid $3,700 rather than $37).  Additionally, pt reported not sleeping well, but fearing her sleeping meds will be taken away if independent management of medication becomes problematic.  Daughter indicated that she would be able to stay with pt around the clock for a period of time after DC.     SLP Assessment  All further Speech Lanaguage Pathology  needs can be addressed in the next venue of care    Follow Up Recommendations  Home health SLP;Outpatient SLP    Frequency and Duration   defer to home health or OP therapist     Pertinent Vitals/Pain Pain Assessment: No/denies pain   SLP Goals   per HH/OP ST  SLP Evaluation Prior Functioning  Cognitive/Linguistic Baseline: Within functional limits Type of Home: House  Lives With: Alone Available Help at Discharge: Family;Friend(s);Available PRN/intermittently Education: high school graduate Vocation: Retired   Associate Professor  Overall Cognitive Status: Impaired/Different from baseline Arousal/Alertness: Awake/alert Orientation Level: Oriented X4 Attention: Focused;Sustained;Selective Focused Attention: Appears intact Sustained Attention: Impaired Sustained Attention Impairment: Verbal complex;Functional complex Selective Attention: Impaired Selective Attention Impairment: Verbal basic;Verbal complex;Functional basic;Functional complex Memory: Impaired Memory Impairment: Retrieval deficit;Decreased recall of new information Awareness: Impaired Problem Solving: Impaired Problem Solving Impairment: Verbal complex;Functional complex Executive Function: Writer: Impaired Organizing Impairment: Verbal complex;Functional complex Safety/Judgment: Impaired    Comprehension  Auditory Comprehension Overall Auditory Comprehension: Appears within functional limits for tasks assessed Visual  Recognition/Discrimination Discrimination: Not tested Reading Comprehension Reading Status: Not tested  Expression Expression Primary Mode of Expression: Verbal Verbal Expression Overall Verbal Expression: Appears within functional limits for tasks assessed Written Expression Dominant Hand: Right Written Expression: Not tested   Oral / Motor Oral Motor/Sensory Function Overall Oral Motor/Sensory Function: Appears within functional limits for tasks assessed Motor Speech Overall Motor Speech: Appears within functional limits for tasks assessed   GO Functional Assessment Tool Used: MoCA, ASHA NOMS, clinical judgment Functional Limitations: Memory Memory Current Status [phone]): At least 20 percent but less than 40 percent impaired, limited or restricted Memory Goal Status (C1448): At least 20 percent but less than 40 percent impaired, limited or restricted Memory Discharge Status [phone]): At least 20 percent but less than 40 percent impaired, limited or restricted   Delmy Holdren B. [facility], CCC-SLP [phone]  Shonna Chock 05/16/2014, 12:11 PM

## 2014-05-16 NOTE — Procedures (Signed)
EEG report.  Brief clinical history:  68 y.o. female with unknown etiology of confusion.  Technique: this is a 17 channel routine scalp EEG performed at the bedside with bipolar and monopolar montages arranged in accordance to the international 10/20 system of electrode placement. One channel was dedicated to EKG recording.  No drowsiness or sleep achieved during this recording. No activating procedures performed.  Description:In the wakeful state, the best background consisted of a medium amplitude, posterior dominant, well sustained, symmetric and reactive 10 Hz rhythm. No focal or generalized epileptiform discharges noted.  No pathologic areas of slowing seen.  EKG showed sinus rhythm.  Impression: this is a normal awake EEG. Please, be aware that a normal EEG does not exclude the possibility of epilepsy.  Clinical correlation is advised.   Dorian Pod, MD

## 2014-05-28 DIAGNOSIS — H519 Unspecified disorder of binocular movement: Secondary | ICD-10-CM | POA: Diagnosis not present

## 2014-05-28 DIAGNOSIS — H52229 Regular astigmatism, unspecified eye: Secondary | ICD-10-CM | POA: Diagnosis not present

## 2014-05-30 DIAGNOSIS — Z961 Presence of intraocular lens: Secondary | ICD-10-CM | POA: Diagnosis not present

## 2014-05-30 DIAGNOSIS — E119 Type 2 diabetes mellitus without complications: Secondary | ICD-10-CM | POA: Diagnosis not present

## 2014-05-30 DIAGNOSIS — H5210 Myopia, unspecified eye: Secondary | ICD-10-CM | POA: Diagnosis not present

## 2014-05-30 DIAGNOSIS — H40009 Preglaucoma, unspecified, unspecified eye: Secondary | ICD-10-CM | POA: Diagnosis not present

## 2014-05-30 DIAGNOSIS — H524 Presbyopia: Secondary | ICD-10-CM | POA: Diagnosis not present

## 2014-05-30 DIAGNOSIS — H52229 Regular astigmatism, unspecified eye: Secondary | ICD-10-CM | POA: Diagnosis not present

## 2014-06-02 ENCOUNTER — Ambulatory Visit (INDEPENDENT_AMBULATORY_CARE_PROVIDER_SITE_OTHER): Payer: Medicare Other | Admitting: Internal Medicine

## 2014-06-02 ENCOUNTER — Encounter: Payer: Self-pay | Admitting: Internal Medicine

## 2014-06-02 VITALS — BP 118/70 | HR 77 | Temp 98.1°F | Resp 18 | Ht <= 58 in | Wt 144.2 lb

## 2014-06-02 DIAGNOSIS — F513 Sleepwalking [somnambulism]: Secondary | ICD-10-CM

## 2014-06-02 DIAGNOSIS — G478 Other sleep disorders: Secondary | ICD-10-CM

## 2014-06-02 DIAGNOSIS — Z5189 Encounter for other specified aftercare: Secondary | ICD-10-CM

## 2014-06-02 DIAGNOSIS — G4700 Insomnia, unspecified: Secondary | ICD-10-CM

## 2014-06-02 DIAGNOSIS — F4323 Adjustment disorder with mixed anxiety and depressed mood: Secondary | ICD-10-CM | POA: Diagnosis not present

## 2014-06-02 DIAGNOSIS — T50901D Poisoning by unspecified drugs, medicaments and biological substances, accidental (unintentional), subsequent encounter: Secondary | ICD-10-CM

## 2014-06-02 DIAGNOSIS — T50901A Poisoning by unspecified drugs, medicaments and biological substances, accidental (unintentional), initial encounter: Secondary | ICD-10-CM

## 2014-06-02 NOTE — Progress Notes (Signed)
Patient ID: Kelli [name], female   DOB: May 06, 1946, 68 y.o.   MRN: [id number]   Location:  [facility]  Code Status:  Full code, previously given information to complete a hcpoa and living will, but not done yet  Allergies  Allergen Reactions  . Latex Swelling    Chief Complaint  Patient presents with  . Hospitalization Follow-up    HPI: Patient is a 68 y.o. white female seen in the office today for hospital f/u after 8/13-8/14 visit with change in mental status, facial droop and abnormal gait.  Stroke was ruled out.  Her left facial droop was thought to be due to infection.  Moderate small vessel ischemic disease, normal EEG. She was to f/u with her dentist outpatient.    Bad cholesterol was above goal and meds need changing now. Will need to change her to crestor as zocor is ineffective at 40mg .    Tells me that she was taking zquil with her ambien and xanax to sleep back over Easter last year.  This year she had another episode of confusion.  Now she had this latest episode where she went to the ED above, but she apparently overdosed on ambien in hindsight along with her xanax.  She was not using a pillbox or any specific way to keep track of her pills.    Having issues with her daughter and made grandson beneficiary on policy.  Is trying to tell patient she has alzheimer's so she can get her money.         Has gained 20 lbs since April and it's a lot harder to get back to the gym now.  Says she is going back beginning tomorrow.  Is going to take a balance class.    Now using pillbox and has each pill on paper with label of what it is, what it's for and Dr.'s name who prescribes.  She is going to get a life alert button.  Review of Systems:  Review of Systems  Constitutional: Negative for fever.  HENT: Negative for congestion.   Eyes: Negative for blurred vision.  Respiratory: Negative for shortness of breath.   Cardiovascular:  Negative for chest pain.  Gastrointestinal: Negative for constipation.  Genitourinary: Negative for dysuria, urgency and frequency.  Musculoskeletal: Positive for falls.  Skin: Negative for rash.  Neurological: Negative for dizziness, loss of consciousness and weakness.  Psychiatric/Behavioral: Negative for depression, suicidal ideas, hallucinations and memory loss. The patient is nervous/anxious and has insomnia.     Past Medical History  Diagnosis Date  . Diverticulosis   . Sigmoid polyp   . Colon polyp     Tubular Adenoma  . Colon polyp, hyperplastic   . Internal hemorrhoid   . Thyroid disease   . Hyperlipidemia   . Anxiety   . Hypertension   . Hypertonicity of bladder   . Insomnia, unspecified   . Flushing   . Other abnormal blood chemistry     Past Surgical History  Procedure Laterality Date  . Ectopic pregnancy surgery  1965  . Breast enhancement surgery  1969  . Cholecystectomy    . Rotator cuff repair  2013    Social History:   reports that she has quit smoking. She does not have any smokeless tobacco history on file. She reports that she does not drink alcohol or use illicit drugs.  Family History  Problem Relation Age of Onset  . Cancer Mother  lung  . Stroke Father     Medications: Patient's Medications  New Prescriptions   No medications on file  Previous Medications   ALPRAZOLAM (XANAX) 1 MG TABLET    Take 1 mg by mouth 2 (two) times daily as needed for anxiety.   CITALOPRAM (CELEXA) 40 MG TABLET    Take 1 tablet (40 mg total) by mouth daily. For depression   ESTRADIOL (ESTRACE) 1 MG TABLET    Take 1 mg by mouth daily.   LEVOTHYROXINE (SYNTHROID, LEVOTHROID) 88 MCG TABLET    Take 88 mcg by mouth daily before breakfast.   MEDROXYPROGESTERONE (PROVERA) 5 MG TABLET    Take 5 mg by mouth daily.   MELOXICAM (MOBIC) 15 MG TABLET    Take 15 mg by mouth daily.   POLYETHYLENE GLYCOL (MIRALAX / GLYCOLAX) PACKET    Take 17 g by mouth daily as needed for  mild constipation.   SIMVASTATIN (ZOCOR) 40 MG TABLET    Take 1 tablet (40 mg total) by mouth daily. Take one tablet once daily for cholesterol   TELMISARTAN (MICARDIS) 40 MG TABLET    Take 40 mg by mouth daily.   ZOLPIDEM (AMBIEN) 10 MG TABLET    Take 10 mg by mouth at bedtime as needed for sleep.  Modified Medications   No medications on file  Discontinued Medications   No medications on file     Physical Exam: Filed Vitals:   06/02/14 1432  BP: 118/70  Pulse: 77  Temp: 98.1 F (36.7 C)  TempSrc: Oral  Resp: 18  Height: 4\' 9"  (1.448 m)  Weight: 144 lb 3.2 oz (65.409 kg)  SpO2: 98%  Physical Exam  Constitutional: She is oriented to person, place, and time. She appears well-developed and well-nourished. No distress.  Cardiovascular: Normal rate, regular rhythm, normal heart sounds and intact distal pulses.   Pulmonary/Chest: Effort normal and breath sounds normal. No respiratory distress.  Abdominal: Soft. Bowel sounds are normal. She exhibits no distension and no mass. There is no tenderness.  Musculoskeletal: Normal range of motion. She exhibits no edema and no tenderness.  Neurological: She is alert and oriented to person, place, and time.  Skin: Skin is warm and dry.  Psychiatric: Thought content normal.  Anxious, talking fast, talked for about 45 mins straight     Labs reviewed: Basic Metabolic Panel:  Recent Labs  12/24/13 1504 01/04/14 1110 05/15/14 1056 05/15/14 1107  NA 143 138 138 138  K 4.6 4.7 3.9 3.8  CL 105 97 101 103  CO2 24 27 24   --   GLUCOSE 92 116* 108* 112*  BUN 14 14 14 13   CREATININE 0.76 0.67 0.78 0.80  CALCIUM 9.2 9.2 8.6  --    Liver Function Tests:  Recent Labs  12/24/13 1504 01/04/14 1110 05/15/14 1056  AST 14 38* 19  ALT 12 15 12   ALKPHOS 53 57 63  BILITOT <0.2 <0.2* 0.5  PROT 7.0 7.5 7.5  ALBUMIN  --  3.6 3.4*   No results found for this basename: LIPASE, AMYLASE,  in the last 8760 hours No results found for this  basename: AMMONIA,  in the last 8760 hours CBC:  Recent Labs  01/04/14 1110 05/15/14 1056 05/15/14 1107  WBC 8.7 8.0  --   NEUTROABS 6.5 5.4  --   HGB 11.5* 11.1* 12.2  HCT 33.0* 32.5* 36.0  MCV 93.8 91.8  --   PLT 230 199  --    Lipid Panel:  Recent Labs  12/24/13 1504 05/16/14 0350  CHOL  --  218*  HDL 60 57  LDLCALC 129* 140*  TRIG 113 104  CHOLHDL 3.5 3.8   Lab Results  Component Value Date   HGBA1C 5.9* 05/16/2014    Past Procedures: Hospital labs and imaging reviewed from 8/13-14  Assessment/Plan 1. Sleepwalking -due to OD of ambien she says -would like to stop [place] and try belsomra instead, but cost prohibitive at this time  2. Insomnia -has been on ambien 10mg  and xanax 1mg  at bedtime for this at her request despite risks in her age group -I again reviewed my concerns with her and she continues to want to take these medications  3. Adjustment disorder with mixed anxiety and depressed mood -cont celexa  -would favor changing it also to try an alternate that might help decrease her need for xanax and ambien  4. Medication overdose, accidental or unintentional, subsequent encounter -would prefer she be off ambien and xanax, but she is resistant to this--will need to check UDS next time -clearly was not a suicide attempt, but accidental and she has changed her method of taking her medications so it cannot happen again -has a very supportive neighbor also -was also reminded not to take any other meds not prescribed or recommended by me (prescription or otc)  Labs/tests ordered: as ordered Next appt:  As scheduled with labs before;  UDS day of next visit thru genetics

## 2014-06-02 NOTE — Patient Instructions (Signed)
Call insurance and tell me if you can get belsomra paid for.

## 2014-06-06 ENCOUNTER — Telehealth: Payer: Self-pay | Admitting: *Deleted

## 2014-06-06 NOTE — Telephone Encounter (Signed)
Patient called and stated that the Dr. Gabriel Carina will have to call and get Prior Auth for [place]. We need to call # [phone]. Spoke with Lajuana Matte and  Belsomra 10mg . Approved until December 2016. Copay will 50% of the cost of the medication.  LMOM for patient to return call.

## 2014-06-10 ENCOUNTER — Other Ambulatory Visit: Payer: Self-pay | Admitting: *Deleted

## 2014-06-10 MED ORDER — SUVOREXANT 10 MG PO TABS: 1.0000 | ORAL_TABLET | Freq: Every evening | ORAL | Status: DC | PRN

## 2014-06-10 NOTE — Telephone Encounter (Signed)
Patient Notified and Rx called to pharmacy

## 2014-06-10 NOTE — Telephone Encounter (Signed)
Patient Requested 

## 2014-06-13 ENCOUNTER — Encounter: Payer: Self-pay | Admitting: Internal Medicine

## 2014-06-16 ENCOUNTER — Telehealth: Payer: Self-pay | Admitting: *Deleted

## 2014-06-16 NOTE — Telephone Encounter (Signed)
I really don't feel comfortable giving her the zolpidem again after she sleep walked with it and took too much of it.  The other medicines are just as dangerous other than the belsomra.  We could give her some samples of the belsomra, but that will only be a temporary solution.

## 2014-06-16 NOTE — Telephone Encounter (Signed)
Patient called and left message that even with the prior auth. For the [place] it is still going to cost her $134.00. Patient cannot afford this and wants to go back on the Zolpidem. Please Advise.

## 2014-06-17 NOTE — Telephone Encounter (Signed)
Patient Notified and agreed #21 tablets given to patient.

## 2014-06-18 ENCOUNTER — Other Ambulatory Visit: Payer: Self-pay | Admitting: Internal Medicine

## 2014-06-24 ENCOUNTER — Other Ambulatory Visit: Payer: Medicare Other

## 2014-06-26 ENCOUNTER — Ambulatory Visit: Payer: Medicare Other | Admitting: Internal Medicine

## 2014-06-26 ENCOUNTER — Encounter: Payer: Self-pay | Admitting: Internal Medicine

## 2014-06-26 DIAGNOSIS — Z0289 Encounter for other administrative examinations: Secondary | ICD-10-CM

## 2014-07-16 ENCOUNTER — Telehealth: Payer: Self-pay | Admitting: *Deleted

## 2014-07-16 NOTE — Telephone Encounter (Signed)
Patient sent Dr. Mariea Clonts a letter through the mail stating that she is having trouble sleeping even after taking 3 Alprazolam. She is requesting Dr. Mariea Clonts to write her a Rx for Zolpidem 10mg . She stated that she had gotten this when she was in the hospital and released and it helped good.  Gave Dr. Mariea Clonts the letter and waiting a response.

## 2014-07-17 NOTE — Telephone Encounter (Signed)
Per Dr.Reed- Ask patient which dose of Belsomra she tried? Patient will probably need a higher dose for efficacy   Left message on voicemail for patient to return call when available, reason for call was to discuss Dr.Reed's Response

## 2014-07-18 ENCOUNTER — Other Ambulatory Visit: Payer: Self-pay | Admitting: Internal Medicine

## 2014-07-18 NOTE — Telephone Encounter (Signed)
Patient was given Belsomra 10mg . The pharmacist told her that it was going to cost her $ 134.00 with Insurance. Patient wants something else prescribed. Please Advise.

## 2014-07-21 NOTE — Telephone Encounter (Signed)
LMOM to return call.

## 2014-07-21 NOTE — Telephone Encounter (Signed)
There aren't any safe sedative-hypnotic medications in her age group.

## 2014-07-22 NOTE — Telephone Encounter (Signed)
Patient wants to know if she can try Melantonin? Please Advise.

## 2014-07-23 NOTE — Telephone Encounter (Signed)
LMOM for patient to Return call.  

## 2014-07-23 NOTE — Telephone Encounter (Signed)
That's fine.  I thought she did, but that's the only safe alternative other than the belsomra with her history.  Melatonin 3mg  and may go up to 5mg  if needed.  Should be taken 1 hour before she wants to be asleep.

## 2014-07-31 ENCOUNTER — Other Ambulatory Visit: Payer: Medicare Other

## 2014-08-04 ENCOUNTER — Encounter: Payer: Self-pay | Admitting: Internal Medicine

## 2014-08-04 ENCOUNTER — Ambulatory Visit (INDEPENDENT_AMBULATORY_CARE_PROVIDER_SITE_OTHER): Payer: Medicare Other | Admitting: *Deleted

## 2014-08-04 ENCOUNTER — Ambulatory Visit (INDEPENDENT_AMBULATORY_CARE_PROVIDER_SITE_OTHER): Payer: Medicare Other | Admitting: Internal Medicine

## 2014-08-04 VITALS — BP 109/78 | HR 79 | Temp 97.7°F | Resp 18 | Ht 58.8 in | Wt 144.8 lb

## 2014-08-04 DIAGNOSIS — I10 Essential (primary) hypertension: Secondary | ICD-10-CM | POA: Diagnosis not present

## 2014-08-04 DIAGNOSIS — F32A Depression, unspecified: Secondary | ICD-10-CM

## 2014-08-04 DIAGNOSIS — Z23 Encounter for immunization: Secondary | ICD-10-CM

## 2014-08-04 DIAGNOSIS — F418 Other specified anxiety disorders: Secondary | ICD-10-CM

## 2014-08-04 DIAGNOSIS — G4700 Insomnia, unspecified: Secondary | ICD-10-CM | POA: Diagnosis not present

## 2014-08-04 DIAGNOSIS — F329 Major depressive disorder, single episode, unspecified: Secondary | ICD-10-CM

## 2014-08-04 DIAGNOSIS — F419 Anxiety disorder, unspecified: Secondary | ICD-10-CM

## 2014-08-04 DIAGNOSIS — R7309 Other abnormal glucose: Secondary | ICD-10-CM

## 2014-08-04 DIAGNOSIS — E785 Hyperlipidemia, unspecified: Secondary | ICD-10-CM

## 2014-08-04 DIAGNOSIS — E039 Hypothyroidism, unspecified: Secondary | ICD-10-CM | POA: Diagnosis not present

## 2014-08-04 DIAGNOSIS — R7303 Prediabetes: Secondary | ICD-10-CM

## 2014-08-04 MED ORDER — ZOLPIDEM TARTRATE 5 MG PO TABS: 5.0000 mg | ORAL_TABLET | Freq: Every evening | ORAL | Status: DC | PRN

## 2014-08-04 NOTE — Progress Notes (Signed)
Patient ID: Kelli [name], female   DOB: [birth year], 68 y.o.   MRN: [id number]   Location:  [facility]  Code Status: full code  Allergies  Allergen Reactions  . Latex Swelling    Chief Complaint  Patient presents with  . Medical Management of Chronic Issues    HPI: Patient is a 68 y.o. white female seen in the office today for med mgt chronic diseases  Things are good except for sleep.  Tried to go w/o tv at bedtime and turn all lights out but says it didn't work.  Stayed awake all night or slept just a little with dreams--then would be up a few hours, then nap.  Senior center activities are all in the morning but can't do them b/c that's when she's tired.  Using 2 alprazolam and 2 melatonin 6mg  with something added to it.  Had said she would not take anything but [place] after she had been at the hospital.  Says she took one and slept and felt like a new person afterwards.  Had only 9 pills, so then started back with xanax.  Cost of belsomra was too much.    Has not been to gym in a couple of mos, but [employer] opened [place].  Has joined.  Is closer and 1/3 the cost.  As soon as it officially opens 11/14, she will get back there.    Says she has the [place] safely stored away now and wants to retry.    Review of Systems:  Review of Systems  Constitutional: Negative for fever.  HENT: Negative for congestion.   Eyes: Negative for blurred vision.  Respiratory: Negative for shortness of breath.   Cardiovascular: Negative for chest pain.  Gastrointestinal: Negative for abdominal pain.  Genitourinary: Negative for dysuria.  Musculoskeletal: Negative for myalgias and falls.  Skin: Negative for rash.  Neurological: Negative for dizziness and weakness.  Endo/Heme/Allergies: Does not bruise/bleed easily.  Psychiatric/Behavioral: Positive for depression. The patient is nervous/anxious and has insomnia.     Past  Medical History  Diagnosis Date  . Diverticulosis   . Sigmoid polyp   . Colon polyp     Tubular Adenoma  . Colon polyp, hyperplastic   . Internal hemorrhoid   . Thyroid disease   . Hyperlipidemia   . Anxiety   . Hypertension   . Hypertonicity of bladder   . Insomnia, unspecified   . Flushing   . Other abnormal blood chemistry     Past Surgical History  Procedure Laterality Date  . Ectopic pregnancy surgery  1965  . Breast enhancement surgery  1969  . Cholecystectomy    . Rotator cuff repair  2013    Social History:   reports that she has quit smoking. She does not have any smokeless tobacco history on file. She reports that she does not drink alcohol or use illicit drugs.  Family History  Problem Relation Age of Onset  . Cancer Mother     lung  . Stroke Father     Medications: Patient's Medications  New Prescriptions   No medications on file  Previous Medications   ALPRAZOLAM (XANAX) 1 MG TABLET    TAKE TWO TABLETS BY MOUTH AT BEDTIME AS NEEDED FOR ANXIETY   CITALOPRAM (CELEXA) 40 MG TABLET    Take 1 tablet (40 mg total) by mouth daily. For depression   ESTRADIOL (ESTRACE) 1 MG TABLET    Take 1  mg by mouth daily.   LEVOTHYROXINE (SYNTHROID, LEVOTHROID) 88 MCG TABLET    Take 88 mcg by mouth daily before breakfast.   MEDROXYPROGESTERONE (PROVERA) 5 MG TABLET    Take 5 mg by mouth daily.   MELOXICAM (MOBIC) 15 MG TABLET    Take 15 mg by mouth daily.   POLYETHYLENE GLYCOL (MIRALAX / GLYCOLAX) PACKET    Take 17 g by mouth daily as needed for mild constipation.   SIMVASTATIN (ZOCOR) 40 MG TABLET    Take 1 tablet (40 mg total) by mouth daily. Take one tablet once daily for cholesterol   SUVOREXANT (BELSOMRA) 10 MG TABS    Take 1 tablet by mouth at bedtime as needed. For rest   TELMISARTAN (MICARDIS) 40 MG TABLET    Take 40 mg by mouth daily.  Modified Medications   No medications on file  Discontinued Medications   No medications on file    Physical Exam: Filed  Vitals:   08/04/14 1547  BP: 109/78  Pulse: 79  Temp: 97.7 F (36.5 C)  TempSrc: Oral  Resp: 18  Height: 4' 10.8" (1.494 m)  Weight: 144 lb 12.8 oz (65.681 kg)  SpO2: 97%  Physical Exam  Constitutional: She is oriented to person, place, and time. She appears well-developed and well-nourished. No distress.  Cardiovascular: Normal rate, regular rhythm, normal heart sounds and intact distal pulses.   Pulmonary/Chest: Effort normal and breath sounds normal.  Abdominal: Soft. Bowel sounds are normal. She exhibits no distension and no mass. There is no tenderness.  Musculoskeletal: Normal range of motion.  Neurological: She is alert and oriented to person, place, and time.  Psychiatric:  anxious   Labs reviewed: Basic Metabolic Panel:  Recent Labs  12/24/13 1504 01/04/14 1110 05/15/14 1056 05/15/14 1107  NA 143 138 138 138  K 4.6 4.7 3.9 3.8  CL 105 97 101 103  CO2 24 27 24   --   GLUCOSE 92 116* 108* 112*  BUN 14 14 14 13   CREATININE 0.76 0.67 0.78 0.80  CALCIUM 9.2 9.2 8.6  --    Liver Function Tests:  Recent Labs  12/24/13 1504 01/04/14 1110 05/15/14 1056  AST 14 38* 19  ALT 12 15 12   ALKPHOS 53 57 63  BILITOT <0.2 <0.2* 0.5  PROT 7.0 7.5 7.5  ALBUMIN  --  3.6 3.4*  CBC:  Recent Labs  01/04/14 1110 05/15/14 1056 05/15/14 1107  WBC 8.7 8.0  --   NEUTROABS 6.5 5.4  --   HGB 11.5* 11.1* 12.2  HCT 33.0* 32.5* 36.0  MCV 93.8 91.8  --   PLT 230 199  --    Lipid Panel:  Recent Labs  12/24/13 1504 05/16/14 0350  CHOL  --  218*  HDL 60 57  LDLCALC 129* 140*  TRIG 113 104  CHOLHDL 3.5 3.8   Lab Results  Component Value Date   HGBA1C 5.9* 05/16/2014    Assessment/Plan 1. Insomnia - will give one month of 5mg  pills -is to lock them up so she can't accidentally take more in her sleep  -I will not give her 10mg  pills b/c they are not recommended at her age and she had problems with that dose before - zolpidem (AMBIEN) 5 MG tablet; Take 1 tablet (5  mg total) by mouth at bedtime as needed for sleep.  Dispense: 30 tablet; Refill: 0 - CBC With differential/Platelet; Future  2. Prediabetes -f/u labs in 2 mos, feet benign today - CBC With  differential/Platelet; Future - Comprehensive metabolic panel; Future - Hemoglobin A1c; Future - Lipid panel; Future -get back to gym  3. Hypothyroidism, unspecified hypothyroidism type -cont synthroid - CBC With differential/Platelet; Future - TSH; Future  4. Essential hypertension, benign -bp at goal, no changes, get back to gym - CBC With differential/Platelet; Future  5. Anxiety and depression - continues on xanax at bedtime, but does not use 2 every night only if feels anxious--would favor taper off but this has not been successful thus far - CBC With differential/Platelet; Future  6. Hyperlipidemia -cont zocor 40mg  at bedtime - Lipid panel; Future  7. Need for prophylactic vaccination and inoculation against influenza -flu shot given today  Labs/tests ordered:   Orders Placed This Encounter  Procedures  . CBC With differential/Platelet    Standing Status: Future     Number of Occurrences:      Standing Expiration Date: 02/02/2015  . Comprehensive metabolic panel    Standing Status: Future     Number of Occurrences:      Standing Expiration Date: 02/02/2015    Order Specific Question:  Has the patient fasted?    Answer:  Yes  . Hemoglobin A1c    Standing Status: Future     Number of Occurrences:      Standing Expiration Date: 02/02/2015  . Lipid panel    Standing Status: Future     Number of Occurrences:      Standing Expiration Date: 02/02/2015    Order Specific Question:  Has the patient fasted?    Answer:  Yes  . TSH    Standing Status: Future     Number of Occurrences:      Standing Expiration Date: 02/02/2015   Next appt:  2 mos  Kerline Trahan L. Jacobb Alen, D.O. [facility] [address], [place] [address] Cell Phone (Mon-Fri  8am-5pm):  [phone] On Call:  [phone] & follow prompts after 5pm & weekends Office Phone:  [phone] Office Fax:  [phone]

## 2014-08-05 NOTE — Telephone Encounter (Signed)
Patient discussed at [place] 08/05/2014 and per Dr. Mariea Clonts Ambien 5mg  given.

## 2014-08-18 ENCOUNTER — Other Ambulatory Visit: Payer: Self-pay | Admitting: Internal Medicine

## 2014-08-22 ENCOUNTER — Other Ambulatory Visit: Payer: Self-pay | Admitting: *Deleted

## 2014-08-22 MED ORDER — ALPRAZOLAM 1 MG PO TABS: ORAL_TABLET | ORAL | Status: DC

## 2014-09-02 ENCOUNTER — Other Ambulatory Visit: Payer: Self-pay | Admitting: Internal Medicine

## 2014-09-21 ENCOUNTER — Other Ambulatory Visit: Payer: Self-pay | Admitting: Internal Medicine

## 2014-09-22 ENCOUNTER — Other Ambulatory Visit: Payer: Self-pay | Admitting: *Deleted

## 2014-09-22 ENCOUNTER — Other Ambulatory Visit: Payer: Self-pay | Admitting: Nurse Practitioner

## 2014-09-22 NOTE — Telephone Encounter (Signed)
[facility] 

## 2014-10-03 ENCOUNTER — Other Ambulatory Visit: Payer: Self-pay | Admitting: Internal Medicine

## 2014-10-06 ENCOUNTER — Other Ambulatory Visit: Payer: Self-pay | Admitting: Internal Medicine

## 2014-10-06 ENCOUNTER — Other Ambulatory Visit: Payer: Medicare Other

## 2014-10-06 DIAGNOSIS — E785 Hyperlipidemia, unspecified: Secondary | ICD-10-CM | POA: Diagnosis not present

## 2014-10-06 DIAGNOSIS — E039 Hypothyroidism, unspecified: Secondary | ICD-10-CM

## 2014-10-06 DIAGNOSIS — R7309 Other abnormal glucose: Secondary | ICD-10-CM | POA: Diagnosis not present

## 2014-10-06 DIAGNOSIS — I10 Essential (primary) hypertension: Secondary | ICD-10-CM

## 2014-10-06 DIAGNOSIS — F329 Major depressive disorder, single episode, unspecified: Secondary | ICD-10-CM

## 2014-10-06 DIAGNOSIS — G4700 Insomnia, unspecified: Secondary | ICD-10-CM | POA: Diagnosis not present

## 2014-10-06 DIAGNOSIS — R7303 Prediabetes: Secondary | ICD-10-CM

## 2014-10-06 DIAGNOSIS — F341 Dysthymic disorder: Secondary | ICD-10-CM | POA: Diagnosis not present

## 2014-10-06 DIAGNOSIS — F419 Anxiety disorder, unspecified: Secondary | ICD-10-CM

## 2014-10-06 DIAGNOSIS — F418 Other specified anxiety disorders: Secondary | ICD-10-CM | POA: Diagnosis not present

## 2014-10-07 LAB — HEMOGLOBIN A1C
Est. average glucose Bld gHb Est-mCnc: 128 mg/dL
Hgb A1c MFr Bld: 6.1 % — ABNORMAL HIGH (ref 4.8–5.6)

## 2014-10-07 LAB — CBC WITH DIFFERENTIAL
Basophils Absolute: 0 10*3/uL (ref 0.0–0.2)
Basos: 1 %
Eos: 1 %
Eosinophils Absolute: 0.1 10*3/uL (ref 0.0–0.4)
HCT: 35.2 % (ref 34.0–46.6)
Hemoglobin: 12 g/dL (ref 11.1–15.9)
Immature Grans (Abs): 0 10*3/uL (ref 0.0–0.1)
Immature Granulocytes: 0 %
Lymphocytes Absolute: 2.2 10*3/uL (ref 0.7–3.1)
Lymphs: 34 %
MCH: 31.3 pg (ref 26.6–33.0)
MCHC: 34.1 g/dL (ref 31.5–35.7)
MCV: 92 fL (ref 79–97)
Monocytes Absolute: 0.5 10*3/uL (ref 0.1–0.9)
Monocytes: 7 %
Neutrophils Absolute: 3.8 10*3/uL (ref 1.4–7.0)
Neutrophils Relative %: 57 %
Platelets: 339 10*3/uL (ref 150–379)
RBC: 3.84 x10E6/uL (ref 3.77–5.28)
RDW: 15.2 % (ref 12.3–15.4)
WBC: 6.6 10*3/uL (ref 3.4–10.8)

## 2014-10-07 LAB — COMPREHENSIVE METABOLIC PANEL
ALT: 21 IU/L (ref 0–32)
AST: 22 IU/L (ref 0–40)
Albumin/Globulin Ratio: 1.3 (ref 1.1–2.5)
Albumin: 4.1 g/dL (ref 3.5–4.8)
Alkaline Phosphatase: 63 IU/L (ref 39–117)
BUN/Creatinine Ratio: 17 (ref 11–26)
BUN: 13 mg/dL (ref 8–27)
CO2: 25 mmol/L (ref 18–29)
Calcium: 9.2 mg/dL (ref 8.7–10.3)
Chloride: 101 mmol/L (ref 97–108)
Creatinine, Ser: 0.75 mg/dL (ref 0.57–1.00)
GFR calc Af Amer: 93 mL/min/{1.73_m2} (ref >59)
GFR calc non Af Amer: 80 mL/min/{1.73_m2} (ref >59)
Globulin, Total: 3.1 g/dL (ref 1.5–4.5)
Glucose: 144 mg/dL — ABNORMAL HIGH (ref 65–99)
Potassium: 4 mmol/L (ref 3.5–5.2)
Sodium: 141 mmol/L (ref 134–144)
Total Bilirubin: 0.5 mg/dL (ref 0.0–1.2)
Total Protein: 7.2 g/dL (ref 6.0–8.5)

## 2014-10-07 LAB — LIPID PANEL
Chol/HDL Ratio: 4.7 ratio units — ABNORMAL HIGH (ref 0.0–4.4)
Cholesterol, Total: 217 mg/dL — ABNORMAL HIGH (ref 100–199)
HDL: 46 mg/dL (ref >39)
LDL Calculated: 145 mg/dL — ABNORMAL HIGH (ref 0–99)
Triglycerides: 129 mg/dL (ref 0–149)
VLDL Cholesterol Cal: 26 mg/dL (ref 5–40)

## 2014-10-07 LAB — TSH: TSH: 15.52 u[IU]/mL — ABNORMAL HIGH (ref 0.450–4.500)

## 2014-10-08 ENCOUNTER — Telehealth: Payer: Self-pay | Admitting: *Deleted

## 2014-10-08 NOTE — Telephone Encounter (Signed)
-----   Message from Gayland Curry, DO sent at 10/07/2014  1:35 PM EST ----- TSH is very high.  Has she been taking her thyroid medication?  If she has been, we need to increase the dose to 119mcg.  LDL is elevated and has been trending up.  Sugar average has also gone back up.  We will discuss these at her appt.

## 2014-10-08 NOTE — Telephone Encounter (Signed)
Spoke with the patient regarding her lab results, she states that she has not been taking her synthroid before eating breakfast. She states that she has been taking it after breakfast. I informed her that I  would be sending a new prescription to the pharmacy for a higher dose.  I also informed her that her LDL was elevated and her sugar had gone back up and she stated that do to the holidays she has not been eating right or going to the gym, but plans to get back on track now.

## 2014-10-12 ENCOUNTER — Other Ambulatory Visit: Payer: Self-pay | Admitting: Internal Medicine

## 2014-10-13 ENCOUNTER — Ambulatory Visit: Payer: Medicare Other | Admitting: Internal Medicine

## 2014-10-13 ENCOUNTER — Other Ambulatory Visit: Payer: Self-pay | Admitting: Internal Medicine

## 2014-10-18 ENCOUNTER — Other Ambulatory Visit: Payer: Self-pay | Admitting: Internal Medicine

## 2014-10-22 ENCOUNTER — Other Ambulatory Visit: Payer: Self-pay | Admitting: Internal Medicine

## 2014-11-05 ENCOUNTER — Other Ambulatory Visit: Payer: Self-pay | Admitting: Nurse Practitioner

## 2014-11-13 ENCOUNTER — Ambulatory Visit: Payer: Medicare Other | Admitting: Internal Medicine

## 2014-11-14 ENCOUNTER — Other Ambulatory Visit: Payer: Self-pay | Admitting: *Deleted

## 2014-11-14 MED ORDER — ALPRAZOLAM 1 MG PO TABS: ORAL_TABLET | ORAL | Status: DC

## 2014-11-14 NOTE — Telephone Encounter (Signed)
Patient requested to be called into pharmacy. She has lost her bottle and needs refilled. Refilled 8 days early.

## 2014-12-01 DIAGNOSIS — M545 Low back pain: Secondary | ICD-10-CM | POA: Diagnosis not present

## 2014-12-04 ENCOUNTER — Other Ambulatory Visit: Payer: Self-pay | Admitting: Internal Medicine

## 2014-12-05 ENCOUNTER — Other Ambulatory Visit: Payer: Self-pay | Admitting: Internal Medicine

## 2014-12-12 ENCOUNTER — Other Ambulatory Visit: Payer: Self-pay | Admitting: Internal Medicine

## 2015-01-05 ENCOUNTER — Encounter: Payer: Self-pay | Admitting: Internal Medicine

## 2015-01-05 ENCOUNTER — Ambulatory Visit (INDEPENDENT_AMBULATORY_CARE_PROVIDER_SITE_OTHER): Payer: Medicare Other | Admitting: Internal Medicine

## 2015-01-05 VITALS — BP 98/62 | HR 76 | Temp 97.6°F | Resp 20 | Ht 59.0 in | Wt 145.8 lb

## 2015-01-05 DIAGNOSIS — F32A Depression, unspecified: Secondary | ICD-10-CM

## 2015-01-05 DIAGNOSIS — Z23 Encounter for immunization: Secondary | ICD-10-CM | POA: Diagnosis not present

## 2015-01-05 DIAGNOSIS — I10 Essential (primary) hypertension: Secondary | ICD-10-CM

## 2015-01-05 DIAGNOSIS — F419 Anxiety disorder, unspecified: Secondary | ICD-10-CM

## 2015-01-05 DIAGNOSIS — R7303 Prediabetes: Secondary | ICD-10-CM

## 2015-01-05 DIAGNOSIS — F418 Other specified anxiety disorders: Secondary | ICD-10-CM

## 2015-01-05 DIAGNOSIS — E039 Hypothyroidism, unspecified: Secondary | ICD-10-CM

## 2015-01-05 DIAGNOSIS — R7309 Other abnormal glucose: Secondary | ICD-10-CM

## 2015-01-05 DIAGNOSIS — E785 Hyperlipidemia, unspecified: Secondary | ICD-10-CM

## 2015-01-05 DIAGNOSIS — F329 Major depressive disorder, single episode, unspecified: Secondary | ICD-10-CM

## 2015-01-05 DIAGNOSIS — G4700 Insomnia, unspecified: Secondary | ICD-10-CM | POA: Diagnosis not present

## 2015-01-05 NOTE — Progress Notes (Signed)
Patient ID: Kelli [name], female   DOB: 03/07/1946, 69 y.o.   MRN: [id number]   Location:  [facility]  Advanced Directive information Does patient have an advance directive?: No, Would patient like information on creating an advanced directive?: Yes - Educational materials given   Allergies  Allergen Reactions  . Latex Swelling    Chief Complaint  Patient presents with  . Medical Management of Chronic Issues    5 month follow-up, discuss medicatiion  . Immunizations    Prevnar 13    HPI: Patient is a 69 y.o. white female seen in the office today for med mgt chronic diseases.    Has only been to gym 2x per month all year.  She and her daughter are getting along better now.  She helped take care of her daughter who had surgery.  Doesn't sleep good every night, but more often that not.  Now understands she needs to be sleepy and the medicine will just calm her some more to help with the sleep.  Her daughter is encouraging her.  In January, she had a period where she was not taking care of herself.  Has now been taking thyroid faithfully.    Is going to work on her diet also.  Had a few readings of 126 range which was high for her.  Went to gym and next day better.  Self esteem down.    She feels like her short term memory is going--having some trouble finding words.  Says housework lagging.  Has read about AD and worries she has it.  Still pays bills.  Has too much stuff.   Does not regularly take zanaflex.  Only used 2x.    Had sharp pain after going to the gym--could not get up out of chair afterwards.  She saw Dr. Rhona Raider who said she has some lumbar DDD.  She is going to go to the gym today and be more careful.  Uses stairstepper.    Review of Systems:  Review of Systems  Constitutional: Negative for fever.  HENT: Negative for hearing loss.   Eyes: Negative for blurred vision.  Respiratory: Negative for shortness of breath.     Cardiovascular: Negative for chest pain and leg swelling.  Gastrointestinal: Negative for abdominal pain.  Genitourinary: Negative for dysuria.  Musculoskeletal: Positive for myalgias and back pain. Negative for falls.  Psychiatric/Behavioral: Positive for depression. The patient has insomnia.     Past Medical History  Diagnosis Date  . Diverticulosis   . Sigmoid polyp   . Colon polyp     Tubular Adenoma  . Colon polyp, hyperplastic   . Internal hemorrhoid   . Thyroid disease   . Hyperlipidemia   . Anxiety   . Hypertension   . Hypertonicity of bladder   . Insomnia, unspecified   . Flushing   . Other abnormal blood chemistry     Past Surgical History  Procedure Laterality Date  . Ectopic pregnancy surgery  1965  . Breast enhancement surgery  1969  . Cholecystectomy    . Rotator cuff repair  2013    Social History:   reports that she has quit smoking. She does not have any smokeless tobacco history on file. She reports that she does not drink alcohol or use illicit drugs.  Family History  Problem Relation Age of Onset  . Cancer Mother     lung  . Stroke Father     Medications: Patient's  Medications  New Prescriptions   No medications on file  Previous Medications   ALPRAZOLAM (XANAX) 1 MG TABLET    Take two tablets by mouth at bedtime as needed for anxiety   CITALOPRAM (CELEXA) 40 MG TABLET    TAKE ONE TABLET BY MOUTH ONCE DAILY FOR  DEPRESSION   ESTRADIOL (ESTRACE) 1 MG TABLET    Take 1 mg by mouth daily.   LEVOTHYROXINE (SYNTHROID, LEVOTHROID) 88 MCG TABLET    Take 88 mcg by mouth daily before breakfast.   MEDROXYPROGESTERONE (PROVERA) 5 MG TABLET    Take 5 mg by mouth daily.   MELOXICAM (MOBIC) 15 MG TABLET    Take 15 mg by mouth daily.   POLYETHYLENE GLYCOL (MIRALAX / GLYCOLAX) PACKET    Take 17 g by mouth daily as needed for mild constipation.   SIMVASTATIN (ZOCOR) 40 MG TABLET    TAKE ONE TABLET BY MOUTH ONCE DAILY   TELMISARTAN (MICARDIS) 40 MG TABLET     Take 40 mg by mouth daily.   TIZANIDINE (ZANAFLEX) 2 MG CAPSULE       ZOLPIDEM (AMBIEN) 5 MG TABLET    TAKE ONE TABLET BY MOUTH AT BEDTIME AS NEEDED FOR SLEEP  Modified Medications   No medications on file  Discontinued Medications   TELMISARTAN (MICARDIS) 40 MG TABLET    TAKE ONE-HALF TABLET BY MOUTH ONCE DAILY   TELMISARTAN (MICARDIS) 40 MG TABLET    TAKE ONE-HALF TABLET BY MOUTH ONCE DAILY     Physical Exam: Filed Vitals:   01/05/15 1323  BP: 98/62  Pulse: 76  Temp: 97.6 F (36.4 C)  TempSrc: Oral  Resp: 20  Height: 4\' 11"  (1.499 m)  Weight: 145 lb 12.8 oz (66.134 kg)  SpO2: 97%  Physical Exam  Constitutional: She is oriented to person, place, and time. She appears well-developed and well-nourished.  Cardiovascular: Normal rate, regular rhythm, normal heart sounds and intact distal pulses.   Pulmonary/Chest: Effort normal and breath sounds normal. No respiratory distress.  Abdominal: Soft. Bowel sounds are normal.  Musculoskeletal: Normal range of motion.  Neurological: She is alert and oriented to person, place, and time.  Skin: Skin is warm and dry. There is pallor.  Psychiatric:  anxious    Labs reviewed: Basic Metabolic Panel:  Recent Labs  05/15/14 1056 05/15/14 1107 10/06/14 1640  NA 138 138 141  K 3.9 3.8 4.0  CL 101 103 101  CO2 24  --  25  GLUCOSE 108* 112* 144*  BUN 14 13 13   CREATININE 0.78 0.80 0.75  CALCIUM 8.6  --  9.2  TSH  --   --  15.520*   Liver Function Tests:  Recent Labs  05/15/14 1056 10/06/14 1640  AST 19 22  ALT 12 21  ALKPHOS 63 63  BILITOT 0.5 0.5  PROT 7.5 7.2  ALBUMIN 3.4*  --   CBC:  Recent Labs  05/15/14 1056 05/15/14 1107 10/06/14 1640  WBC 8.0  --  6.6  NEUTROABS 5.4  --  3.8  HGB 11.1* 12.2 12.0  HCT 32.5* 36.0 35.2  MCV 91.8  --  92  PLT 199  --  339   Lipid Panel:  Recent Labs  05/16/14 0350 10/06/14 1640  CHOL 218* 217*  HDL 57 46  LDLCALC 140* 145*  TRIG 104 129  CHOLHDL 3.8 4.7*   Lab  Results  Component Value Date   HGBA1C 6.1* 10/06/2014    Assessment/Plan 1. Prediabetes -sugar average was elevated  last time -recheck today -to go back to the gym to get her weight back down and make dietary changes  2. Insomnia -continues with ambien at bedtime and takes a xanax an hour before -discussed limiting xanax due her age and effects on her memory  3. Hypothyroidism, unspecified hypothyroidism type -f/u TSH--was very high last time -cont synthroid  4. Essential hypertension, benign -bp at goal, cont current therapy  5. Anxiety and depression -cont celexa and xanax--I have tried to get her off xanax so far to no avail  6. Hyperlipidemia -cont zocor -to get back on diet and exercising at least 3x per week  7.  Need for 13 valent pneumonia vaccine -prevnar given  Labs/tests ordered:   Orders Placed This Encounter  Procedures  . TSH  . Hemoglobin A1c    Next appt:  3 mos with labs before  Dale Strausser L. Joselynne Killam, D.O. [facility] [address], [place] [address] Cell Phone (Mon-Fri 8am-5pm):  [phone] On Call:  [phone] & follow prompts after 5pm & weekends Office Phone:  [phone] Office Fax:  [phone]

## 2015-01-06 LAB — HEMOGLOBIN A1C
Est. average glucose Bld gHb Est-mCnc: 128 mg/dL
Hgb A1c MFr Bld: 6.1 % — ABNORMAL HIGH (ref 4.8–5.6)

## 2015-01-06 LAB — TSH: TSH: 0.817 u[IU]/mL (ref 0.450–4.500)

## 2015-01-07 ENCOUNTER — Other Ambulatory Visit: Payer: Self-pay | Admitting: Internal Medicine

## 2015-01-21 ENCOUNTER — Other Ambulatory Visit: Payer: Self-pay | Admitting: Internal Medicine

## 2015-02-05 ENCOUNTER — Other Ambulatory Visit: Payer: Self-pay | Admitting: Internal Medicine

## 2015-02-06 ENCOUNTER — Other Ambulatory Visit: Payer: Self-pay | Admitting: Internal Medicine

## 2015-03-24 ENCOUNTER — Other Ambulatory Visit: Payer: Self-pay | Admitting: Internal Medicine

## 2015-04-07 ENCOUNTER — Other Ambulatory Visit: Payer: Medicare Other

## 2015-04-07 DIAGNOSIS — E785 Hyperlipidemia, unspecified: Secondary | ICD-10-CM

## 2015-04-07 DIAGNOSIS — E039 Hypothyroidism, unspecified: Secondary | ICD-10-CM

## 2015-04-07 DIAGNOSIS — R7309 Other abnormal glucose: Secondary | ICD-10-CM | POA: Diagnosis not present

## 2015-04-07 DIAGNOSIS — R7303 Prediabetes: Secondary | ICD-10-CM

## 2015-04-07 DIAGNOSIS — I10 Essential (primary) hypertension: Secondary | ICD-10-CM | POA: Diagnosis not present

## 2015-04-08 LAB — COMPREHENSIVE METABOLIC PANEL
ALT: 15 IU/L (ref 0–32)
AST: 19 IU/L (ref 0–40)
Albumin/Globulin Ratio: 1.3 (ref 1.1–2.5)
Albumin: 4.2 g/dL (ref 3.5–4.8)
Alkaline Phosphatase: 55 IU/L (ref 39–117)
BUN/Creatinine Ratio: 20 (ref 11–26)
BUN: 14 mg/dL (ref 8–27)
Bilirubin Total: 0.3 mg/dL (ref 0.0–1.2)
CO2: 24 mmol/L (ref 18–29)
Calcium: 9 mg/dL (ref 8.7–10.3)
Chloride: 103 mmol/L (ref 97–108)
Creatinine, Ser: 0.71 mg/dL (ref 0.57–1.00)
GFR calc Af Amer: 99 mL/min/{1.73_m2} (ref >59)
GFR calc non Af Amer: 86 mL/min/{1.73_m2} (ref >59)
Globulin, Total: 3.3 g/dL (ref 1.5–4.5)
Glucose: 128 mg/dL — ABNORMAL HIGH (ref 65–99)
Potassium: 4.1 mmol/L (ref 3.5–5.2)
Sodium: 140 mmol/L (ref 134–144)
Total Protein: 7.5 g/dL (ref 6.0–8.5)

## 2015-04-08 LAB — CBC WITH DIFFERENTIAL/PLATELET
Basophils Absolute: 0 10*3/uL (ref 0.0–0.2)
Basos: 0 %
EOS (ABSOLUTE): 0 10*3/uL (ref 0.0–0.4)
Eos: 1 %
Hematocrit: 36.9 % (ref 34.0–46.6)
Hemoglobin: 12.2 g/dL (ref 11.1–15.9)
Immature Grans (Abs): 0 10*3/uL (ref 0.0–0.1)
Immature Granulocytes: 0 %
Lymphocytes Absolute: 2.2 10*3/uL (ref 0.7–3.1)
Lymphs: 33 %
MCH: 30.6 pg (ref 26.6–33.0)
MCHC: 33.1 g/dL (ref 31.5–35.7)
MCV: 93 fL (ref 79–97)
Monocytes Absolute: 0.7 10*3/uL (ref 0.1–0.9)
Monocytes: 10 %
Neutrophils Absolute: 3.7 10*3/uL (ref 1.4–7.0)
Neutrophils: 56 %
Platelets: 312 10*3/uL (ref 150–379)
RBC: 3.99 x10E6/uL (ref 3.77–5.28)
RDW: 14.6 % (ref 12.3–15.4)
WBC: 6.6 10*3/uL (ref 3.4–10.8)

## 2015-04-08 LAB — TSH: TSH: 0.919 u[IU]/mL (ref 0.450–4.500)

## 2015-04-08 LAB — LIPID PANEL
Chol/HDL Ratio: 3.9 ratio units (ref 0.0–4.4)
Cholesterol, Total: 189 mg/dL (ref 100–199)
HDL: 48 mg/dL (ref >39)
LDL Calculated: 108 mg/dL — ABNORMAL HIGH (ref 0–99)
Triglycerides: 165 mg/dL — ABNORMAL HIGH (ref 0–149)
VLDL Cholesterol Cal: 33 mg/dL (ref 5–40)

## 2015-04-08 LAB — HEMOGLOBIN A1C
Est. average glucose Bld gHb Est-mCnc: 126 mg/dL
Hgb A1c MFr Bld: 6 % — ABNORMAL HIGH (ref 4.8–5.6)

## 2015-04-09 ENCOUNTER — Ambulatory Visit (INDEPENDENT_AMBULATORY_CARE_PROVIDER_SITE_OTHER): Payer: Medicare Other | Admitting: Internal Medicine

## 2015-04-09 ENCOUNTER — Encounter: Payer: Self-pay | Admitting: Internal Medicine

## 2015-04-09 VITALS — BP 116/76 | HR 84 | Temp 97.8°F | Resp 20 | Ht 59.0 in | Wt 149.6 lb

## 2015-04-09 DIAGNOSIS — R7309 Other abnormal glucose: Secondary | ICD-10-CM

## 2015-04-09 DIAGNOSIS — I10 Essential (primary) hypertension: Secondary | ICD-10-CM | POA: Diagnosis not present

## 2015-04-09 DIAGNOSIS — R7303 Prediabetes: Secondary | ICD-10-CM

## 2015-04-09 DIAGNOSIS — E039 Hypothyroidism, unspecified: Secondary | ICD-10-CM

## 2015-04-09 DIAGNOSIS — F329 Major depressive disorder, single episode, unspecified: Secondary | ICD-10-CM | POA: Diagnosis not present

## 2015-04-09 DIAGNOSIS — G4700 Insomnia, unspecified: Secondary | ICD-10-CM | POA: Diagnosis not present

## 2015-04-09 DIAGNOSIS — E785 Hyperlipidemia, unspecified: Secondary | ICD-10-CM

## 2015-04-09 DIAGNOSIS — R1310 Dysphagia, unspecified: Secondary | ICD-10-CM

## 2015-04-09 DIAGNOSIS — F32A Depression, unspecified: Secondary | ICD-10-CM

## 2015-04-09 MED ORDER — CITALOPRAM HYDROBROMIDE 40 MG PO TABS: 20.0000 mg | ORAL_TABLET | Freq: Every day | ORAL | Status: DC

## 2015-04-09 MED ORDER — BUPROPION HCL ER (XL) 150 MG PO TB24: 150.0000 mg | ORAL_TABLET | Freq: Every day | ORAL | Status: DC

## 2015-04-09 NOTE — Progress Notes (Signed)
Patient ID: Kelli [name], female   DOB: November 25, 1945, 69 y.o.   MRN: [id number]   Location:  [facility]  Code Status: full code Goals of Care: Advanced Directive information Does patient have an advance directive?: No, Would patient like information on creating an advanced directive?: Yes - Educational materials given (previously)   Chief Complaint  Patient presents with  . Medical Management of Chronic Issues    3 month follow-up    HPI: Patient is a 69 y.o. white female seen in the office today for med mgt of chronic diseases.    Has had to pay $42 for her zolpidem the last two months.  [employer] said they would "probably cover it if we called and said she needed it".  Said she left messages requesting this.  She did a handwritten note for me.  I read this and gave it to the triage desk.    She has gained three more lbs since last visit.  She didn't actually start going to the gym as she planned at first, but her daughter has been encouraging her and she's sworn off desserts for the past two weeks.  Says she can't resist them in the house.  Has difficulty adjusting the machines so she wrote down numbers for machines and lbs so she can set them properly.  Started to go in the past 2 wks.  Going M/W/F.    Has one new issue:  When takes her zolpidem and xanax, she feels like medication sticks in her throat--takes with water--has tried extra but wouldn't work.  Not a problem in the morning.  Takes three cherries after finishing swallowing the pills.   No difficulty with solid foods or episodes of choking.  Feels like it's stuck on the right side.    When started antidepressants, her husband was still alive and she was taking care of him.  She felt like she couldn't get her breath, was crying all of the time and she lost her appetite. Went to Dr. Rockwell [place] and antidepressant started.  Feeling in her chest took about a month to resolve and appetite returned.   Three months ago, her memory was feeling much worse.  We upped her dosage of antidepressant at that time.  When she's out, she's fine, but hard to leave home.  Has to force herself to go.  Doesn't want to talk to anybody.  Sister was going to visit her on the way to [place].  Still lacks motivation to clean her house. Feels drowsy a lot.  She went to counseling many years ago and she was on zoloft then.  Wants to try something that gives her more pep.    Discussed lipid results:  Eats whole grain bread--may have had 4 slices in a day.  She is going to add cholesterol to her radar when she's looking at labels.  Review of Systems:  Review of Systems  Constitutional: Negative for fever, chills, weight loss and malaise/fatigue.  HENT: Negative for congestion and hearing loss.   Eyes: Negative for blurred vision.       Glasses  Respiratory: Negative for shortness of breath.   Cardiovascular: Negative for chest pain and leg swelling.  Gastrointestinal: Negative for abdominal pain, constipation, blood in stool and melena.  Genitourinary: Negative for dysuria, urgency and frequency.  Musculoskeletal: Negative for falls.  Skin:       scaly scalp  Neurological: Negative for dizziness, loss of consciousness and weakness.  Psychiatric/Behavioral: Positive for depression. The patient is nervous/anxious and has insomnia.     Past Medical History  Diagnosis Date  . Diverticulosis   . Sigmoid polyp   . Colon polyp     Tubular Adenoma  . Colon polyp, hyperplastic   . Internal hemorrhoid   . Thyroid disease   . Hyperlipidemia   . Anxiety   . Hypertension   . Hypertonicity of bladder   . Insomnia, unspecified   . Flushing   . Other abnormal blood chemistry     Past Surgical History  Procedure Laterality Date  . Ectopic pregnancy surgery  1965  . Breast enhancement surgery  1969  . Cholecystectomy    . Rotator cuff repair  2013    Allergies  Allergen Reactions  . Latex Swelling    Medications: Patient's Medications  New Prescriptions   No medications on file  Previous Medications   ALPRAZOLAM (XANAX) 1 MG TABLET    TAKE TWO TABLETS BY MOUTH AT BEDTIME AS NEEDED FOR ANXIETY   CITALOPRAM (CELEXA) 40 MG TABLET    TAKE ONE TABLET BY MOUTH ONCE DAILY FOR  DEPRESSION   ESTRADIOL (ESTRACE) 1 MG TABLET    Take 1 mg by mouth daily.   LEVOTHYROXINE (SYNTHROID, LEVOTHROID) 88 MCG TABLET    TAKE ONE TABLET BY MOUTH ONCE DAILY   MEDROXYPROGESTERONE (PROVERA) 5 MG TABLET    Take 5 mg by mouth daily.   POLYETHYLENE GLYCOL (MIRALAX / GLYCOLAX) PACKET    Take 17 g by mouth daily as needed for mild constipation.   SIMVASTATIN (ZOCOR) 40 MG TABLET    TAKE ONE TABLET BY MOUTH ONCE DAILY   TELMISARTAN (MICARDIS) 40 MG TABLET    TAKE ONE-HALF TABLET BY MOUTH ONCE DAILY   TIZANIDINE (ZANAFLEX) 2 MG CAPSULE       ZOLPIDEM (AMBIEN) 5 MG TABLET    TAKE ONE TABLET BY MOUTH AT BEDTIME AS NEEDED FOR SLEEP  Modified Medications   No medications on file  Discontinued Medications   LEVOTHYROXINE (SYNTHROID, LEVOTHROID) 88 MCG TABLET    Take 88 mcg by mouth daily before breakfast.   TELMISARTAN (MICARDIS) 40 MG TABLET    Take 40 mg by mouth daily.    Physical Exam: Filed Vitals:   04/09/15 1531  BP: 116/76  Pulse: 84  Temp: 97.8 F (36.6 C)  TempSrc: Oral  Resp: 20  Height: 4\' 11"  (1.499 m)  Weight: 149 lb 9.6 oz (67.858 kg)  SpO2: 97%   Physical Exam  Constitutional: She is oriented to person, place, and time. She appears well-developed and well-nourished. No distress.  Cardiovascular: Normal rate, regular rhythm, normal heart sounds and intact distal pulses.   Pulmonary/Chest: Effort normal and breath sounds normal. No respiratory distress.  Abdominal: Soft. Bowel sounds are normal.  Musculoskeletal: Normal range of motion.  Neurological: She is alert and oriented to person, place, and time.  Skin: Skin is warm and dry.  Scaly skin on bilateral temple areas; alopecia on top of  head  Psychiatric:  anxious    Labs reviewed: Basic Metabolic Panel:  Recent Labs  05/15/14 1056 05/15/14 1107 10/06/14 1640 01/05/15 1434 04/07/15 1620  NA 138 138 141  --  140  K 3.9 3.8 4.0  --  4.1  CL 101 103 101  --  103  CO2 24  --  25  --  24  GLUCOSE 108* 112* 144*  --  128*  BUN 14 13 13   --  14  CREATININE 0.78 0.80 0.75  --  0.71  CALCIUM 8.6  --  9.2  --  9.0  TSH  --   --  15.520* 0.817 0.919   Liver Function Tests:  Recent Labs  05/15/14 1056 10/06/14 1640 04/07/15 1620  AST 19 22 19   ALT 12 21 15   ALKPHOS 63 63 55  BILITOT 0.5 0.5 0.3  PROT 7.5 7.2 7.5  ALBUMIN 3.4*  --   --    No results for input(s): LIPASE, AMYLASE in the last 8760 hours. No results for input(s): AMMONIA in the last 8760 hours. CBC:  Recent Labs  05/15/14 1056 05/15/14 1107 10/06/14 1640 04/07/15 1620  WBC 8.0  --  6.6 6.6  NEUTROABS 5.4  --  3.8 3.7  HGB 11.1* 12.2 12.0  --   HCT 32.5* 36.0 35.2 36.9  MCV 91.8  --  92  --   PLT 199  --  339  --    Lipid Panel:  Recent Labs  05/16/14 0350 10/06/14 1640 04/07/15 1620  CHOL 218* 217* 189  HDL 57 46 48  LDLCALC 140* 145* 108*  TRIG 104 129 165*  CHOLHDL 3.8 4.7* 3.9   Lab Results  Component Value Date   HGBA1C 6.0* 04/07/2015      Assessment/Plan 1. Prediabetes - cont going to gym at least three times a week and cutting back on sweets and starchy foods, drink more water - CBC with Differential/Platelet; Future - Hemoglobin A1c; Future - Lipid panel; Future -cont statin  2. Insomnia -stable with current meds--cont her xanax and Kelli [name] but has been advised not to take them together  3. Depression -worse--will try her on wellbutrin b/c it should not cause weight gain and might give her some motivation - buPROPion (WELLBUTRIN XL) 150 MG 24 hr tablet; Take 1 tablet (150 mg total) by mouth daily.  Dispense: 30 tablet; Refill: 3  4. Hypothyroidism, unspecified hypothyroidism type -cont current tsh,  chemically euthyroid  5. Essential hypertension, benign -bp at goal with micardis--cont this - CBC with Differential/Platelet; Future  6. Hyperlipidemia -LDL at goal, but TG still high -cont zocor 40mg  and f/u labs  -work on diet some more and cont exercise routine - Lipid panel; Future  7.  Pill dysphagia:  New problem and only with evening pills -cause unclear -if worsens, would order barium swallow and ST  Labs/tests ordered:   Orders Placed This Encounter  Procedures  . CBC with Differential/Platelet    Standing Status: Future     Number of Occurrences:      Standing Expiration Date: 10/10/2015  . Hemoglobin A1c    Standing Status: Future     Number of Occurrences:      Standing Expiration Date: 10/10/2015  . Lipid panel    Standing Status: Future     Number of Occurrences:      Standing Expiration Date: 10/10/2015    Order Specific Question:  Has the patient fasted?    Answer:  Yes    Next appt:  6 wks re: depression  Adalie Mand L. Junell Cullifer, D.O. [facility] [address], [place] [address] Cell Phone (Mon-Fri 8am-5pm):  [phone] On Call:  [phone] & follow prompts after 5pm & weekends Office Phone:  [phone] Office Fax:  [phone]

## 2015-04-09 NOTE — Patient Instructions (Addendum)
Decrease citalopram from 40mg  to 20mg  beginning tomorrow and take this 1/2 pill for a week. Also begin wellbutrin xl 150mg  daily tomorrow.

## 2015-04-12 ENCOUNTER — Other Ambulatory Visit: Payer: Self-pay | Admitting: Internal Medicine

## 2015-04-14 ENCOUNTER — Telehealth: Payer: Self-pay | Admitting: *Deleted

## 2015-04-14 ENCOUNTER — Other Ambulatory Visit: Payer: Self-pay | Admitting: Internal Medicine

## 2015-04-14 NOTE — Telephone Encounter (Signed)
Patient called and wanted me to call her insurance company regarding a prior authorization for her Zolpidem. Cost $42.00 vs $16.00. Called [employer] #:[phone] and spoke with Sharyn Lull. Authorization went into clinical determination and will know a response in 24-72 hours. Patient Notified. Member ID#: [id number]

## 2015-04-15 NOTE — Telephone Encounter (Signed)
Received fax from [facility] [phone] and Zolpidem was APPROVED through 04/13/2016.  Patient Notified

## 2015-04-23 ENCOUNTER — Telehealth: Payer: Self-pay

## 2015-04-23 ENCOUNTER — Other Ambulatory Visit: Payer: Self-pay | Admitting: Nurse Practitioner

## 2015-04-23 ENCOUNTER — Telehealth: Payer: Self-pay | Admitting: Internal Medicine

## 2015-04-23 NOTE — Telephone Encounter (Signed)
Message was received via fax stating patient need to schedule for a full maxillary extraction under IV sedation. The patient is currently taking antidepressant and high cholesterol medication. The surgery location would like to know if there is any precautions with sedation or any contraindications.  Fax was placed on ledge for Dr.Reed to further review and advise. Response will be manually written and faxed back by medical records   Phone number for Dr.Allen's office is [phone]

## 2015-04-23 NOTE — Telephone Encounter (Signed)
Reviewed her chart.  Recommended only short term narcotic use after teeth extraction due to her dependence on ambien and benzos and history of suspected overdose.  Form completed and placed for copying, scanning and faxing to dental office.

## 2015-04-24 NOTE — Telephone Encounter (Signed)
Completed form for patient's tooth extraction via IV sedation.  Mentioned need to be careful b/c of her ongoing use of benzos and ambien.  Recommended they do not prescribe much in the way of narcotics due to risk of respiratory arrest from oversedation.

## 2015-05-06 ENCOUNTER — Other Ambulatory Visit: Payer: Self-pay | Admitting: *Deleted

## 2015-05-06 DIAGNOSIS — R7303 Prediabetes: Secondary | ICD-10-CM

## 2015-05-06 MED ORDER — GLUCOSE BLOOD VI STRP: ORAL_STRIP | Status: DC

## 2015-05-07 ENCOUNTER — Other Ambulatory Visit: Payer: Self-pay | Admitting: *Deleted

## 2015-05-07 DIAGNOSIS — R7303 Prediabetes: Secondary | ICD-10-CM

## 2015-05-07 MED ORDER — GLUCOSE BLOOD VI STRP: ORAL_STRIP | Status: DC

## 2015-05-07 NOTE — Telephone Encounter (Signed)
[facility] 

## 2015-05-15 ENCOUNTER — Other Ambulatory Visit: Payer: Self-pay | Admitting: Internal Medicine

## 2015-05-16 ENCOUNTER — Other Ambulatory Visit: Payer: Self-pay | Admitting: Internal Medicine

## 2015-05-24 ENCOUNTER — Other Ambulatory Visit: Payer: Self-pay | Admitting: Internal Medicine

## 2015-05-25 ENCOUNTER — Encounter: Payer: Self-pay | Admitting: Internal Medicine

## 2015-05-25 ENCOUNTER — Ambulatory Visit (INDEPENDENT_AMBULATORY_CARE_PROVIDER_SITE_OTHER): Payer: Medicare Other | Admitting: Internal Medicine

## 2015-05-25 VITALS — BP 128/70 | HR 97 | Temp 98.1°F | Resp 18 | Ht 59.0 in | Wt 149.2 lb

## 2015-05-25 DIAGNOSIS — E785 Hyperlipidemia, unspecified: Secondary | ICD-10-CM

## 2015-05-25 DIAGNOSIS — R7309 Other abnormal glucose: Secondary | ICD-10-CM

## 2015-05-25 DIAGNOSIS — E039 Hypothyroidism, unspecified: Secondary | ICD-10-CM | POA: Diagnosis not present

## 2015-05-25 DIAGNOSIS — F418 Other specified anxiety disorders: Secondary | ICD-10-CM

## 2015-05-25 DIAGNOSIS — F329 Major depressive disorder, single episode, unspecified: Secondary | ICD-10-CM

## 2015-05-25 DIAGNOSIS — Z86010 Personal history of colonic polyps: Secondary | ICD-10-CM | POA: Diagnosis not present

## 2015-05-25 DIAGNOSIS — I10 Essential (primary) hypertension: Secondary | ICD-10-CM | POA: Diagnosis not present

## 2015-05-25 DIAGNOSIS — R7303 Prediabetes: Secondary | ICD-10-CM

## 2015-05-25 DIAGNOSIS — F32A Depression, unspecified: Secondary | ICD-10-CM

## 2015-05-25 DIAGNOSIS — M8580 Other specified disorders of bone density and structure, unspecified site: Secondary | ICD-10-CM | POA: Diagnosis not present

## 2015-05-25 DIAGNOSIS — F419 Anxiety disorder, unspecified: Secondary | ICD-10-CM

## 2015-05-25 MED ORDER — BUPROPION HCL ER (XL) 300 MG PO TB24: 300.0000 mg | ORAL_TABLET | Freq: Every day | ORAL | Status: DC

## 2015-05-25 MED ORDER — GLUCOSE BLOOD VI STRP: ORAL_STRIP | Status: DC

## 2015-05-25 MED ORDER — BUPROPION HCL ER (XL) 300 MG PO TB24
300.0000 mg | ORAL_TABLET | Freq: Every day | ORAL | Status: DC
Start: 2015-05-25 — End: 2015-05-25

## 2015-05-25 NOTE — Patient Instructions (Addendum)
Use ice on your back after exercise and at bedtime for about 20 minutes each time.  Good luck with your dental work!    Keep up the good work going to the gym and watching your diet.

## 2015-05-25 NOTE — Progress Notes (Signed)
Patient ID: Kelli [name], female   DOB: 1946-04-17, 69 y.o.   MRN: [id number]   Location:  [facility]  Goals of Care: Advanced Directive information Does patient have an advance directive?: Yes, Type of Advance Directive: [place];Living will, Does patient want to make changes to advanced directive?: No - Patient declined   Chief Complaint  Patient presents with  . Medical Management of Chronic Issues    HPI: Patient is a 69 y.o. white female seen in the office today for med mgt of chronic diseases.  Last visit she was started on wellbutrin to help her spirits/depression.  Had some problem getting test strips. Nails are doing better.  Had picked them down to the quick.  Nerves improved now.  Had diarrhea for a couple of weeks consistently.    Is getting her house together.  Has a filing system, she's developing now to do bills.    Was able to get up earlier and not feel sleepy.  Now this week, she's sleeping too late like 1230pm.  Goes to bed at 1230am or later (zolpidem 5mg  and 1/2 alprazolam, then a few hrs later took other 1/2 alprazolam).    Has been talking with sisters on the phone.  Is very stressed about her denture thing.  Has been reading online about it.  Is glad she only has to do the uppers.  Is to go for a mock-up on Wed.  Has had difficulty eating as a result of her tooth problem.  The past few weeks, has been eating frozen veggies.    Went two weeks three times.  Was off gym when it was 100 degrees outside, but went back last Monday, Thurs, Sat.       Has muscle relaxer for her back (zanaflex 2mg )--took three last week--prevent spasms.  Discussed this can cause her to be sleepy.          Refuses mammogram due to the risk of rupture of her implants.  Last was in 2011 and she had pain in the left breast afterwards.  She will accept an ultrasound in the future if insurance covers it.    She wants to wait on  her bone density at this time until her tooth problems are taken care of.    She also would like to put off her cscope for f/u of polyps until her teeth are taken care of.  She does not want to see Dr. Cristina Gong this time.  Requests to see someone else (requests Dr. Hilarie Fredrickson at [facility] instead).    Review of Systems:  Review of Systems  Constitutional: Positive for malaise/fatigue. Negative for fever and chills.  HENT: Negative for congestion and hearing loss.   Eyes: Negative for blurred vision.       Glasses  Respiratory: Negative for shortness of breath.   Cardiovascular: Negative for chest pain and leg swelling.  Gastrointestinal: Negative for abdominal pain, constipation, blood in stool and melena.  Genitourinary: Negative for dysuria.  Musculoskeletal: Positive for myalgias and back pain. Negative for falls.  Skin: Negative for itching and rash.       Dry scaly skin and scalp  Neurological: Negative for dizziness, loss of consciousness, weakness and headaches.  Psychiatric/Behavioral: Positive for depression. Negative for memory loss. The patient is nervous/anxious and has insomnia.        Improved mood    Past Medical History  Diagnosis Date  . Diverticulosis   .  Sigmoid polyp   . Colon polyp     Tubular Adenoma  . Colon polyp, hyperplastic   . Internal hemorrhoid   . Thyroid disease   . Hyperlipidemia   . Anxiety   . Hypertension   . Hypertonicity of bladder   . Insomnia, unspecified   . Flushing   . Other abnormal blood chemistry     Past Surgical History  Procedure Laterality Date  . Ectopic pregnancy surgery  1965  . Breast enhancement surgery  1969  . Cholecystectomy    . Rotator cuff repair  2013    Allergies  Allergen Reactions  . Latex Swelling   Medications: Patient's Medications  New Prescriptions   No medications on file  Previous Medications   ALPRAZOLAM (XANAX) 1 MG TABLET    TAKE TWO TABLETS BY MOUTH AT BEDTIME AS NEEDED FOR ANXIETY    BUPROPION (WELLBUTRIN XL) 150 MG 24 HR TABLET    Take 1 tablet (150 mg total) by mouth daily.   CITALOPRAM (CELEXA) 40 MG TABLET    Take 0.5 tablets (20 mg total) by mouth daily.   ESTRADIOL (ESTRACE) 1 MG TABLET    Take 1 mg by mouth daily.   GLUCOSE BLOOD (FREESTYLE LITE) TEST STRIP    Use as instructed to check blood sugar daily. Dx: R73.09   LEVOTHYROXINE (SYNTHROID, LEVOTHROID) 88 MCG TABLET    TAKE ONE TABLET BY MOUTH ONCE DAILY   MEDROXYPROGESTERONE (PROVERA) 5 MG TABLET    Take 5 mg by mouth daily.   POLYETHYLENE GLYCOL (MIRALAX / GLYCOLAX) PACKET    Take 17 g by mouth daily as needed for mild constipation.   POLYETHYLENE GLYCOL POWDER (GLYCOLAX/MIRALAX) POWDER    TAKE ONE CAPFUL BY MOUTH TWICE DAILY AS NEEDED   SIMVASTATIN (ZOCOR) 40 MG TABLET    TAKE ONE TABLET BY MOUTH ONCE DAILY   TELMISARTAN (MICARDIS) 40 MG TABLET    TAKE ONE-HALF TABLET BY MOUTH ONCE DAILY   TIZANIDINE (ZANAFLEX) 2 MG CAPSULE       ZOLPIDEM (AMBIEN) 5 MG TABLET    TAKE ONE TABLET BY MOUTH AT BEDTIME AS NEEDED FOR SLEEP  Modified Medications   No medications on file  Discontinued Medications   No medications on file    Physical Exam: Filed Vitals:   05/25/15 1508  BP: 128/70  Pulse: 97  Temp: 98.1 F (36.7 C)  TempSrc: Oral  Resp: 18  Height: 4\' 11"  (1.499 m)  Weight: 149 lb 3.2 oz (67.677 kg)  SpO2: 97%   Physical Exam  Constitutional: She is oriented to person, place, and time. She appears well-developed and well-nourished. No distress.  Cardiovascular: Normal rate, regular rhythm, normal heart sounds and intact distal pulses.   Pulmonary/Chest: Effort normal and breath sounds normal. No respiratory distress.  Abdominal: Soft. Bowel sounds are normal. She exhibits distension. She exhibits no mass. There is no tenderness. There is no rebound and no guarding.  Abdominal obesity  Musculoskeletal: Normal range of motion. She exhibits tenderness.  Paravertebral muscles around L4/5 region    Neurological: She is alert and oriented to person, place, and time.  Skin:  Scaly skin around temples bilaterally, thinning hair  Psychiatric:  Much calmer, not tearful or tangential today, still talkative    Labs reviewed: Basic Metabolic Panel:  Recent Labs  10/06/14 1640 01/05/15 1434 04/07/15 1620  NA 141  --  140  K 4.0  --  4.1  CL 101  --  103  CO2 25  --  24  GLUCOSE 144*  --  128*  BUN 13  --  14  CREATININE 0.75  --  0.71  CALCIUM 9.2  --  9.0  TSH 15.520* 0.817 0.919   Liver Function Tests:  Recent Labs  10/06/14 1640 04/07/15 1620  AST 22 19  ALT 21 15  ALKPHOS 63 55  BILITOT 0.5 0.3  PROT 7.2 7.5   No results for input(s): LIPASE, AMYLASE in the last 8760 hours. No results for input(s): AMMONIA in the last 8760 hours. CBC:  Recent Labs  10/06/14 1640 04/07/15 1620  WBC 6.6 6.6  NEUTROABS 3.8 3.7  HGB 12.0  --   HCT 35.2 36.9  MCV 92  --   PLT 339  --    Lipid Panel:  Recent Labs  10/06/14 1640 04/07/15 1620  CHOL 217* 189  HDL 46 48  LDLCALC 145* 108*  TRIG 129 165*  CHOLHDL 4.7* 3.9   Lab Results  Component Value Date   HGBA1C 6.0* 04/07/2015    Assessment/Plan 1. Prediabetes - continued to educate her on diet and exercise -needs to go to gym at least 3 times a week, preferably 5 for one hour and do cardio at least 30 mins each time - glucose blood (FREESTYLE LITE) test strip; Use as instructed to check blood sugar daily. Dx: R73.09  Dispense: 100 each; Refill: 12 - Hemoglobin A1c; Future  2. Essential hypertension, benign -bp at goal with current regimen, no changes - Hemoglobin A1c; Future - Basic metabolic panel; Future  3. Hyperlipidemia -lipids have worsened with inability to eat regular food (needs teeth pulled and implants), not exercising like she should - now on 40mg  simvastatin, may need to increase to 80 if she does not reach goal next time--does not have any s/s of myopathy or intolerance - Lipid panel;  Future - Basic metabolic panel; Future  4. Anxiety and depression -will increase her wellbutrin from 150 to 300 due to benefits, but still not quite back to her norm - buPROPion (WELLBUTRIN XL) 300 MG 24 hr tablet; Take 1 tablet (300 mg total) by mouth daily.  Dispense: 30 tablet; Refill: 3 -if still struggling, may need to add another agent, but would like to avoid polypharmacy issue in this lady  5. Hypothyroidism, unspecified hypothyroidism type -cont current synthroid--reviewed that she takes it properly, last tsh wnl  6. History of colonic polyps -wants to put off cscope for now, will need to f/u each visit until she's ready to go and now wants to go to Dr. Hilarie Fredrickson, not [facility] due to concerns about undersedation  7. Osteopenia -wants bone density delayed also until she gets her teeth taken care of -advised to take vitamin D 2000 units daily and cont/increase weightbearing exercise and ca containing foods  Labs/tests ordered:  Orders Placed This Encounter  Procedures  . Hemoglobin A1c    Standing Status: Future     Number of Occurrences:      Standing Expiration Date: 11/25/2015  . Lipid panel    Standing Status: Future     Number of Occurrences:      Standing Expiration Date: 11/25/2015    Order Specific Question:  Has the patient fasted?    Answer:  Yes  . Basic metabolic panel    Standing Status: Future     Number of Occurrences:      Standing Expiration Date: 11/25/2015    Order Specific Question:  Has the patient fasted?    Answer:  Yes  Next appt:  3 mos for med mgt  Doyce Saling L. Ryver Poblete, D.O. [facility] [address], [place] [address] Cell Phone (Mon-Fri 8am-5pm):  [phone] On Call:  [phone] & follow prompts after 5pm & weekends Office Phone:  [phone] Office Fax:  [phone]

## 2015-05-28 ENCOUNTER — Telehealth: Payer: Self-pay

## 2015-05-28 NOTE — Telephone Encounter (Signed)
Message received via manual fax from [facility] indicating patients Freestyle test strips do not have a supporting diagnosis code.  Dr.Reed was given this message and responded:  Patient does not have diabetes yet, but has all the risk factors and obesity. We are trying to check glucose early and keep a close track so she does not progress. If insurance does not cover with provided diagnosis code (R73.09) please notify patient.   Left message on voicemail with detailed information (ok per DPR). Patient to return call if she has questions or concerns, otherwise if patient wishes to continue checking blood sugar she will have to pay out of pocket.

## 2015-06-01 DIAGNOSIS — Z9849 Cataract extraction status, unspecified eye: Secondary | ICD-10-CM | POA: Diagnosis not present

## 2015-06-01 DIAGNOSIS — H43811 Vitreous degeneration, right eye: Secondary | ICD-10-CM | POA: Diagnosis not present

## 2015-06-01 DIAGNOSIS — H18413 Arcus senilis, bilateral: Secondary | ICD-10-CM | POA: Diagnosis not present

## 2015-06-01 DIAGNOSIS — H5212 Myopia, left eye: Secondary | ICD-10-CM | POA: Diagnosis not present

## 2015-06-01 DIAGNOSIS — H52223 Regular astigmatism, bilateral: Secondary | ICD-10-CM | POA: Diagnosis not present

## 2015-06-01 DIAGNOSIS — H43812 Vitreous degeneration, left eye: Secondary | ICD-10-CM | POA: Diagnosis not present

## 2015-06-01 DIAGNOSIS — H40001 Preglaucoma, unspecified, right eye: Secondary | ICD-10-CM | POA: Diagnosis not present

## 2015-06-01 DIAGNOSIS — H40013 Open angle with borderline findings, low risk, bilateral: Secondary | ICD-10-CM | POA: Diagnosis not present

## 2015-06-01 DIAGNOSIS — H40002 Preglaucoma, unspecified, left eye: Secondary | ICD-10-CM | POA: Diagnosis not present

## 2015-06-01 DIAGNOSIS — H5201 Hypermetropia, right eye: Secondary | ICD-10-CM | POA: Diagnosis not present

## 2015-06-01 DIAGNOSIS — H11153 Pinguecula, bilateral: Secondary | ICD-10-CM | POA: Diagnosis not present

## 2015-06-01 DIAGNOSIS — Z961 Presence of intraocular lens: Secondary | ICD-10-CM | POA: Diagnosis not present

## 2015-06-15 ENCOUNTER — Other Ambulatory Visit: Payer: Self-pay | Admitting: Internal Medicine

## 2015-06-26 ENCOUNTER — Other Ambulatory Visit: Payer: Self-pay | Admitting: Internal Medicine

## 2015-07-15 ENCOUNTER — Other Ambulatory Visit: Payer: Self-pay | Admitting: Internal Medicine

## 2015-07-29 ENCOUNTER — Other Ambulatory Visit: Payer: Self-pay | Admitting: Internal Medicine

## 2015-08-15 ENCOUNTER — Other Ambulatory Visit: Payer: Self-pay | Admitting: Internal Medicine

## 2015-08-17 ENCOUNTER — Other Ambulatory Visit: Payer: Self-pay | Admitting: *Deleted

## 2015-08-17 MED ORDER — ZOLPIDEM TARTRATE 5 MG PO TABS: ORAL_TABLET | ORAL | Status: DC

## 2015-08-17 NOTE — Telephone Encounter (Signed)
Patient requested. Printed and faxed to pharmacy.  

## 2015-08-25 ENCOUNTER — Other Ambulatory Visit: Payer: Self-pay | Admitting: Internal Medicine

## 2015-08-26 ENCOUNTER — Other Ambulatory Visit: Payer: Medicare Other

## 2015-08-26 DIAGNOSIS — I10 Essential (primary) hypertension: Secondary | ICD-10-CM

## 2015-08-26 DIAGNOSIS — R7303 Prediabetes: Secondary | ICD-10-CM | POA: Diagnosis not present

## 2015-08-26 DIAGNOSIS — F3341 Major depressive disorder, recurrent, in partial remission: Secondary | ICD-10-CM | POA: Diagnosis not present

## 2015-08-26 DIAGNOSIS — E039 Hypothyroidism, unspecified: Secondary | ICD-10-CM | POA: Diagnosis not present

## 2015-08-26 DIAGNOSIS — E785 Hyperlipidemia, unspecified: Secondary | ICD-10-CM

## 2015-08-27 LAB — HEMOGLOBIN A1C
Est. average glucose Bld gHb Est-mCnc: 123 mg/dL
Hgb A1c MFr Bld: 5.9 % — ABNORMAL HIGH (ref 4.8–5.6)

## 2015-08-27 LAB — BASIC METABOLIC PANEL
BUN/Creatinine Ratio: 24 (ref 11–26)
BUN: 17 mg/dL (ref 8–27)
CO2: 23 mmol/L (ref 18–29)
Calcium: 9.2 mg/dL (ref 8.7–10.3)
Chloride: 101 mmol/L (ref 97–106)
Creatinine, Ser: 0.7 mg/dL (ref 0.57–1.00)
GFR calc Af Amer: 101 mL/min/{1.73_m2} (ref >59)
GFR calc non Af Amer: 87 mL/min/{1.73_m2} (ref >59)
Glucose: 113 mg/dL — ABNORMAL HIGH (ref 65–99)
Potassium: 4.5 mmol/L (ref 3.5–5.2)
Sodium: 140 mmol/L (ref 136–144)

## 2015-08-27 LAB — LIPID PANEL
Chol/HDL Ratio: 2.9 ratio units (ref 0.0–4.4)
Cholesterol, Total: 151 mg/dL (ref 100–199)
HDL: 52 mg/dL (ref >39)
LDL Calculated: 76 mg/dL (ref 0–99)
Triglycerides: 114 mg/dL (ref 0–149)
VLDL Cholesterol Cal: 23 mg/dL (ref 5–40)

## 2015-08-31 ENCOUNTER — Encounter: Payer: Self-pay | Admitting: Internal Medicine

## 2015-08-31 ENCOUNTER — Ambulatory Visit (INDEPENDENT_AMBULATORY_CARE_PROVIDER_SITE_OTHER): Payer: Medicare Other | Admitting: Internal Medicine

## 2015-08-31 VITALS — BP 102/66 | HR 88 | Temp 98.3°F | Resp 20 | Ht 59.0 in | Wt 138.8 lb

## 2015-08-31 DIAGNOSIS — F3341 Major depressive disorder, recurrent, in partial remission: Secondary | ICD-10-CM | POA: Diagnosis not present

## 2015-08-31 DIAGNOSIS — Z23 Encounter for immunization: Secondary | ICD-10-CM

## 2015-08-31 DIAGNOSIS — Z86010 Personal history of colonic polyps: Secondary | ICD-10-CM | POA: Diagnosis not present

## 2015-08-31 DIAGNOSIS — G4700 Insomnia, unspecified: Secondary | ICD-10-CM | POA: Diagnosis not present

## 2015-08-31 DIAGNOSIS — E039 Hypothyroidism, unspecified: Secondary | ICD-10-CM

## 2015-08-31 DIAGNOSIS — E785 Hyperlipidemia, unspecified: Secondary | ICD-10-CM

## 2015-08-31 DIAGNOSIS — R7303 Prediabetes: Secondary | ICD-10-CM

## 2015-08-31 DIAGNOSIS — I10 Essential (primary) hypertension: Secondary | ICD-10-CM

## 2015-08-31 NOTE — Progress Notes (Signed)
Patient ID: Kelli [name], female   DOB: 06-20-1946, 69 y.o.   MRN: [id number]   Location: [facility] Provider: Rexene Edison. Mariea Clonts, D.O., C.M.D.  Goals of Care: Advanced Directive information Does patient have an advance directive?: No, Would patient like information on creating an advanced directive?: Yes - Educational materials given  Chief Complaint  Patient presents with  . Medical Management of Chronic Issues    3 monh follow-up for Hypertension, Hyperlipidemia,copy of labs printed  . Immunizations    wants flu shot today    HPI: Patient is a 69 y.o. female seen in the office today for med mgt of chronic diseases.    Going to the gym twice weekly.  Diet ok partly b/c of teeth.  Bought a meter and bought a glucometer.  AM numbers were elevated.  Wants to check her glucose with hers and with ours and see where she is.  Depression is a little better.  Wondered if I could up her wellbutrin.  She is on the 300mg  daily dose.  Still had three days where she didn't get out of bed.  Normally does better than this.  Her grandson and granddaughter are both on prozac with benefit.  Remembers hearing that prozac fixes anything.  wellbutrin xl is $39.  Has either G or F part D plan.  Was the one that covered most of what she was on now.    Quantity limits on following meds:  Alprazolam, bupropion, telmisartan, zolpidem.    Dentures:  Only has uppers.  She is going to have a soft lining and then a hard lining.  She's been in weekly.  Wonders why the soft lining instead of adhesive.  Has issues with chewing still.  Cannot bite from the front except a banana.  Review of Systems:  Review of Systems  Constitutional: Negative for malaise/fatigue.  HENT: Negative for congestion.        Denture problems  Eyes: Negative for blurred vision.       Glasses  Respiratory: Negative for shortness of breath.   Cardiovascular: Negative for chest pain.  Gastrointestinal: Negative for abdominal pain  and constipation.  Genitourinary: Negative for dysuria.  Musculoskeletal: Negative for falls.  Skin: Negative for rash.  Neurological: Negative for dizziness.  Endo/Heme/Allergies: Does not bruise/bleed easily.  Psychiatric/Behavioral: Positive for depression. Negative for memory loss. The patient is nervous/anxious and has insomnia.        Improved control    Past Medical History  Diagnosis Date  . Diverticulosis   . Sigmoid polyp   . Colon polyp     Tubular Adenoma  . Colon polyp, hyperplastic   . Internal hemorrhoid   . Thyroid disease   . Hyperlipidemia   . Anxiety   . Hypertension   . Hypertonicity of bladder   . Insomnia, unspecified   . Flushing   . Other abnormal blood chemistry     Past Surgical History  Procedure Laterality Date  . Ectopic pregnancy surgery  1965  . Breast enhancement surgery  1969    has bilateral silicone implants  . Cholecystectomy    . Rotator cuff repair  2013    Allergies  Allergen Reactions  . Latex Swelling      Medication List       This list is accurate as of: 08/31/15  4:20 PM.  Always use your most recent med list.               ALPRAZolam 1  MG tablet  Commonly known as:  XANAX  TAKE TWO TABLETS BY MOUTH ONCE DAILY AT BEDTIME AS NEEDED FOR ANXIETY     buPROPion 300 MG 24 hr tablet  Commonly known as:  WELLBUTRIN XL  Take 1 tablet (300 mg total) by mouth daily.     estradiol 1 MG tablet  Commonly known as:  ESTRACE  Take 1 mg by mouth daily.     glucose blood test strip  Commonly known as:  FREESTYLE LITE  Use as instructed to check blood sugar daily. Dx: R73.09     levothyroxine 88 MCG tablet  Commonly known as:  SYNTHROID, LEVOTHROID  TAKE ONE TABLET BY MOUTH ONCE DAILY     medroxyPROGESTERone 5 MG tablet  Commonly known as:  PROVERA  Take 5 mg by mouth daily.     polyethylene glycol packet  Commonly known as:  MIRALAX / GLYCOLAX  Take 17 g by mouth daily as needed for mild constipation.      polyethylene glycol powder powder  Commonly known as:  GLYCOLAX/MIRALAX  TAKE ONE CAPFUL BY MOUTH TWICE DAILY AS NEEDED     simvastatin 40 MG tablet  Commonly known as:  ZOCOR  TAKE ONE TABLET BY MOUTH ONCE DAILY     telmisartan 40 MG tablet  Commonly known as:  MICARDIS  TAKE ONE-HALF TABLET BY MOUTH ONCE DAILY     tizanidine 2 MG capsule  Commonly known as:  ZANAFLEX     zolpidem 5 MG tablet  Commonly known as:  AMBIEN  Take one tablet by mouth at bedtime as needed for sleep        Health Maintenance  Topic Date Due  . Hepatitis C Screening  1946-05-11  . URINE MICROALBUMIN  10/06/1953  . OPHTHALMOLOGY EXAM  06/04/2015  . FOOT EXAM  08/05/2015  . MAMMOGRAM  01/05/2016 (Originally 03/02/2012)  . PNA vac Low Risk Adult (2 of 2 - PPSV23) 01/05/2016  . HEMOGLOBIN A1C  02/23/2016  . INFLUENZA VACCINE  05/03/2016  . TETANUS/TDAP  10/03/2018  . COLONOSCOPY  03/15/2020  . DEXA SCAN  Completed  . ZOSTAVAX  Completed    Physical Exam: Filed Vitals:   08/31/15 1551  BP: 102/66  Pulse: 88  Temp: 98.3 F (36.8 C)  TempSrc: Oral  Resp: 20  Height: 4\' 11"  (1.499 m)  Weight: 138 lb 12.8 oz (62.959 kg)  SpO2: 97%   Body mass index is 28.02 kg/(m^2). Physical Exam  Constitutional: She is oriented to person, place, and time. She appears well-developed and well-nourished. No distress.  Cardiovascular: Normal rate, regular rhythm, normal heart sounds and intact distal pulses.   Pulmonary/Chest: Effort normal and breath sounds normal. No respiratory distress.  Abdominal: Soft. Bowel sounds are normal. She exhibits no distension. There is no tenderness.  Musculoskeletal: Normal range of motion.  Neurological: She is alert and oriented to person, place, and time.  Skin: Skin is warm and dry.  Dry scaly skin of scalp by ears bilaterally  Psychiatric: She has a normal mood and affect.    Labs reviewed: Basic Metabolic Panel:  Recent Labs  10/06/14 1640 01/05/15 1434  04/07/15 1620 08/26/15 1314  NA 141  --  140 140  K 4.0  --  4.1 4.5  CL 101  --  103 101  CO2 25  --  24 23  GLUCOSE 144*  --  128* 113*  BUN 13  --  14 17  CREATININE 0.75  --  0.71 0.70  CALCIUM 9.2  --  9.0 9.2  TSH 15.520* 0.817 0.919  --    Liver Function Tests:  Recent Labs  10/06/14 1640 04/07/15 1620  AST 22 19  ALT 21 15  ALKPHOS 63 55  BILITOT 0.5 0.3  PROT 7.2 7.5  ALBUMIN 4.1 4.2   No results for input(s): LIPASE, AMYLASE in the last 8760 hours. No results for input(s): AMMONIA in the last 8760 hours. CBC:  Recent Labs  10/06/14 1640 04/07/15 1620  WBC 6.6 6.6  NEUTROABS 3.8 3.7  HGB 12.0  --   HCT 35.2 36.9  MCV 92  --   PLT 339  --    Lipid Panel:  Recent Labs  10/06/14 1640 04/07/15 1620 08/26/15 1314  CHOL 217* 189 151  HDL 46 48 52  LDLCALC 145* 108* 76  TRIG 129 165* 114  CHOLHDL 4.7* 3.9 2.9   Lab Results  Component Value Date   HGBA1C 5.9* 08/26/2015    Assessment/Plan 1. Prediabetes - control of glucose improving since last time as she's been eating and exercising better - Hemoglobin A1c; Future  2. Essential hypertension, benign -bp at goal with current regimen -sounds like we will need to change her arb in the new year to another one (losartan maybe) due to "quantity limit" from insurance - Basic metabolic panel; Future  3. Hyperlipidemia - lipids improved since last time, cont current routine with zocor and reassess in 3 mos, cont diet and exercise - Hemoglobin A1c; Future - Lipid panel; Future  4. Hypothyroidism, unspecified hypothyroidism type - cont synthroid and reassess tsh before next visit - TSH; Future  5. Insomnia -continues her Lorrin Mais which she does not do well without and could not afford belsomra before, melatonin did not work  6. Recurrent major depressive disorder, in partial remission ([place]) -cont wellbutrin 300mg  daily and I'd like to add something else to it to try to achieve complete  remission--consider viibryd or trintellix which are weight neutral -has not had success with zoloft or celexa in the past and these cause weight gain  7. Need for prophylactic vaccination and inoculation against influenza - Flu Vaccine QUAD 36+ mos PF IM (Fluarix & Fluzone Quad PF)  8. History of colonic polyps - due for cscope and wants to go to [facility] - Ambulatory referral to Gastroenterology   Labs/tests ordered:  Orders Placed This Encounter  Procedures  . Flu Vaccine QUAD 36+ mos PF IM (Fluarix & Fluzone Quad PF)  . Basic metabolic panel    Standing Status: Future     Number of Occurrences:      Standing Expiration Date: 02/28/2016    Order Specific Question:  Has the patient fasted?    Answer:  Yes  . Hemoglobin A1c    Standing Status: Future     Number of Occurrences:      Standing Expiration Date: 02/28/2016  . Lipid panel    Standing Status: Future     Number of Occurrences:      Standing Expiration Date: 02/28/2016    Order Specific Question:  Has the patient fasted?    Answer:  Yes  . TSH    Standing Status: Future     Number of Occurrences:      Standing Expiration Date: 02/28/2016  . Ambulatory referral to Gastroenterology    Referral Priority:  Routine    Referral Type:  Consultation    Referral Reason:  Specialty Services Required    Number of Visits Requested:  1    Next appt:  3 mos for med mgt with labs before  Renesmae Donahey L. Minas Bonser, D.O. [facility] [address], [place] [address] Cell Phone (Mon-Fri 8am-5pm):  [phone] On Call:  [phone] & follow prompts after 5pm & weekends Office Phone:  [phone] Office Fax:  [phone]

## 2015-08-31 NOTE — Patient Instructions (Addendum)
Call your insurance to ask about possible additional antidepressants: viibryd trintellix  Keep up the good work with going to the gym.  See if you can increase to three times weekly.

## 2015-09-01 ENCOUNTER — Other Ambulatory Visit: Payer: Self-pay | Admitting: Internal Medicine

## 2015-09-07 ENCOUNTER — Other Ambulatory Visit: Payer: Self-pay | Admitting: Internal Medicine

## 2015-09-07 ENCOUNTER — Telehealth: Payer: Self-pay | Admitting: *Deleted

## 2015-09-07 DIAGNOSIS — I10 Essential (primary) hypertension: Secondary | ICD-10-CM

## 2015-09-07 MED ORDER — LOSARTAN POTASSIUM 25 MG PO TABS: 25.0000 mg | ORAL_TABLET | Freq: Every day | ORAL | Status: DC

## 2015-09-07 NOTE — Telephone Encounter (Signed)
Received Drug Change Summary from patient stating [employer] is not going to cover Telmisartan and needs it changed to an alternative: Losartan, Valsartan or Irbesartan. Note given to Dr. Mariea Clonts for review.

## 2015-09-08 NOTE — Telephone Encounter (Signed)
Per Dr. Connye Burkitt to Losartan (Cozaar) 25mg  Once daily. Dr. Mariea Clonts faxed Rx to [facility] Patient notified.

## 2015-09-10 ENCOUNTER — Telehealth: Payer: Self-pay | Admitting: Internal Medicine

## 2015-09-10 NOTE — Telephone Encounter (Signed)
Received records from [facility]. Patient is requesting to see Dr. Hilarie Fredrickson. Records placed on Dr. Vena Rua desk for review.

## 2015-09-15 ENCOUNTER — Other Ambulatory Visit: Payer: Self-pay | Admitting: Internal Medicine

## 2015-09-16 ENCOUNTER — Encounter: Payer: Self-pay | Admitting: Internal Medicine

## 2015-09-16 NOTE — Telephone Encounter (Signed)
Dr. Pyrtle reviewed records and has accepted patient. Ok to schedule Direct Colon. Colonoscopy scheduled. °

## 2015-10-02 ENCOUNTER — Other Ambulatory Visit: Payer: Self-pay | Admitting: Internal Medicine

## 2015-10-13 ENCOUNTER — Other Ambulatory Visit: Payer: Self-pay | Admitting: Internal Medicine

## 2015-10-17 ENCOUNTER — Other Ambulatory Visit: Payer: Self-pay | Admitting: Nurse Practitioner

## 2015-10-18 ENCOUNTER — Other Ambulatory Visit: Payer: Self-pay | Admitting: Internal Medicine

## 2015-10-19 ENCOUNTER — Other Ambulatory Visit: Payer: Self-pay | Admitting: Internal Medicine

## 2015-10-19 ENCOUNTER — Other Ambulatory Visit: Payer: Self-pay | Admitting: Nurse Practitioner

## 2015-11-10 ENCOUNTER — Ambulatory Visit (AMBULATORY_SURGERY_CENTER): Payer: Self-pay | Admitting: *Deleted

## 2015-11-10 VITALS — Ht 59.0 in | Wt 133.0 lb

## 2015-11-10 DIAGNOSIS — Z86010 Personal history of colonic polyps: Secondary | ICD-10-CM

## 2015-11-10 MED ORDER — NA SULFATE-K SULFATE-MG SULF 17.5-3.13-1.6 GM/177ML PO SOLN: 1.0000 | Freq: Once | ORAL | Status: DC

## 2015-11-10 NOTE — Progress Notes (Signed)
No egg or soy allergy known to patient  No issues with past sedation with any surgeries  or procedures, no intubation problems  No diet pills per patient No home 02 use per patient  No blood thinners per patient    

## 2015-11-18 ENCOUNTER — Telehealth: Payer: Self-pay

## 2015-11-18 NOTE — Telephone Encounter (Signed)
Letter mailed to patient regarding overdue mammogram. M.Simpson 

## 2015-11-19 ENCOUNTER — Encounter: Payer: Self-pay | Admitting: Internal Medicine

## 2015-11-23 ENCOUNTER — Other Ambulatory Visit: Payer: Self-pay | Admitting: Internal Medicine

## 2015-11-24 ENCOUNTER — Encounter: Payer: Self-pay | Admitting: Internal Medicine

## 2015-11-24 ENCOUNTER — Ambulatory Visit (AMBULATORY_SURGERY_CENTER): Payer: Medicare Other | Admitting: Internal Medicine

## 2015-11-24 VITALS — BP 105/60 | HR 76 | Temp 95.7°F | Resp 22 | Ht 59.0 in | Wt 133.0 lb

## 2015-11-24 DIAGNOSIS — D124 Benign neoplasm of descending colon: Secondary | ICD-10-CM

## 2015-11-24 DIAGNOSIS — D120 Benign neoplasm of cecum: Secondary | ICD-10-CM

## 2015-11-24 DIAGNOSIS — D123 Benign neoplasm of transverse colon: Secondary | ICD-10-CM

## 2015-11-24 DIAGNOSIS — Z86010 Personal history of colonic polyps: Secondary | ICD-10-CM

## 2015-11-24 DIAGNOSIS — D122 Benign neoplasm of ascending colon: Secondary | ICD-10-CM

## 2015-11-24 DIAGNOSIS — D125 Benign neoplasm of sigmoid colon: Secondary | ICD-10-CM

## 2015-11-24 DIAGNOSIS — Z8673 Personal history of transient ischemic attack (TIA), and cerebral infarction without residual deficits: Secondary | ICD-10-CM | POA: Diagnosis not present

## 2015-11-24 DIAGNOSIS — K635 Polyp of colon: Secondary | ICD-10-CM | POA: Diagnosis not present

## 2015-11-24 DIAGNOSIS — I10 Essential (primary) hypertension: Secondary | ICD-10-CM | POA: Diagnosis not present

## 2015-11-24 HISTORY — PX: COLONOSCOPY: SHX174

## 2015-11-24 MED ORDER — SODIUM CHLORIDE 0.9 % IV SOLN: 500.0000 mL | INTRAVENOUS | Status: DC

## 2015-11-24 NOTE — Progress Notes (Signed)
  [facility] Post-op Note  Patient: Kelli [name]  Procedure(s) Performed: colonoscopy  Patient Location: [facility] - Recovery Area  Anesthesia Type: Deep Sedation/Propofol  Level of Consciousness: awake, oriented and patient cooperative  Airway and Oxygen Therapy: Patient Spontanous Breathing  Post-op Pain: none  Post-op Assessment:  Post-op Vital signs reviewed, Patient's Cardiovascular Status Stable, Respiratory Function Stable, Patent Airway, No signs of Nausea or vomiting and Pain level controlled  Post-op Vital Signs: Reviewed and stable  Complications: No apparent anesthesia complications  Keina Mutch E 12:22 PM

## 2015-11-24 NOTE — Op Note (Signed)
[facility]  [address] [place], [id number]   COLONOSCOPY PROCEDURE REPORT  PATIENT: Kelli [name], Kelli [name]  MR#: [id number] BIRTHDATE: 04/17/1946 , 70  yrs. old GENDER: female ENDOSCOPIST: Jerene Bears, MD PROCEDURE DATE:  11/24/2015 PROCEDURE:   Colonoscopy, surveillance and Colonoscopy with snare polypectomy First Screening Colonoscopy - Avg.  risk and is 50 yrs.  old or older - No.  Prior Negative Screening - Now for repeat screening. N/A  History of Adenoma - Now for follow-up colonoscopy & has been > or = to 3 yrs.  Yes hx of adenoma.  Has been 3 or more years since last colonoscopy.  Polyps removed today? Yes ASA CLASS:   Class III INDICATIONS:Surveillance due to prior colonic neoplasia and PH Colon Adenoma, last colonoscopy 2011. MEDICATIONS: Monitored anesthesia care and Propofol 350 mg IV  DESCRIPTION OF PROCEDURE:   After the risks benefits and alternatives of the procedure were thoroughly explained, informed consent was obtained.  The digital rectal exam revealed no rectal mass.   The [id number]  endoscope was introduced through the anus and advanced to the cecum, which was identified by both the appendix and ileocecal valve. No adverse events experienced. The quality of the prep was fair with SuPrep clearing to good with copious irrigation and lavage.  The instrument was then slowly withdrawn as the colon was fully examined. Estimated blood loss is zero unless otherwise noted in this procedure report.      COLON FINDINGS: A sessile polyp measuring 5 mm in size was found at the cecum.  A polypectomy was performed with a cold snare.  The resection was complete, the polyp tissue was completely retrieved and sent to histology.   A sessile polyp measuring 15 mm in size was found at the ileocecal valve.  A polypectomy was performed using snare cautery.  The resection was complete, the polyp tissue was completely retrieved and sent to  histology.   A sessile polyp measuring 10 mm in size was found in the ascending colon.  A polypectomy was performed using snare cautery.  The resection was complete, the polyp tissue was completely retrieved and sent to histology.   A sessile polyp measuring 12 mm in size was found in the transverse colon.  A polypectomy was performed using snare cautery.  The resection was complete, the polyp tissue was completely retrieved and sent to histology.   Two sessile polyps ranging from 5 to 11mm in size were found in the transverse colon. Polypectomies were performed using snare cautery.  The resection was complete, the polyp tissue was completely retrieved and sent to histology.   Three sessile polyps ranging between 3-35mm in size were found in the descending colon.  Polypectomies were performed with a cold snare.  The resection was complete, the polyp tissue was partially retrieved (1 5 mm polyp resected but not retrieved) and sent to histology.   A sessile polyp measuring 5 mm in size was found in the sigmoid colon.  A polypectomy was performed with a cold snare.  The resection was complete, the polyp tissue was completely retrieved and sent to histology.   There was mild diverticulosis noted in the transverse colon, descending colon, and sigmoid colon. Colon was tortuous.   Retroflexion was not performed due to a narrow rectal vault, but internal hemorrhoids seen during inspection of anorectal canal. The time to cecum = 7.5 Withdrawal time = 31.7   The scope was withdrawn and the procedure completed. COMPLICATIONS: There  were no immediate complications.  ENDOSCOPIC IMPRESSION: 1.   Sessile polyp was found at the cecum; polypectomy was performed with a cold snare 2.   Sessile polyp was found at the ileocecal valve; polypectomy was performed using snare cautery 3.   Sessile polyp was found in the ascending colon; polypectomy was performed using snare cautery 4.   Sessile polyp was found  in the transverse colon; polypectomy was performed using snare cautery 5.   Two sessile polyps ranging from 5 to 34mm in size were found in the transverse colon; polypectomies were performed using snare cautery 6.   Three sessile polyps ranging between 3-35mm in size were found in the descending colon; polypectomies were performed with a cold snare 7.   Sessile polyp was found in the sigmoid colon; polypectomy was performed with a cold snare 8.   Mild diverticulosis was noted in the transverse colon, descending colon, and sigmoid colon  RECOMMENDATIONS: 1.  Avoid all NSAIDs for the next 2 weeks. 2.  Await pathology results 3.  High fiber diet 4.  Timing of repeat colonoscopy will be determined by pathology findings. 5.  You will receive a letter within 1-2 weeks with the results of your biopsy as well as final recommendations.  Please call my office if you have not received a letter after 3 weeks.  eSigned:  Jerene Bears, MD 11/24/2015 12:30 PM   cc:  the patient, PCP   PATIENT NAME:  Kelli, [name] MR#: [id number]

## 2015-11-24 NOTE — Patient Instructions (Signed)
Discharge instructions given. Handouts on polyps and diverticulosis. Resume previous medications. Avoid all NSAIDS for the next 2 weeks. YOU HAD AN ENDOSCOPIC PROCEDURE TODAY AT [facility]:   Refer to the procedure report that was given to you for any specific questions about what was found during the examination.  If the procedure report does not answer your questions, please call your gastroenterologist to clarify.  If you requested that your care partner not be given the details of your procedure findings, then the procedure report has been included in a sealed envelope for you to review at your convenience later.  YOU SHOULD EXPECT: Some feelings of bloating in the abdomen. Passage of more gas than usual.  Walking can help get rid of the air that was put into your GI tract during the procedure and reduce the bloating. If you had a lower endoscopy (such as a colonoscopy or flexible sigmoidoscopy) you may notice spotting of blood in your stool or on the toilet paper. If you underwent a bowel prep for your procedure, you may not have a normal bowel movement for a few days.  Please Note:  You might notice some irritation and congestion in your nose or some drainage.  This is from the oxygen used during your procedure.  There is no need for concern and it should clear up in a day or so.  SYMPTOMS TO REPORT IMMEDIATELY:   Following lower endoscopy (colonoscopy or flexible sigmoidoscopy):  Excessive amounts of blood in the stool  Significant tenderness or worsening of abdominal pains  Swelling of the abdomen that is new, acute  Fever of 100F or higher   Following upper endoscopy (EGD)  Vomiting of blood or coffee ground material  New chest pain or pain under the shoulder blades  Painful or persistently difficult swallowing  New shortness of breath  Fever of 100F or higher  Black, tarry-looking stools  For urgent or emergent issues, a gastroenterologist can be reached at  any hour by calling [phone].   DIET: Your first meal following the procedure should be a small meal and then it is ok to progress to your normal diet. Heavy or fried foods are harder to digest and may make you feel nauseous or bloated.  Likewise, meals heavy in dairy and vegetables can increase bloating.  Drink plenty of fluids but you should avoid alcoholic beverages for 24 hours.  ACTIVITY:  You should plan to take it easy for the rest of today and you should NOT DRIVE or use heavy machinery until tomorrow (because of the sedation medicines used during the test).    FOLLOW UP: Our staff will call the number listed on your records the next business day following your procedure to check on you and address any questions or concerns that you may have regarding the information given to you following your procedure. If we do not reach you, we will leave a message.  However, if you are feeling well and you are not experiencing any problems, there is no need to return our call.  We will assume that you have returned to your regular daily activities without incident.  If any biopsies were taken you will be contacted by phone or by letter within the next 1-3 weeks.  Please call us at [phone] if you have not heard about the biopsies in 3 weeks.    SIGNATURES/CONFIDENTIALITY: You and/or your care partner have signed paperwork which will be entered into your electronic medical record.  These signatures attest to the fact that that the information above on your After Visit Summary has been reviewed and is understood.  Full responsibility of the confidentiality of this discharge information lies with you and/or your care-partner.YOU HAD AN ENDOSCOPIC PROCEDURE TODAY AT [facility]:   Refer to the procedure report that was given to you for any specific questions about what was found during the examination.  If the procedure report does not answer your questions, please call your  gastroenterologist to clarify.  If you requested that your care partner not be given the details of your procedure findings, then the procedure report has been included in a sealed envelope for you to review at your convenience later.  YOU SHOULD EXPECT: Some feelings of bloating in the abdomen. Passage of more gas than usual.  Walking can help get rid of the air that was put into your GI tract during the procedure and reduce the bloating. If you had a lower endoscopy (such as a colonoscopy or flexible sigmoidoscopy) you may notice spotting of blood in your stool or on the toilet paper. If you underwent a bowel prep for your procedure, you may not have a normal bowel movement for a few days.  Please Note:  You might notice some irritation and congestion in your nose or some drainage.  This is from the oxygen used during your procedure.  There is no need for concern and it should clear up in a day or so.  SYMPTOMS TO REPORT IMMEDIATELY:   Following lower endoscopy (colonoscopy or flexible sigmoidoscopy):  Excessive amounts of blood in the stool  Significant tenderness or worsening of abdominal pains  Swelling of the abdomen that is new, acute  Fever of 100F or higher   For urgent or emergent issues, a gastroenterologist can be reached at any hour by calling [phone].   DIET: Your first meal following the procedure should be a small meal and then it is ok to progress to your normal diet. Heavy or fried foods are harder to digest and may make you feel nauseous or bloated.  Likewise, meals heavy in dairy and vegetables can increase bloating.  Drink plenty of fluids but you should avoid alcoholic beverages for 24 hours.  ACTIVITY:  You should plan to take it easy for the rest of today and you should NOT DRIVE or use heavy machinery until tomorrow (because of the sedation medicines used during the test).    FOLLOW UP: Our staff will call the number listed on your records the next business  day following your procedure to check on you and address any questions or concerns that you may have regarding the information given to you following your procedure. If we do not reach you, we will leave a message.  However, if you are feeling well and you are not experiencing any problems, there is no need to return our call.  We will assume that you have returned to your regular daily activities without incident.  If any biopsies were taken you will be contacted by phone or by letter within the next 1-3 weeks.  Please call us at [phone] if you have not heard about the biopsies in 3 weeks.    SIGNATURES/CONFIDENTIALITY: You and/or your care partner have signed paperwork which will be entered into your electronic medical record.  These signatures attest to the fact that that the information above on your After Visit Summary has been reviewed and is understood.  Full responsibility  of the confidentiality of this discharge information lies with you and/or your care-partner.

## 2015-11-24 NOTE — Progress Notes (Signed)
No egg or soy allergy known to patient  No issues with past sedation with any surgeries  or procedures, no intubation problems  No diet pills per patient No home 02 use per patient  No blood thinners per patient    

## 2015-11-24 NOTE — Progress Notes (Signed)
Called to room to assist during endoscopic procedure.  Patient ID and intended procedure confirmed with present staff. Received instructions for my participation in the procedure from the performing physician.  

## 2015-11-25 ENCOUNTER — Telehealth: Payer: Self-pay | Admitting: Emergency Medicine

## 2015-11-25 NOTE — Telephone Encounter (Signed)
Left message, no identifier, f/u 

## 2015-11-27 ENCOUNTER — Other Ambulatory Visit: Payer: Medicare Other

## 2015-11-27 DIAGNOSIS — I10 Essential (primary) hypertension: Secondary | ICD-10-CM | POA: Diagnosis not present

## 2015-11-27 DIAGNOSIS — E785 Hyperlipidemia, unspecified: Secondary | ICD-10-CM | POA: Diagnosis not present

## 2015-11-27 DIAGNOSIS — E039 Hypothyroidism, unspecified: Secondary | ICD-10-CM | POA: Diagnosis not present

## 2015-11-27 DIAGNOSIS — R7303 Prediabetes: Secondary | ICD-10-CM | POA: Diagnosis not present

## 2015-11-27 DIAGNOSIS — B181 Chronic viral hepatitis B without delta-agent: Secondary | ICD-10-CM | POA: Diagnosis not present

## 2015-11-28 LAB — HEMOGLOBIN A1C
Est. average glucose Bld gHb Est-mCnc: 120 mg/dL
Hgb A1c MFr Bld: 5.8 % — ABNORMAL HIGH (ref 4.8–5.6)

## 2015-11-28 LAB — BASIC METABOLIC PANEL
BUN/Creatinine Ratio: 18 (ref 11–26)
BUN: 14 mg/dL (ref 8–27)
CO2: 22 mmol/L (ref 18–29)
Calcium: 8.9 mg/dL (ref 8.7–10.3)
Chloride: 102 mmol/L (ref 96–106)
Creatinine, Ser: 0.79 mg/dL (ref 0.57–1.00)
GFR calc Af Amer: 86 mL/min/{1.73_m2} (ref >59)
GFR calc non Af Amer: 75 mL/min/{1.73_m2} (ref >59)
Glucose: 106 mg/dL — ABNORMAL HIGH (ref 65–99)
Potassium: 4.1 mmol/L (ref 3.5–5.2)
Sodium: 140 mmol/L (ref 134–144)

## 2015-11-28 LAB — LIPID PANEL
Chol/HDL Ratio: 3.2 ratio (ref 0.0–4.4)
Cholesterol, Total: 146 mg/dL (ref 100–199)
HDL: 46 mg/dL
LDL Calculated: 80 mg/dL (ref 0–99)
Triglycerides: 98 mg/dL (ref 0–149)
VLDL Cholesterol Cal: 20 mg/dL (ref 5–40)

## 2015-11-28 LAB — TSH: TSH: 2.29 u[IU]/mL (ref 0.450–4.500)

## 2015-11-30 ENCOUNTER — Ambulatory Visit (INDEPENDENT_AMBULATORY_CARE_PROVIDER_SITE_OTHER): Payer: Medicare Other | Admitting: Internal Medicine

## 2015-11-30 ENCOUNTER — Encounter: Payer: Self-pay | Admitting: Internal Medicine

## 2015-11-30 ENCOUNTER — Other Ambulatory Visit: Payer: Self-pay | Admitting: Internal Medicine

## 2015-11-30 VITALS — BP 112/72 | HR 82 | Temp 98.4°F | Ht 59.0 in | Wt 133.0 lb

## 2015-11-30 DIAGNOSIS — I10 Essential (primary) hypertension: Secondary | ICD-10-CM | POA: Diagnosis not present

## 2015-11-30 DIAGNOSIS — E039 Hypothyroidism, unspecified: Secondary | ICD-10-CM | POA: Diagnosis not present

## 2015-11-30 DIAGNOSIS — G4700 Insomnia, unspecified: Secondary | ICD-10-CM | POA: Diagnosis not present

## 2015-11-30 DIAGNOSIS — K635 Polyp of colon: Secondary | ICD-10-CM | POA: Diagnosis not present

## 2015-11-30 DIAGNOSIS — H8110 Benign paroxysmal vertigo, unspecified ear: Secondary | ICD-10-CM

## 2015-11-30 DIAGNOSIS — F3341 Major depressive disorder, recurrent, in partial remission: Secondary | ICD-10-CM | POA: Diagnosis not present

## 2015-11-30 DIAGNOSIS — R7303 Prediabetes: Secondary | ICD-10-CM

## 2015-11-30 DIAGNOSIS — E785 Hyperlipidemia, unspecified: Secondary | ICD-10-CM | POA: Diagnosis not present

## 2015-11-30 NOTE — Patient Instructions (Signed)
Epley Maneuver Self-Care  WHAT IS THE EPLEY MANEUVER?  The Epley maneuver is an exercise you can do to relieve symptoms of benign paroxysmal positional vertigo (BPPV). This condition is often just referred to as vertigo. BPPV is caused by the movement of tiny crystals (canaliths) inside your inner ear. The accumulation and movement of canaliths in your inner ear causes a sudden spinning sensation (vertigo) when you move your head to certain positions. Vertigo usually lasts about 30 seconds. BPPV usually occurs in just one ear. If you get vertigo when you lie on your left side, you probably have BPPV in your left ear. Your health care provider can tell you which ear is involved.   BPPV may be caused by a head injury. Many people older than 50 get BPPV for unknown reasons. If you have been diagnosed with BPPV, your health care provider may teach you how to do this maneuver. BPPV is not life threatening (benign) and usually goes away in time.   WHEN SHOULD I PERFORM THE EPLEY MANEUVER?  You can do this maneuver at home whenever you have symptoms of vertigo. You may do the Epley maneuver up to 3 times a day until your symptoms of vertigo go away.  HOW SHOULD I DO THE EPLEY MANEUVER?  1. Sit on the edge of a bed or table with your back straight. Your legs should be extended or hanging over the edge of the bed or table.    2. Turn your head halfway toward the affected ear.    3. Lie backward quickly with your head turned until you are lying flat on your back. You may want to position a pillow under your shoulders.    4. Hold this position for 30 seconds. You may experience an attack of vertigo. This is normal. Hold this position until the vertigo stops.  5. Then turn your head to the opposite direction until your unaffected ear is facing the floor.    6. Hold this position for 30 seconds. You may experience an attack of vertigo. This is normal. Hold this position until the vertigo stops.  7. Now turn your whole body to  the same side as your head. Hold for another 30 seconds.    8. You can then sit back up.  ARE THERE RISKS TO THIS MANEUVER?  In some cases, you may have other symptoms (such as changes in your vision, weakness, or numbness). If you have these symptoms, stop doing the maneuver and call your health care provider. Even if doing these maneuvers relieves your vertigo, you may still have dizziness. Dizziness is the sensation of light-headedness but without the sensation of movement. Even though the Epley maneuver may relieve your vertigo, it is possible that your symptoms will return within 5 years.  WHAT SHOULD I DO AFTER THIS MANEUVER?  After doing the Epley maneuver, you can return to your normal activities. Ask your doctor if there is anything you should do at home to prevent vertigo. This may include:  · Sleeping with two or more pillows to keep your head elevated.  · Not sleeping on the side of your affected ear.  · Getting up slowly from bed.  · Avoiding sudden movements during the day.  · Avoiding extreme head movement, like looking up or bending over.  · Wearing a cervical collar to prevent sudden head movements.  WHAT SHOULD I DO IF MY SYMPTOMS GET WORSE?  Call your health care provider if your vertigo gets worse. Call your provider right way if   you have other symptoms, including:   · Nausea.  · Vomiting.  · Headache.  · Weakness.  · Numbness.  · Vision changes.     This information is not intended to replace advice given to you by your health care provider. Make sure you discuss any questions you have with your health care provider.     Document Released: 09/24/2013 Document Reviewed: 09/24/2013  [employer] Interactive Patient Education ©2016 [employer]

## 2015-11-30 NOTE — Progress Notes (Signed)
Patient ID: Kelli [name], female   DOB: [birth year], 70 y.o.   MRN: [id number]   Location:  [facility] clinic Provider:  Emmons Toth L. Mariea Clonts, D.O., C.M.D.  Code Status: full code Goals of Care:  Advanced Directives 11/30/2015  Does patient have an advance directive? No  Type of Advance Directive -  Does patient want to make changes to advanced directive? -  Copy of advanced directive(s) in chart? -  Would patient like information on creating an advanced directive? -     Chief Complaint  Patient presents with  . Medical Management of Chronic Issues    medication management, blood pressure, thyroid, insomnia, hyperglycemia  . Diarrhea    for couple of months    HPI: Patient is a 70 y.o. female seen today for medical management of chronic diseases.    Going to gym 2-3x per week.  Has a cookie or cupcake daily though.  Otherwise eating healthy.  Had scope with 10 sessile polyps.  Says Dr. Hilarie Fredrickson mentioned something about possibly needing to remove part of her colon if she has cancer and it frightened her.    Having episodes where she can't get a deep breath associated with anxiety like she had when her anxiety began.  Says when she is out and about she is good.  Says she is sociable.  Has done some work at home (had been too lethargic and overwhelmed to do housework).  Says she will make headway and then give up.  Her daughter is helping her some.  Still does not sleep well.  Continues her chronic ambien therapy.  Was able for a while to take one alprazolam,her usual 5mg  ambien and turn off the tv, but not so good since cscope.  Has been counting sheep and stayed up until 1230/1am, then wakes back up at 4am, and watches tv until sleepy.  Trying not to take extra alprazolam b/c she gets groggy the next day.    At night, when she's on her back and goes to turn over and once on the mat doing stretches at the gym, the room begins to spin.  Is turning her head more slowly.  Stays still until the  episode passes.    Refuses mammogram with her breast implants.  Says for past couple of months, her urine will start and stop, start and stop.  Has to wait to finish.  Drinking more water has helped some.  Past Medical History  Diagnosis Date  . Diverticulosis   . Sigmoid polyp   . Colon polyp     Tubular Adenoma  . Colon polyp, hyperplastic   . Internal hemorrhoid   . Thyroid disease   . Hyperlipidemia   . Anxiety   . Hypertension   . Hypertonicity of bladder   . Insomnia, unspecified   . Flushing   . Other abnormal blood chemistry   . DDD (degenerative disc disease), lumbar   . Cataract     BILATERAL REMOVAL  . Chronic kidney disease     KIDNEY STONES 2 TIMES IN 20 YEARS     Past Surgical History  Procedure Laterality Date  . Ectopic pregnancy surgery  1965  . Breast enhancement surgery  1969    has bilateral silicone implants  . Cholecystectomy    . Rotator cuff repair  2013  . Colonoscopy  11/24/2015    Dr. Hilarie Fredrickson  . Polypectomy      Allergies  Allergen Reactions  . Latex Swelling  Medication List       This list is accurate as of: 11/30/15  3:27 PM.  Always use your most recent med list.               ALPRAZolam 1 MG tablet  Commonly known as:  XANAX  TAKE TWO TABLETS BY MOUTH AT BEDTIME AS NEEDED FOR ANXIETY     buPROPion 300 MG 24 hr tablet  Commonly known as:  WELLBUTRIN XL  Take 1 tablet (300 mg total) by mouth daily.     estradiol 1 MG tablet  Commonly known as:  ESTRACE  Take 1 mg by mouth daily.     glucose blood test strip  Commonly known as:  FREESTYLE LITE  Use as instructed to check blood sugar daily. Dx: R73.09     levothyroxine 88 MCG tablet  Commonly known as:  SYNTHROID, LEVOTHROID  TAKE ONE TABLET BY MOUTH ONCE DAILY     losartan 25 MG tablet  Commonly known as:  COZAAR  Take 1 tablet (25 mg total) by mouth daily.     medroxyPROGESTERone 5 MG tablet  Commonly known as:  PROVERA  Take 5 mg by mouth daily.      polyethylene glycol packet  Commonly known as:  MIRALAX / GLYCOLAX  Take 17 g by mouth daily as needed for mild constipation.     polyethylene glycol powder powder  Commonly known as:  GLYCOLAX/MIRALAX  TAKE ONE CAPFUL BY MOUTH TWICE DAILY AS NEEDED     simvastatin 40 MG tablet  Commonly known as:  ZOCOR  TAKE ONE TABLET BY MOUTH ONCE DAILY     tizanidine 2 MG capsule  Commonly known as:  ZANAFLEX     zolpidem 5 MG tablet  Commonly known as:  AMBIEN  TAKE ONE TABLET BY MOUTH AT BEDTIME AS NEEDED FOR SLEEP        Review of Systems:  Review of Systems  Constitutional: Negative for fever, chills, weight loss and malaise/fatigue.  HENT: Negative for congestion.   Eyes: Negative for blurred vision.  Respiratory: Negative for shortness of breath.   Cardiovascular: Negative for chest pain and leg swelling.  Gastrointestinal: Positive for constipation. Negative for abdominal pain, blood in stool and melena.  Genitourinary:       Decreased urinary stream  Musculoskeletal: Negative for myalgias, back pain and falls.  Skin: Negative for rash.  Neurological: Negative for dizziness and weakness.  Psychiatric/Behavioral: Positive for depression. Negative for memory loss. The patient is nervous/anxious and has insomnia.     Health Maintenance  Topic Date Due  . Hepatitis C Screening  [birth year]  . OPHTHALMOLOGY EXAM  06/04/2015  . FOOT EXAM  08/05/2015  . MAMMOGRAM  01/05/2016 (Originally 03/02/2012)  . PNA vac Low Risk Adult (2 of 2 - PPSV23) 01/05/2016  . INFLUENZA VACCINE  05/03/2016  . HEMOGLOBIN A1C  05/26/2016  . TETANUS/TDAP  10/03/2018  . COLONOSCOPY  11/23/2025  . DEXA SCAN  Completed  . ZOSTAVAX  Completed    Physical Exam: Filed Vitals:   11/30/15 1515  BP: 112/72  Pulse: 82  Temp: 98.4 F (36.9 C)  TempSrc: Oral  Height: 4\' 11"  (1.499 m)  Weight: 133 lb (60.328 kg)  SpO2: 95%   Body mass index is 26.85 kg/(m^2). Physical Exam  Constitutional: She is  oriented to person, place, and time. She appears well-developed and well-nourished.  Cardiovascular: Normal rate, regular rhythm, normal heart sounds and intact distal pulses.   Pulmonary/Chest: Effort normal  and breath sounds normal. No respiratory distress.  Abdominal: Soft. Bowel sounds are normal. She exhibits no distension and no mass. There is no tenderness.  Narrow stools since cscope  Musculoskeletal: Normal range of motion. She exhibits no tenderness.  Neurological: She is alert and oriented to person, place, and time.  Skin: Skin is warm and dry.  Psychiatric: She has a normal mood and affect.    Labs reviewed: Basic Metabolic Panel:  Recent Labs  01/05/15 1434 04/07/15 1620 08/26/15 1314 11/27/15 1335  NA  --  140 140 140  K  --  4.1 4.5 4.1  CL  --  103 101 102  CO2  --  24 23 22   GLUCOSE  --  128* 113* 106*  BUN  --  14 17 14   CREATININE  --  0.71 0.70 0.79  CALCIUM  --  9.0 9.2 8.9  TSH 0.817 0.919  --  2.290   Liver Function Tests:  Recent Labs  04/07/15 1620  AST 19  ALT 15  ALKPHOS 55  BILITOT 0.3  PROT 7.5  ALBUMIN 4.2   No results for input(s): LIPASE, AMYLASE in the last 8760 hours. No results for input(s): AMMONIA in the last 8760 hours. CBC:  Recent Labs  04/07/15 1620  WBC 6.6  NEUTROABS 3.7  HCT 36.9  MCV 93  PLT 312   Lipid Panel:  Recent Labs  04/07/15 1620 08/26/15 1314 11/27/15 1335  CHOL 189 151 146  HDL 48 52 46  LDLCALC 108* 76 80  TRIG 165* 114 98  CHOLHDL 3.9 2.9 3.2   Lab Results  Component Value Date   HGBA1C 5.8* 11/27/2015    Assessment/Plan 1. Prediabetes -sugar average has trended down -she is working out more often and working on diet - Microalbumin/Creatinine Ratio, Urine  2. Essential hypertension, benign -bp at goal, cont current meds  3. Hyperlipidemia -at goal, cont same routine  4. Hypothyroidism, unspecified hypothyroidism type TSH wnl, cont same synthroid  5. Insomnia -remains  problematic and worse now due to anxiety about polyps in colon, cont xanax and ambien -she is well aware of risks and is adamant about continuing them regardless  6. Recurrent major depressive disorder, in partial remission ([place]) -cont wellbutrin  7.  Colon polyps: -had 10 sessile polyps removed -she is very worried that she has cancer -looks like they called her and could not leave a message as her voicemail did not have a name -says her stools have narrowed since the cscope  8.  BPPV: -referral to outpatient PT for epley maneuver -also given instruction for exercises to do on her own  Labs/tests ordered:   Orders Placed This Encounter  Procedures  . Microalbumin/Creatinine Ratio, Urine  . Basic metabolic panel    Standing Status: Future     Number of Occurrences:      Standing Expiration Date: 05/29/2016    Order Specific Question:  Has the patient fasted?    Answer:  Yes  . Lipid panel    Standing Status: Future     Number of Occurrences:      Standing Expiration Date: 05/29/2016    Order Specific Question:  Has the patient fasted?    Answer:  Yes  . Ambulatory referral to Physical Therapy    Referral Priority:  Routine    Referral Type:  Physical Medicine    Referral Reason:  Specialty Services Required    Requested Specialty:  Physical Therapy    Number of Visits  Requested:  1   Insurance would not let me recheck her hba1c despite prediabetes  Next appt:  3 mos med mgt, labs before   Arsenio Schnorr L. Willodean Leven, D.O. [facility] [address], [place] [address] Cell Phone (Mon-Fri 8am-5pm):  [phone] On Call:  [phone] & follow prompts after 5pm & weekends Office Phone:  [phone] Office Fax:  [phone]

## 2015-12-01 LAB — MICROALBUMIN / CREATININE URINE RATIO
Creatinine, Urine: 94.1 mg/dL
MICROALB/CREAT RATIO: 4.7 mg/g creat (ref 0.0–30.0)
Microalbumin, Urine: 4.4 ug/mL

## 2015-12-02 LAB — HEPATITIS PANEL, ACUTE
Hep A IgM: NEGATIVE
Hep B C IgM: NEGATIVE
Hep C Virus Ab: <0.1 s/co ratio (ref 0.0–0.9)
Hepatitis B Surface Ag: NEGATIVE

## 2015-12-02 LAB — SPECIMEN STATUS REPORT

## 2015-12-07 DIAGNOSIS — Z1289 Encounter for screening for malignant neoplasm of other sites: Secondary | ICD-10-CM | POA: Diagnosis not present

## 2015-12-14 ENCOUNTER — Ambulatory Visit: Payer: Medicare Other | Admitting: Physical Therapy

## 2015-12-31 ENCOUNTER — Other Ambulatory Visit: Payer: Self-pay | Admitting: Internal Medicine

## 2016-01-02 ENCOUNTER — Other Ambulatory Visit: Payer: Self-pay | Admitting: Internal Medicine

## 2016-01-04 ENCOUNTER — Encounter (HOSPITAL_COMMUNITY): Payer: Self-pay | Admitting: Emergency Medicine

## 2016-01-04 ENCOUNTER — Emergency Department (INDEPENDENT_AMBULATORY_CARE_PROVIDER_SITE_OTHER)
Admission: EM | Admit: 2016-01-04 | Discharge: 2016-01-04 | Disposition: A | Discharge status: Home / Self Care | Payer: Medicare Other | Source: Home / Self Care | Attending: Family Medicine | Admitting: Family Medicine

## 2016-01-04 DIAGNOSIS — G459 Transient cerebral ischemic attack, unspecified: Secondary | ICD-10-CM

## 2016-01-04 DIAGNOSIS — Z8659 Personal history of other mental and behavioral disorders: Secondary | ICD-10-CM | POA: Diagnosis not present

## 2016-01-04 NOTE — ED Notes (Signed)
Incident Saturday:  11:00 pm getting ready for bed.  Reports an episode of trying to turn on a light switch, reports everything black, felt confused about not able to turn on this light switch that was a familiar light switch.  No dizziness.  Depth perception seemed to be off-either too far or too close.  Difficulty pressing light switch when located.  Tried to check blood pressure, but unable to push buttons on the machine that she is familiar with.  Tried to call or text family member and found this task odd.  Daughter did speak to patient on the phone.  Felt odd in her head.  Denies headache.  Reports both hands awkward with dressing as well.  Woke up Sunday feeling fine.  pcp could not see her till Thursday.  Told patient to come to ucc.  Denies symptoms today and no history of this before

## 2016-01-04 NOTE — Discharge Instructions (Signed)
YOU NEED TO START TODAY WITH 4 BABY ASPRIN AND THEN TAKE 1 EACH DAY UNTIL YOU ARE DIRECTED TO STOP BY YOUR DOCTOR IF YOU HAVE NEW OR SIMILAR SYMPTOMS YOU SHOULD CALL 911.  Confusion Confusion is the inability to think with your usual speed or clarity. Confusion may come on quickly or slowly over time. How quickly the confusion comes on depends on the cause. Confusion can be due to any number of causes. CAUSES   Concussion, head injury, or head trauma.  Seizures.  Stroke.  Fever.  Brain tumor.  Age related decreased brain function (dementia).  Heightened emotional states like rage or terror.  Mental illness in which the person loses the ability to determine what is real and what is not (hallucinations).  Infections such as a urinary tract infection (UTI).  Toxic effects from alcohol, drugs, or prescription medicines.  Dehydration and an imbalance of salts in the body (electrolytes).  Lack of sleep.  Low blood sugar (diabetes).  Low levels of oxygen from conditions such as chronic lung disorders.  Drug interactions or other medicine side effects.  Nutritional deficiencies, especially niacin, thiamine, vitamin C, or vitamin B.  Sudden drop in body temperature (hypothermia).  Change in routine, such as when traveling or hospitalized. SIGNS AND SYMPTOMS  People often describe their thinking as cloudy or unclear when they are confused. Confusion can also include feeling disoriented. That means you are unaware of where or who you are. You may also not know what the date or time is. If confused, you may also have difficulty paying attention, remembering, and making decisions. Some people also act aggressively when they are confused.  DIAGNOSIS  The medical evaluation of confusion may include:  Blood and urine tests.  X-rays.  Brain and nervous system tests.  Analyzing your brain waves (electroencephalogram or EEG).  Magnetic resonance imaging (MRI) of your  head.  Computed tomography (CT) scan of your head.  Mental status tests in which your health care provider may ask many questions. Some of these questions may seem silly or strange, but they are a very important test to help diagnose and treat confusion. TREATMENT  An admission to the hospital may not be needed, but a person with confusion should not be left alone. Stay with a family member or friend until the confusion clears. Avoid alcohol, pain relievers, or sedative drugs until you have fully recovered. Do not drive until directed by your health care provider. HOME CARE INSTRUCTIONS  What family and friends can do:  To find out if someone is confused, ask the person to state his or her name, age, and the date. If the person is unsure or answers incorrectly, he or she is confused.  Always introduce yourself, no matter how well the person knows you.  Often remind the person of his or her location.  Place a calendar and clock near the confused person.  Help the person with his or her medicines. You may want to use a pill box, an alarm as a reminder, or give the person each dose as prescribed.  Talk about current events and plans for the day.  Try to keep the environment calm, quiet, and peaceful.  Make sure the person keeps follow-up visits with his or her health care provider. PREVENTION  Ways to prevent confusion:  Avoid alcohol.  Eat a balanced diet.  Get enough sleep.  Take medicine only as directed by your health care provider.  Do not become isolated. Spend time with other  people and make plans for your days.  Keep careful watch on your blood sugar levels if you are diabetic. SEEK IMMEDIATE MEDICAL CARE IF:   You develop severe headaches, repeated vomiting, seizures, blackouts, or slurred speech.  There is increasing confusion, weakness, numbness, restlessness, or personality changes.  You develop a loss of balance, have marked dizziness, feel uncoordinated, or  fall.  You have delusions, hallucinations, or develop severe anxiety.  Your family members think you need to be rechecked.   This information is not intended to replace advice given to you by your health care provider. Make sure you discuss any questions you have with your health care provider.   Document Released: 10/27/2004 Document Revised: 10/10/2014 Document Reviewed: 10/25/2013 Elsevier Interactive Patient Education 2016 [employer] Transient Ischemic Attack A transient ischemic attack (TIA) is a "warning stroke" that causes stroke-like symptoms. A TIA does not cause lasting damage to the brain. The symptoms of a TIA can happen fast and do not last long. It is important to know the symptoms of a TIA and what to do. This can help prevent stroke or death.  HOME CARE   Take medicines only as told by your doctor. Make sure you understand all of the instructions.  You may need to take aspirin or warfarin medicine. Warfarin needs to be taken exactly as told.  Taking too much or too little warfarin is dangerous. Blood tests must be done as often as told by your doctor. A PT blood test measures how long it takes for blood to clot. Your PT is used to calculate another value called an INR. Your PT and INR help your doctor adjust your warfarin dosage. He or she will make sure you are taking the right amount.  Food can cause problems with warfarin and affect the results of your blood tests. This is true for foods high in vitamin K. Eat the same amount of foods high in vitamin K each day. Foods high in vitamin K include spinach, kale, broccoli, cabbage, collard and turnip greens, Brussels sprouts, peas, cauliflower, seaweed, and parsley. Other foods high in vitamin K include beef and pork liver, green tea, and soybean oil. Eat the same amount of foods high in vitamin K each day. Avoid big changes in your diet. Tell your doctor before changing your diet. Talk to a food specialist (dietitian) if you  have questions.  Many medicines can cause problems with warfarin and affect your PT and INR. Tell your doctor about all medicines you take. This includes vitamins and dietary pills (supplements). Do not take or stop taking any prescribed or over-the-counter medicines unless your doctor tells you to.  Warfarin can cause more bruising or bleeding. Hold pressure over any cuts for longer than normal. Talk to your doctor about other side effects of warfarin.  Avoid sports or activities that may cause injury or bleeding.  Be careful when you shave, floss, or use sharp objects.  Avoid or drink very little alcohol while taking warfarin. Tell your doctor if you change how much alcohol you drink.  Tell your dentist and other doctors that you take warfarin before any procedures.  Follow your diet program as told, if you are given one.  Keep a healthy weight.  Stay active. Try to get at least 30 minutes of activity on all or most days.  Do not use any tobacco products, including cigarettes, chewing tobacco, or electronic cigarettes. If you need help quitting, ask your doctor.  Limit alcohol intake to  no more than 1 drink per day for nonpregnant women and 2 drinks per day for men. One drink equals 12 ounces of beer, 5 ounces of wine, or 1 ounces of hard liquor.  Do not abuse drugs.  Keep your home safe so you do not fall. You can do this by:  Putting grab bars in the bedroom and bathroom.  Raising toilet seats.  Putting a seat in the shower.  Keep all follow-up visits as told by your doctor. This is important. GET HELP IF:  Your personality changes.  You have trouble swallowing.  You have double vision.  You are dizzy.  You have a fever. GET HELP RIGHT AWAY IF:  These symptoms may be an emergency. Do not wait to see if the symptoms will go away. Get medical help right away. Call your local emergency services (911 in the [place] Do not drive yourself to the hospital.  You have  sudden weakness or lose feeling (go numb), especially on one side of the body. This can affect your:  Face.  Arm.  Leg.  You have sudden trouble walking.  You have sudden trouble moving your arms or legs.  You have sudden confusion.  You have trouble talking.  You have trouble understanding.  You have sudden trouble seeing in one or both eyes.  You lose your balance.  Your movements are not smooth.  You have a sudden, very bad headache with no known cause.  You have new chest pain.  Your heartbeat is unsteady.  You are partly or totally unaware of what is going on around you. MAKE SURE YOU:   Understand these instructions.  Will watch your condition.  Will get help right away if you are not doing well or get worse.   This information is not intended to replace advice given to you by your health care provider. Make sure you discuss any questions you have with your health care provider.   Document Released: 06/28/2008 Document Revised: 10/10/2014 Document Reviewed: 12/25/2013 Elsevier Interactive Patient Education [employer].

## 2016-01-04 NOTE — ED Provider Notes (Signed)
CSN: [id number]     Arrival date & time 01/04/16  1303 History   First MD Initiated Contact with Patient 01/04/16 1430     Chief Complaint  Patient presents with  . Altered Mental Status   (Consider location/radiation/quality/duration/timing/severity/associated sxs/prior Treatment) HPI Pt states that she was preparing for bed, and tried to turn on light switch and could not remember how to do it. She also also states that she was having trouble using the phone, and the tv remote.  She also had difficulty getting into a night shirt. She was able to check her sugar, unable to figure out how to use her BP machine. States she finally was able to call her daughter. Was able to make it to bed and awoke Sunday feeling well.  She also describes weakness in both hands.  Past Medical History  Diagnosis Date  . Diverticulosis   . Sigmoid polyp   . Colon polyp     Tubular Adenoma  . Colon polyp, hyperplastic   . Internal hemorrhoid   . Thyroid disease   . Hyperlipidemia   . Anxiety   . Hypertension   . Hypertonicity of bladder   . Insomnia, unspecified   . Flushing   . Other abnormal blood chemistry   . DDD (degenerative disc disease), lumbar   . Cataract     BILATERAL REMOVAL  . Chronic kidney disease     KIDNEY STONES 2 TIMES IN 20 YEARS    Past Surgical History  Procedure Laterality Date  . Ectopic pregnancy surgery  1965  . Breast enhancement surgery  1969    has bilateral silicone implants  . Cholecystectomy    . Rotator cuff repair  2013  . Colonoscopy  11/24/2015    Dr. Hilarie Fredrickson  . Polypectomy     Family History  Problem Relation Age of Onset  . Cancer Mother     lung  . Colon polyps Mother   . Stroke Father   . Colon cancer Neg Hx   . Rectal cancer Neg Hx   . Stomach cancer Neg Hx   . Esophageal cancer Neg Hx    Social History  Substance Use Topics  . Smoking status: Former Smoker    Quit date: 11/30/1995  . Smokeless tobacco: Never Used  . Alcohol Use: No   OB  History    No data available     Review of Systems Episode of confusion Allergies  Latex  Home Medications   Prior to Admission medications   Medication Sig Start Date End Date Taking? Authorizing Provider  ALPRAZolam Duanne Moron) 1 MG tablet TAKE TWO TABLETS BY MOUTH AT BEDTIME AS NEEDED FOR ANXIETY 12/31/15   Tiffany L Reed, DO  buPROPion (WELLBUTRIN XL) 300 MG 24 hr tablet Take 1 tablet (300 mg total) by mouth daily. 05/25/15   Tiffany L Reed, DO  estradiol (ESTRACE) 1 MG tablet Take 1 mg by mouth daily.    Historical Provider, MD  glucose blood (FREESTYLE LITE) test strip Use as instructed to check blood sugar daily. Dx: R73.09 05/25/15   Tiffany L Reed, DO  levothyroxine (SYNTHROID, LEVOTHROID) 88 MCG tablet TAKE ONE TABLET BY MOUTH ONCE DAILY 01/04/16   Tiffany L Reed, DO  losartan (COZAAR) 25 MG tablet Take 1 tablet (25 mg total) by mouth daily. 09/07/15   Tiffany L Reed, DO  medroxyPROGESTERone (PROVERA) 5 MG tablet Take 5 mg by mouth daily.    Historical Provider, MD  polyethylene glycol powder (GLYCOLAX/MIRALAX) powder TAKE  ONE CAPFUL BY MOUTH TWICE DAILY AS NEEDED 10/13/15   Tiffany L Reed, DO  simvastatin (ZOCOR) 40 MG tablet TAKE ONE TABLET BY MOUTH ONCE DAILY 11/23/15   Tiffany L Reed, DO  zolpidem (AMBIEN) 5 MG tablet TAKE ONE TABLET BY MOUTH AT BEDTIME AS NEEDED FOR SLEEP 10/19/15   Tiffany L Reed, DO   Meds Ordered and Administered this Visit  Medications - No data to display  BP 120/74 mmHg  Pulse 78  Temp(Src) 99.1 F (37.3 C) (Oral)  Resp 16  SpO2 99% No data found.   Physical Exam NURSES NOTES AND VITAL SIGNS REVIEWED. CONSTITUTIONAL: Well developed, well nourished, no acute distress HEENT: normocephalic, atraumatic EYES: Conjunctiva normal, PERRL, EOM-I NECK:normal ROM, supple, no adenopathy PULMONARY:No respiratory distress, normal effort, LUNGS CTA b/l MUSCULOSKELETAL: Normal ROM of all extremities,  SKIN: warm and dry without rash PSYCHIATRIC: Mood and affect,  behavior are normal NEUROLOGIC: FUND exam normal, CN2-8 intact. Grip strength equal bilaterally  ED Course  Procedures (including critical care time)  Labs Review Labs Reviewed - No data to display  Imaging Review No results found.   Visual Acuity Review  Right Eye Distance:   Left Eye Distance:   Bilateral Distance:    Right Eye Near:   Left Eye Near:    Bilateral Near:      Discussed case with Dr. Mariea Clonts.  She has concerns that pt may have misused some of her medications. She also would like a baby aspirin started.  She will see patient in the office Thursday at 330.   MDM   1. Transient cerebral ischemia, unspecified transient cerebral ischemia type     Patient is reassured that there are no issues that require transfer to higher level of care at this time or additional tests. Patient is advised to continue home symptomatic treatment. Patient is advised that if there are new or worsening symptoms to attend the emergency department, contact primary care provider, or return to UC. Instructions of care provided discharged home in stable condition.    THIS NOTE WAS GENERATED USING A VOICE RECOGNITION SOFTWARE PROGRAM. ALL REASONABLE EFFORTS  WERE MADE TO PROOFREAD THIS DOCUMENT FOR ACCURACY.  I have verbally reviewed the discharge instructions with the patient. A printed AVS was given to the patient.  All questions were answered prior to discharge.      Konrad Felix, [place] 01/04/16 [phone]

## 2016-01-04 NOTE — ED Notes (Deleted)
Unknown tetanus status, patient calling office for tetanus.

## 2016-01-07 ENCOUNTER — Encounter: Payer: Self-pay | Admitting: Internal Medicine

## 2016-01-07 ENCOUNTER — Ambulatory Visit (INDEPENDENT_AMBULATORY_CARE_PROVIDER_SITE_OTHER): Payer: Medicare Other | Admitting: Internal Medicine

## 2016-01-07 VITALS — BP 110/68 | HR 87 | Temp 98.4°F | Resp 20 | Ht 59.0 in | Wt 132.0 lb

## 2016-01-07 DIAGNOSIS — R2990 Unspecified symptoms and signs involving the nervous system: Secondary | ICD-10-CM

## 2016-01-07 DIAGNOSIS — R7303 Prediabetes: Secondary | ICD-10-CM

## 2016-01-07 DIAGNOSIS — M6289 Other specified disorders of muscle: Secondary | ICD-10-CM | POA: Diagnosis not present

## 2016-01-07 DIAGNOSIS — E785 Hyperlipidemia, unspecified: Secondary | ICD-10-CM

## 2016-01-07 DIAGNOSIS — F418 Other specified anxiety disorders: Secondary | ICD-10-CM | POA: Diagnosis not present

## 2016-01-07 DIAGNOSIS — I10 Essential (primary) hypertension: Secondary | ICD-10-CM | POA: Diagnosis not present

## 2016-01-07 DIAGNOSIS — F32A Depression, unspecified: Secondary | ICD-10-CM

## 2016-01-07 DIAGNOSIS — F329 Major depressive disorder, single episode, unspecified: Secondary | ICD-10-CM

## 2016-01-07 DIAGNOSIS — R29898 Other symptoms and signs involving the musculoskeletal system: Secondary | ICD-10-CM

## 2016-01-07 DIAGNOSIS — F419 Anxiety disorder, unspecified: Secondary | ICD-10-CM

## 2016-01-07 MED ORDER — ASPIRIN EC 81 MG PO TBEC
81.0000 mg | DELAYED_RELEASE_TABLET | Freq: Every day | ORAL | Status: DC
Start: 2016-01-07 — End: 2020-05-14

## 2016-01-07 MED ORDER — GLUCOSE BLOOD VI STRP: ORAL_STRIP | Status: DC

## 2016-01-07 NOTE — Progress Notes (Signed)
Patient ID: Kelli [name], female   DOB: [birth year], 70 y.o.   MRN: [id number]   Location:  [facility] clinic Provider: Chritopher Coster L. Mariea Clonts, D.O., C.M.D.  Code Status: full code Goals of Care:  Advanced Directives 01/07/2016  Does patient have an advance directive? No  Would patient like information on creating an advanced directive? No - patient declined information   Chief Complaint  Patient presents with  . Hospitalization Follow-up    urgent care visit - ?TIA    HPI: Patient is a 70 y.o. female seen today for hospital follow-up s/p urgent care visit for possible TIA.  10pm was quite stressed and unloaded those after talking with her daughter.  1120pm, getting ready for bed in small area and went to turn light switch on.  Seemed like it was darker in the area than it was supposed to be--could not reach the lightswitch no matter what.  Walked away from there.  Felt like her depth perception was off.  Checked glucose and it was ok. Couldn't get her bp meter to make it read.  Couldn't get it back in its case either.  Felt lightheaded.  Then woke up in the middle of the night.  Could not figure out how to put her shirt on--took more than 5 mins to figure out.  Reports symptoms lasted for 45 mins.   Pt didn't call 911 b/c she didn't want her daughter to drive all the way from [place].  Says she knows she should have called 911.   No headache.  She did not take anymore of her xanax, ambien than prescribed.    Every once in a while, this happens.  After eating a banana, a pear, a slim fast and a yogurt, she was still hungry and shaky.  CBG was 112.  Also had wheat english muffin.  Discussed having a complex carb item to get glucose up when symptomatic like that and some protein.    Is taking the aspirin 81mg .  Past Medical History  Diagnosis Date  . Diverticulosis   . Sigmoid polyp   . Colon polyp     Tubular Adenoma  . Colon polyp, hyperplastic   . Internal hemorrhoid   . Thyroid disease   .  Hyperlipidemia   . Anxiety   . Hypertension   . Hypertonicity of bladder   . Insomnia, unspecified   . Flushing   . Other abnormal blood chemistry   . DDD (degenerative disc disease), lumbar   . Cataract     BILATERAL REMOVAL  . Chronic kidney disease     KIDNEY STONES 2 TIMES IN 20 YEARS     Past Surgical History  Procedure Laterality Date  . Ectopic pregnancy surgery  1965  . Breast enhancement surgery  1969    has bilateral silicone implants  . Cholecystectomy    . Rotator cuff repair  2013  . Colonoscopy  11/24/2015    Dr. Hilarie Fredrickson  . Polypectomy      Allergies  Allergen Reactions  . Latex Swelling      Medication List       This list is accurate as of: 01/07/16  3:37 PM.  Always use your most recent med list.               ALPRAZolam 1 MG tablet  Commonly known as:  XANAX  TAKE TWO TABLETS BY MOUTH AT BEDTIME AS NEEDED FOR ANXIETY     buPROPion 300 MG 24 hr tablet  Commonly  known as:  WELLBUTRIN XL  Take 1 tablet (300 mg total) by mouth daily.     estradiol 1 MG tablet  Commonly known as:  ESTRACE  Take 1 mg by mouth daily.     glucose blood test strip  Commonly known as:  FREESTYLE LITE  Use as instructed to check blood sugar daily. Dx: R73.09     levothyroxine 88 MCG tablet  Commonly known as:  SYNTHROID, LEVOTHROID  TAKE ONE TABLET BY MOUTH ONCE DAILY     losartan 25 MG tablet  Commonly known as:  COZAAR  Take 1 tablet (25 mg total) by mouth daily.     medroxyPROGESTERone 5 MG tablet  Commonly known as:  PROVERA  Take 5 mg by mouth daily.     polyethylene glycol powder powder  Commonly known as:  GLYCOLAX/MIRALAX  TAKE ONE CAPFUL BY MOUTH TWICE DAILY AS NEEDED     simvastatin 40 MG tablet  Commonly known as:  ZOCOR  TAKE ONE TABLET BY MOUTH ONCE DAILY     zolpidem 5 MG tablet  Commonly known as:  AMBIEN  TAKE ONE TABLET BY MOUTH AT BEDTIME AS NEEDED FOR SLEEP        Review of Systems: see hpi for rest Review of Systems    Constitutional: Negative for fever and chills.  Respiratory: Negative for shortness of breath.   Cardiovascular: Negative for chest pain, palpitations and leg swelling.  Musculoskeletal: Negative for falls.  Psychiatric/Behavioral: Positive for depression. Negative for memory loss. The patient is nervous/anxious and has insomnia.     Health Maintenance  Topic Date Due  . MAMMOGRAM  03/02/2012  . OPHTHALMOLOGY EXAM  06/04/2015  . FOOT EXAM  08/05/2015  . PNA vac Low Risk Adult (2 of 2 - PPSV23) 01/05/2016  . INFLUENZA VACCINE  05/03/2016  . HEMOGLOBIN A1C  05/26/2016  . TETANUS/TDAP  10/03/2018  . COLONOSCOPY  11/23/2018  . DEXA SCAN  Completed  . ZOSTAVAX  Completed  . Hepatitis C Screening  Completed    Physical Exam: Filed Vitals:   01/07/16 1503  BP: 110/68  Pulse: 87  Temp: 98.4 F (36.9 C)  TempSrc: Oral  Resp: 20  Height: 4\' 11"  (1.499 m)  Weight: 132 lb (59.875 kg)  SpO2: 98%   Body mass index is 26.65 kg/(m^2). Physical Exam  Constitutional: She is oriented to person, place, and time. She appears well-developed and well-nourished. No distress.  Cardiovascular: Normal rate, regular rhythm, normal heart sounds and intact distal pulses.   Pulmonary/Chest: Effort normal and breath sounds normal. No respiratory distress.  Abdominal: Soft. Bowel sounds are normal.  Musculoskeletal: Normal range of motion.  Neurological: She is alert and oriented to person, place, and time.  Skin: Skin is warm and dry.  Psychiatric: She has a normal mood and affect.    Labs reviewed: Basic Metabolic Panel:  Recent Labs  04/07/15 1620 08/26/15 1314 11/27/15 1335  NA 140 140 140  K 4.1 4.5 4.1  CL 103 101 102  CO2 24 23 22   GLUCOSE 128* 113* 106*  BUN 14 17 14   CREATININE 0.71 0.70 0.79  CALCIUM 9.0 9.2 8.9  TSH 0.919  --  2.290   Liver Function Tests:  Recent Labs  04/07/15 1620  AST 19  ALT 15  ALKPHOS 55  BILITOT 0.3  PROT 7.5  ALBUMIN 4.2   No results  for input(s): LIPASE, AMYLASE in the last 8760 hours. No results for input(s): AMMONIA in the last  8760 hours. CBC:  Recent Labs  04/07/15 1620  WBC 6.6  NEUTROABS 3.7  HCT 36.9  MCV 93  PLT 312   Lipid Panel:  Recent Labs  04/07/15 1620 08/26/15 1314 11/27/15 1335  CHOL 189 151 146  HDL 48 52 46  LDLCALC 108* 76 80  TRIG 165* 114 98  CHOLHDL 3.9 2.9 3.2   Lab Results  Component Value Date   HGBA1C 5.8* 11/27/2015   Assessment/Plan 1. Stroke-like symptoms - I don't highly suspect stroke or TIA, but pt very concerned and urgent care physician was also so will r/o with CT and carotid dopplers -suspect from mix of her xanax and ambien  - aspirin EC 81 MG tablet; Take 1 tablet (81 mg total) by mouth daily.  Dispense: 30 tablet; Refill: 11 - CT Head Wo Contrast; Future - US Carotid Bilateral; Future  2. Essential hypertension, benign - bp at goal with losartan - CT Head Wo Contrast; Future  3. Hyperlipidemia -cont to work on diet and exercise, cont baby asa, zccor - CT Head Wo Contrast; Future  4. Prediabetes - cont to work on exercise and deit - aspirin EC 81 MG tablet; Take 1 tablet (81 mg total) by mouth daily.  Dispense: 30 tablet; Refill: 11 - CT Head Wo Contrast; Future  5. Anxiety and depression -suspect she had a reaction to her medications again but she denies having taken more of her ambien or xanax which I have advised she should not take at the same time  6. Weakness of hand -seems to have no residual weakness - CT Head Wo Contrast; Future  Labs/tests ordered:   Orders Placed This Encounter  Procedures  . CT Head Wo Contrast    132 LBS/NO NEEDS/INS/[facility] [place]/RLC/PT W/EPIC ORDER    Standing Status: Future     Number of Occurrences: 1     Standing Expiration Date: 04/08/2017    Order Specific Question:  Reason for Exam (SYMPTOM  OR DIAGNOSIS REQUIRED)    Answer:  change in mental status, visual changes    Order Specific Question:   Preferred imaging location?    Answer:  [facility]  . US Carotid Bilateral    [facility] Epic order    Standing Status: Future     Number of Occurrences: 1     Standing Expiration Date: 03/08/2017    Order Specific Question:  Preferred imaging location?    Answer:  [facility]   Next appt:  02/22/2016  Eual Lindstrom L. Aaryanna Hyden, D.O. [facility] [address],  [address] Cell Phone (Mon-Fri 8am-5pm):  [phone] On Call:  [phone] & follow prompts after 5pm & weekends Office Phone:  [phone] Office Fax:  [phone]

## 2016-01-15 ENCOUNTER — Ambulatory Visit
Admission: RE | Admit: 2016-01-15 | Discharge: 2016-01-15 | Disposition: A | Discharge status: Home / Self Care | Payer: Medicare Other | Source: Ambulatory Visit | Attending: Internal Medicine | Admitting: Internal Medicine

## 2016-01-15 DIAGNOSIS — R29898 Other symptoms and signs involving the musculoskeletal system: Secondary | ICD-10-CM

## 2016-01-15 DIAGNOSIS — E785 Hyperlipidemia, unspecified: Secondary | ICD-10-CM

## 2016-01-15 DIAGNOSIS — R2990 Unspecified symptoms and signs involving the nervous system: Secondary | ICD-10-CM

## 2016-01-15 DIAGNOSIS — R42 Dizziness and giddiness: Secondary | ICD-10-CM | POA: Diagnosis not present

## 2016-01-15 DIAGNOSIS — I6523 Occlusion and stenosis of bilateral carotid arteries: Secondary | ICD-10-CM | POA: Diagnosis not present

## 2016-01-15 DIAGNOSIS — R7303 Prediabetes: Secondary | ICD-10-CM

## 2016-01-15 DIAGNOSIS — I10 Essential (primary) hypertension: Secondary | ICD-10-CM

## 2016-01-18 ENCOUNTER — Encounter: Payer: Self-pay | Admitting: *Deleted

## 2016-01-19 ENCOUNTER — Other Ambulatory Visit: Payer: Self-pay | Admitting: Internal Medicine

## 2016-01-31 ENCOUNTER — Other Ambulatory Visit: Payer: Self-pay | Admitting: Internal Medicine

## 2016-02-01 ENCOUNTER — Ambulatory Visit (HOSPITAL_COMMUNITY)
Admission: EM | Admit: 2016-02-01 | Discharge: 2016-02-01 | Disposition: A | Discharge status: Home / Self Care | Payer: Medicare Other | Attending: Emergency Medicine | Admitting: Emergency Medicine

## 2016-02-01 ENCOUNTER — Encounter (HOSPITAL_COMMUNITY): Payer: Self-pay | Admitting: Emergency Medicine

## 2016-02-01 DIAGNOSIS — S90222A Contusion of left lesser toe(s) with damage to nail, initial encounter: Secondary | ICD-10-CM | POA: Diagnosis not present

## 2016-02-01 NOTE — ED Notes (Signed)
Patient's toe dressed with bacitracin, nonadherent bandage and coban wrap.

## 2016-02-01 NOTE — ED Provider Notes (Signed)
HPI  SUBJECTIVE:  Kelli [name] is a 70 y.o. female who presents with purplish discoloration underneath the toenail of her left for toe starting last night. Patient states she had an ingrown toenail that she has been picking at. She reports mild intermittent throbbing seconds long pain with palpation. She is able to walk well with this. She denies any discoloration the toe itself. There are no aggravating or alleviating factors. She has tried soaking and absence out. No numbness, tingling,discharge, drainage, erythema of the toe, or other known trauma to the nail. Patient does take aspirin. She has a history of prediabetes, hypertension, hypercholesterolemia, hypothyroidism. Questionable TIA.She is not on any other antiplatelet or anticoagulant drugs, no history of bruising easily. PMD: Dr. Hollace Kinnier.    Past Medical History  Diagnosis Date  . Diverticulosis   . Sigmoid polyp   . Colon polyp     Tubular Adenoma  . Colon polyp, hyperplastic   . Internal hemorrhoid   . Thyroid disease   . Hyperlipidemia   . Anxiety   . Hypertension   . Hypertonicity of bladder   . Insomnia, unspecified   . Flushing   . Other abnormal blood chemistry   . DDD (degenerative disc disease), lumbar   . Cataract     BILATERAL REMOVAL  . Chronic kidney disease     KIDNEY STONES 2 TIMES IN 20 YEARS     Past Surgical History  Procedure Laterality Date  . Ectopic pregnancy surgery  1965  . Breast enhancement surgery  1969    has bilateral silicone implants  . Cholecystectomy    . Rotator cuff repair  2013  . Colonoscopy  11/24/2015    Dr. Hilarie Fredrickson  . Polypectomy      Family History  Problem Relation Age of Onset  . Cancer Mother     lung  . Colon polyps Mother   . Stroke Father   . Colon cancer Neg Hx   . Rectal cancer Neg Hx   . Stomach cancer Neg Hx   . Esophageal cancer Neg Hx     Social History  Substance Use Topics  . Smoking status: Former Smoker    Quit date: 11/30/1995  .  Smokeless tobacco: Never Used  . Alcohol Use: No    No current facility-administered medications for this encounter.  Current outpatient prescriptions:  .  ALPRAZolam (XANAX) 1 MG tablet, TAKE TWO TABLETS BY MOUTH AT BEDTIME AS NEEDED FOR ANXIETY, Disp: 60 tablet, Rfl: 0 .  aspirin EC 81 MG tablet, Take 1 tablet (81 mg total) by mouth daily., Disp: 30 tablet, Rfl: 11 .  buPROPion (WELLBUTRIN XL) 300 MG 24 hr tablet, Take 1 tablet (300 mg total) by mouth daily., Disp: 30 tablet, Rfl: 3 .  estradiol (ESTRACE) 1 MG tablet, Take 1 mg by mouth daily., Disp: , Rfl:  .  glucose blood (RELION GLUCOSE TEST STRIPS) test strip, Use as instructed, Disp: 50 each, Rfl: 12 .  levothyroxine (SYNTHROID, LEVOTHROID) 88 MCG tablet, TAKE ONE TABLET BY MOUTH ONCE DAILY, Disp: 30 tablet, Rfl: 0 .  losartan (COZAAR) 25 MG tablet, TAKE ONE TABLET BY MOUTH ONCE DAILY, Disp: 30 tablet, Rfl: 3 .  medroxyPROGESTERone (PROVERA) 5 MG tablet, Take 5 mg by mouth daily., Disp: , Rfl:  .  polyethylene glycol powder (GLYCOLAX/MIRALAX) powder, TAKE ONE CAPFUL BY MOUTH TWICE DAILY AS NEEDED, Disp: 765 g, Rfl: 2 .  simvastatin (ZOCOR) 40 MG tablet, TAKE ONE TABLET BY MOUTH ONCE DAILY, Disp:  90 tablet, Rfl: 0 .  zolpidem (AMBIEN) 5 MG tablet, TAKE ONE TABLET BY MOUTH AT BEDTIME AS NEEDED FOR SLEEP, Disp: 30 tablet, Rfl: 0  Allergies  Allergen Reactions  . Latex Swelling     ROS  As noted in HPI.   Physical Exam  BP 127/65 mmHg  Pulse 79  Temp(Src) 98.2 F (36.8 C) (Oral)  Resp 18  SpO2 97%  Constitutional: Well developed, well nourished, no acute distress Eyes:  EOMI, conjunctiva normal bilaterally HENT: Normocephalic, atraumatic,mucus membranes moist Respiratory: Normal inspiratory effort Cardiovascular: Normal rate GI: nondistended skin: No rash, skin intact Musculoskeletal: large subungual hematoma left first toenail, no surrounding tissue erythema, swelling, tenderness. Sensation grossly intact distally.  No bony tenderness. No expressible purulent drainage. Neurologic: Alert & oriented x 3, no focal neuro deficits Psychiatric: Speech and behavior appropriate   ED Course   Medications - No data to display  No orders of the defined types were placed in this encounter.    No results found for this or any previous visit (from the past 24 hour(s)). No results found.  ED Clinical Impression  Subungual hematoma of toe of left foot, initial encounter   ED Assessment/Plan  Procedure note: cleaned nail with alcohol, using cautery , trephinated the nail with serosanguineous drainage. Unable to completely evacuate hematoma. Placed bacitracin and pressure dressing on the area afterwards. Patient tolerated procedure well.  Pt to continue localized wound care, Tylenol or ibuprofen as needed, watch for signs of infection. Follow-up with primary care physician as needed, may return here for any signs of infection. Discussed MDM, plan and followup with patient . Discussed sn/sx that should prompt return to the UC or  ED. Patient agrees with plan.    *This clinic note was created using Dragon dictation software. Therefore, there may be occasional mistakes despite careful proofreading.  ?   Melynda Ripple, MD 02/02/16 1210

## 2016-02-01 NOTE — ED Notes (Signed)
The patient presented to the [facility] with a complaint of pain to her left great toe. She stated that she hd an ingrown toe nail and she stated that after she cut on it she noticed that her nail became blue and swollen.

## 2016-02-05 ENCOUNTER — Other Ambulatory Visit: Payer: Self-pay

## 2016-02-05 ENCOUNTER — Other Ambulatory Visit: Payer: Self-pay | Admitting: Internal Medicine

## 2016-02-05 MED ORDER — SIMVASTATIN 40 MG PO TABS: ORAL_TABLET | ORAL | Status: DC

## 2016-02-05 NOTE — Telephone Encounter (Signed)
[facility] fax request

## 2016-02-11 ENCOUNTER — Other Ambulatory Visit: Payer: Self-pay | Admitting: Internal Medicine

## 2016-02-12 ENCOUNTER — Ambulatory Visit: Payer: Medicare Other | Admitting: Podiatry

## 2016-02-12 ENCOUNTER — Other Ambulatory Visit: Payer: Self-pay | Admitting: Internal Medicine

## 2016-02-13 ENCOUNTER — Other Ambulatory Visit: Payer: Self-pay | Admitting: Internal Medicine

## 2016-02-19 ENCOUNTER — Other Ambulatory Visit: Payer: Self-pay | Admitting: *Deleted

## 2016-02-19 MED ORDER — GLUCOSE BLOOD VI STRP: ORAL_STRIP | Status: DC

## 2016-02-19 NOTE — Telephone Encounter (Signed)
Patient requested to call in One Touch Verio Test Strips.

## 2016-02-22 ENCOUNTER — Other Ambulatory Visit: Payer: Medicare Other

## 2016-02-22 DIAGNOSIS — E785 Hyperlipidemia, unspecified: Secondary | ICD-10-CM

## 2016-02-22 DIAGNOSIS — I10 Essential (primary) hypertension: Secondary | ICD-10-CM

## 2016-02-23 LAB — BASIC METABOLIC PANEL
BUN/Creatinine Ratio: 17 (ref 12–28)
BUN: 15 mg/dL (ref 8–27)
CO2: 23 mmol/L (ref 18–29)
Calcium: 9.2 mg/dL (ref 8.7–10.3)
Chloride: 100 mmol/L (ref 96–106)
Creatinine, Ser: 0.86 mg/dL (ref 0.57–1.00)
GFR calc Af Amer: 78 mL/min/{1.73_m2} (ref >59)
GFR calc non Af Amer: 68 mL/min/{1.73_m2} (ref >59)
Glucose: 100 mg/dL — ABNORMAL HIGH (ref 65–99)
Potassium: 3.9 mmol/L (ref 3.5–5.2)
Sodium: 139 mmol/L (ref 134–144)

## 2016-02-23 LAB — LIPID PANEL
Chol/HDL Ratio: 3.7 ratio units (ref 0.0–4.4)
Cholesterol, Total: 194 mg/dL (ref 100–199)
HDL: 52 mg/dL (ref >39)
LDL Calculated: 105 mg/dL — ABNORMAL HIGH (ref 0–99)
Triglycerides: 185 mg/dL — ABNORMAL HIGH (ref 0–149)
VLDL Cholesterol Cal: 37 mg/dL (ref 5–40)

## 2016-02-25 ENCOUNTER — Encounter: Payer: Self-pay | Admitting: Internal Medicine

## 2016-02-25 ENCOUNTER — Ambulatory Visit (INDEPENDENT_AMBULATORY_CARE_PROVIDER_SITE_OTHER): Payer: Medicare Other | Admitting: Internal Medicine

## 2016-02-25 VITALS — BP 112/62 | HR 75 | Temp 98.0°F | Ht 59.0 in | Wt 133.0 lb

## 2016-02-25 DIAGNOSIS — R7303 Prediabetes: Secondary | ICD-10-CM

## 2016-02-25 DIAGNOSIS — I10 Essential (primary) hypertension: Secondary | ICD-10-CM

## 2016-02-25 DIAGNOSIS — E039 Hypothyroidism, unspecified: Secondary | ICD-10-CM

## 2016-02-25 DIAGNOSIS — F3341 Major depressive disorder, recurrent, in partial remission: Secondary | ICD-10-CM

## 2016-02-25 DIAGNOSIS — G4700 Insomnia, unspecified: Secondary | ICD-10-CM | POA: Diagnosis not present

## 2016-02-25 DIAGNOSIS — E785 Hyperlipidemia, unspecified: Secondary | ICD-10-CM

## 2016-02-25 NOTE — Progress Notes (Signed)
Location:  [facility] clinic Provider:  Mireyah Chervenak L. Mariea Clonts, D.O., C.M.D.  Code Status: DNR Goals of Care:  Advanced Directives 02/25/2016  Does patient have an advance directive? No  Would patient like information on creating an advanced directive? -   Chief Complaint  Patient presents with  . Medical Management of Chronic Issues    29mth follow-up    HPI: Patient is a 70 y.o. female seen today for medical management of chronic diseases.    Had been picking at ingrown toenail on lateral aspect of her left great toenail.  Wore flip flops the next day with no problem.  That night she went to step up on the bathtub and toenail was red and swollen.  Urgent care drilled it and some fluid came out.  Is losing teeth.  They are breaking off at her gumline which is why she got upper dentures.  Does not want bottom dentures and has implants for molars.  Hard to bite into something.  Follows up next Wed.  Going to find out what can be done.  Has not gone to gym for over a week--part due to rain and part was she had a falling out with her daughter--then she gets sad and depressed.  Is going back to the gym Tuesday.  Took her daughter off her chart.  Wants to have her living will done if they are where they can talk again and get that paperwork done.    Is taking her cholesterol medication in the morning.  Cheated some on her diet--got into the frozen yogurt.    Says she needs to tell me how she lives.  Can only cut back on food and utilities.  In summer, keeps all shades pulls and sets thermostat at 78 and has a big fan in each room.  Gets along ok like that in her underwear.  In winter, propane is very expensive--for heat, dryer, and hot water tank.  Turns heat down low and wears three layers.  Doing this for years.  Dentures put her in debt.  Used three credit cards and paying on them (0 interest).  6900 for her teeth total.  She does not qualify for any kind of help--just above the limit for one  person. Worries about President Trump.  Is not getting anxiety, but when she goes to bed, she does get some anxiety and takes her second alprazolam for the day but she doesn't want to do that much.  Worried about her "srhinking brain" on CT.  Does not forget to pay bills, occasionally forgets names of places or things, but not like every hour of every day.  Past Medical History  Diagnosis Date  . Diverticulosis   . Sigmoid polyp   . Colon polyp     Tubular Adenoma  . Colon polyp, hyperplastic   . Internal hemorrhoid   . Thyroid disease   . Hyperlipidemia   . Anxiety   . Hypertension   . Hypertonicity of bladder   . Insomnia, unspecified   . Flushing   . Other abnormal blood chemistry   . DDD (degenerative disc disease), lumbar   . Cataract     BILATERAL REMOVAL  . Chronic kidney disease     KIDNEY STONES 2 TIMES IN 20 YEARS     Past Surgical History  Procedure Laterality Date  . Ectopic pregnancy surgery  1965  . Breast enhancement surgery  1969    has bilateral silicone implants  . Cholecystectomy    .  Rotator cuff repair  2013  . Colonoscopy  11/24/2015    Dr. Hilarie Fredrickson  . Polypectomy      Allergies  Allergen Reactions  . Latex Swelling      Medication List       This list is accurate as of: 02/25/16  3:56 PM.  Always use your most recent med list.               ALPRAZolam 1 MG tablet  Commonly known as:  XANAX  TAKE TWO TABLETS BY MOUTH AT BEDTIME AS NEEDED FOR ANXIETY     aspirin EC 81 MG tablet  Take 1 tablet (81 mg total) by mouth daily.     buPROPion 300 MG 24 hr tablet  Commonly known as:  WELLBUTRIN XL  Take 1 tablet (300 mg total) by mouth daily.     estradiol 1 MG tablet  Commonly known as:  ESTRACE  Take 1 mg by mouth daily.     glucose blood test strip  Commonly known as:  ONETOUCH VERIO  Use to test blood sugar     levothyroxine 88 MCG tablet  Commonly known as:  SYNTHROID, LEVOTHROID  TAKE ONE TABLET BY MOUTH ONCE DAILY      losartan 25 MG tablet  Commonly known as:  COZAAR  TAKE ONE TABLET BY MOUTH ONCE DAILY     medroxyPROGESTERone 2.5 MG tablet  Commonly known as:  PROVERA  Take 2.5 mg by mouth daily.     polyethylene glycol powder powder  Commonly known as:  GLYCOLAX/MIRALAX  TAKE ONE CAPFUL BY MOUTH TWICE DAILY AS NEEDED     simvastatin 40 MG tablet  Commonly known as:  ZOCOR  TAKE ONE TABLET BY MOUTH ONCE DAILY     simvastatin 40 MG tablet  Commonly known as:  ZOCOR  Take one tablet daily for cholesterol     zolpidem 5 MG tablet  Commonly known as:  AMBIEN  TAKE BY MOUTH AS DIRECTED        Review of Systems:  Review of Systems  Constitutional: Negative for fever, chills, weight loss and malaise/fatigue.  HENT: Negative for congestion and hearing loss.        Losing teeth and teeth breaking off and already in debt over prior denture purchase  Eyes: Negative for blurred vision.       Glasses  Respiratory: Negative for shortness of breath.   Cardiovascular: Negative for chest pain, palpitations and leg swelling.  Gastrointestinal: Negative for abdominal pain, constipation, blood in stool and melena.  Genitourinary: Negative for dysuria.  Musculoskeletal: Negative for falls.  Skin: Negative for rash.  Neurological: Negative for dizziness, loss of consciousness and weakness.  Psychiatric/Behavioral: Positive for depression. Negative for hallucinations and memory loss. The patient is nervous/anxious and has insomnia.        Stress with arguments with her daughter--told me about these in full detail    Health Maintenance  Topic Date Due  . MAMMOGRAM  03/02/2012  . OPHTHALMOLOGY EXAM  06/04/2015  . FOOT EXAM  08/05/2015  . PNA vac Low Risk Adult (2 of 2 - PPSV23) 01/05/2016  . INFLUENZA VACCINE  05/03/2016  . HEMOGLOBIN A1C  05/26/2016  . TETANUS/TDAP  10/03/2018  . COLONOSCOPY  11/23/2018  . DEXA SCAN  Completed  . ZOSTAVAX  Completed  . Hepatitis C Screening  Completed     Physical Exam: Filed Vitals:   02/25/16 1535  BP: 112/62  Pulse: 75  Temp: 98 F (36.7  C)  TempSrc: Oral  Height: 4\' 11"  (1.499 m)  Weight: 133 lb (60.328 kg)  SpO2: 97%   Body mass index is 26.85 kg/(m^2). Physical Exam  Constitutional: She is oriented to person, place, and time. She appears well-developed and well-nourished.  Cardiovascular: Normal rate, regular rhythm, normal heart sounds and intact distal pulses.   Pulmonary/Chest: Effort normal and breath sounds normal. No respiratory distress.  Abdominal: Soft. Bowel sounds are normal. She exhibits no distension and no mass. There is no tenderness.  Musculoskeletal: Normal range of motion.  Neurological: She is alert and oriented to person, place, and time.  Skin:  Scaly flaky skin in scalp   Psychiatric:  Anxious and jittery, describes stress with family in detail    Labs reviewed: Basic Metabolic Panel:  Recent Labs  04/07/15 1620 08/26/15 1314 11/27/15 1335 02/22/16 1447  NA 140 140 140 139  K 4.1 4.5 4.1 3.9  CL 103 101 102 100  CO2 24 23 22 23   GLUCOSE 128* 113* 106* 100*  BUN 14 17 14 15   CREATININE 0.71 0.70 0.79 0.86  CALCIUM 9.0 9.2 8.9 9.2  TSH 0.919  --  2.290  --    Liver Function Tests:  Recent Labs  04/07/15 1620  AST 19  ALT 15  ALKPHOS 55  BILITOT 0.3  PROT 7.5  ALBUMIN 4.2   No results for input(s): LIPASE, AMYLASE in the last 8760 hours. No results for input(s): AMMONIA in the last 8760 hours. CBC:  Recent Labs  04/07/15 1620  WBC 6.6  NEUTROABS 3.7  HCT 36.9  MCV 93  PLT 312   Lipid Panel:  Recent Labs  08/26/15 1314 11/27/15 1335 02/22/16 1447  CHOL 151 146 194  HDL 52 46 52  LDLCALC 76 80 105*  TRIG 114 98 185*  CHOLHDL 2.9 3.2 3.7   Lab Results  Component Value Date   HGBA1C 5.8* 11/27/2015    Assessment/Plan 1. Recurrent major depressive disorder, in partial remission ([place]) -ongoing, cont current wellbutrin and prn xanax--again counseled on  not overusing this which has previously wound up sedating her   2. Essential hypertension, benign - bp controlled with her losartan - Basic metabolic panel; Future  3. Hyperlipidemia -cont on simvastatin, must stop cheating on diet and go to gym regularly for this and stress relief and hyperglycemia - Lipid panel; Future  4. Prediabetes - cont asa, statin, working on diet and exercise - Hemoglobin A1c; Future  5. Hypothyroidism, unspecified hypothyroidism type -cont current synthroid, chemically euthyroid  6. Insomnia -ongoing and cannot get her off her Lorrin Mais despite valiant efforts -has been counseled not to take xanax with it, but does nonetheless  Labs/tests ordered:   Orders Placed This Encounter  Procedures  . Basic metabolic panel    Standing Status: Future     Number of Occurrences:      Standing Expiration Date: 08/27/2016    Order Specific Question:  Has the patient fasted?    Answer:  Yes  . Hemoglobin A1c    Standing Status: Future     Number of Occurrences:      Standing Expiration Date: 08/27/2016  . Lipid panel    Standing Status: Future     Number of Occurrences:      Standing Expiration Date: 08/27/2016    Order Specific Question:  Has the patient fasted?    Answer:  Yes    Next appt:  3 mos with labs before  Jourdin Connors L. Boone Gear, D.O.  [facility] [address], [place] [address] Cell Phone (Mon-Fri 8am-5pm):  [phone] On Call:  [phone] & follow prompts after 5pm & weekends Office Phone:  [phone] Office Fax:  [phone]

## 2016-02-26 ENCOUNTER — Telehealth: Payer: Self-pay | Admitting: *Deleted

## 2016-02-26 DIAGNOSIS — R7303 Prediabetes: Secondary | ICD-10-CM

## 2016-02-26 NOTE — Telephone Encounter (Signed)
Patient called back and her lab for A1c is scheduled for 03/02/2016.

## 2016-02-26 NOTE — Telephone Encounter (Signed)
Left message on machine for patient to return my call, we tried to add in a HbgA1c to her previous lab which a fax from [facility] said it can't be done and patient need to come back in the office to have this done.  Order has been put in

## 2016-03-02 ENCOUNTER — Other Ambulatory Visit: Payer: Self-pay | Admitting: Internal Medicine

## 2016-03-02 ENCOUNTER — Other Ambulatory Visit: Payer: Self-pay | Admitting: *Deleted

## 2016-03-02 ENCOUNTER — Other Ambulatory Visit: Payer: Medicare Other

## 2016-03-02 DIAGNOSIS — R7303 Prediabetes: Secondary | ICD-10-CM

## 2016-03-02 MED ORDER — ALPRAZOLAM 1 MG PO TABS: ORAL_TABLET | ORAL | Status: DC

## 2016-03-04 ENCOUNTER — Other Ambulatory Visit: Payer: Self-pay | Admitting: Internal Medicine

## 2016-03-10 ENCOUNTER — Telehealth: Payer: Self-pay

## 2016-03-10 NOTE — Telephone Encounter (Signed)
Patient called to find out what the results form her blood work was as she has not heard from our office about them.I looked at the result notes and did not see anything for her. But see that lab was drawn but there are no notes about them. Will call patient back let her know doctor has not reviewed them. But as soon as she does someone will contact her.

## 2016-03-15 ENCOUNTER — Other Ambulatory Visit: Payer: Self-pay | Admitting: Internal Medicine

## 2016-04-13 ENCOUNTER — Other Ambulatory Visit: Payer: Self-pay | Admitting: Nurse Practitioner

## 2016-04-13 LAB — HEMOGLOBIN A1C
Est. average glucose Bld gHb Est-mCnc: 126 mg/dL
Hgb A1c MFr Bld: 6 % — ABNORMAL HIGH (ref 4.8–5.6)

## 2016-05-03 ENCOUNTER — Other Ambulatory Visit: Payer: Self-pay | Admitting: Internal Medicine

## 2016-05-04 ENCOUNTER — Other Ambulatory Visit: Payer: Self-pay | Admitting: Internal Medicine

## 2016-05-20 NOTE — Addendum Note (Signed)
Addended by: Logan Bores on: 05/20/2016 03:12 PM   Modules accepted: Orders

## 2016-05-25 ENCOUNTER — Other Ambulatory Visit: Payer: Medicare Other

## 2016-05-25 DIAGNOSIS — E785 Hyperlipidemia, unspecified: Secondary | ICD-10-CM

## 2016-05-25 DIAGNOSIS — I10 Essential (primary) hypertension: Secondary | ICD-10-CM | POA: Diagnosis not present

## 2016-05-25 LAB — LIPID PANEL
Cholesterol: 150 mg/dL (ref 125–200)
HDL: 57 mg/dL (ref >=46)
LDL Cholesterol: 77 mg/dL (ref <130)
Total CHOL/HDL Ratio: 2.6 Ratio (ref <=5.0)
Triglycerides: 81 mg/dL (ref <150)
VLDL: 16 mg/dL (ref <30)

## 2016-05-25 LAB — BASIC METABOLIC PANEL
BUN: 14 mg/dL (ref 7–25)
CO2: 24 mmol/L (ref 20–31)
Calcium: 9.3 mg/dL (ref 8.6–10.4)
Chloride: 105 mmol/L (ref 98–110)
Creat: 0.83 mg/dL (ref 0.60–0.93)
Glucose, Bld: 95 mg/dL (ref 65–99)
Potassium: 4.2 mmol/L (ref 3.5–5.3)
Sodium: 140 mmol/L (ref 135–146)

## 2016-05-26 ENCOUNTER — Encounter: Payer: Self-pay | Admitting: *Deleted

## 2016-05-30 ENCOUNTER — Encounter: Payer: Self-pay | Admitting: Internal Medicine

## 2016-05-30 ENCOUNTER — Ambulatory Visit (INDEPENDENT_AMBULATORY_CARE_PROVIDER_SITE_OTHER): Payer: Medicare Other | Admitting: Internal Medicine

## 2016-05-30 VITALS — BP 110/70 | HR 68 | Temp 98.4°F | Ht 59.0 in | Wt 132.0 lb

## 2016-05-30 DIAGNOSIS — Z780 Asymptomatic menopausal state: Secondary | ICD-10-CM

## 2016-05-30 DIAGNOSIS — E2839 Other primary ovarian failure: Secondary | ICD-10-CM | POA: Diagnosis not present

## 2016-05-30 DIAGNOSIS — F329 Major depressive disorder, single episode, unspecified: Secondary | ICD-10-CM

## 2016-05-30 DIAGNOSIS — E039 Hypothyroidism, unspecified: Secondary | ICD-10-CM | POA: Diagnosis not present

## 2016-05-30 DIAGNOSIS — F32A Depression, unspecified: Secondary | ICD-10-CM

## 2016-05-30 DIAGNOSIS — F418 Other specified anxiety disorders: Secondary | ICD-10-CM | POA: Diagnosis not present

## 2016-05-30 DIAGNOSIS — Z23 Encounter for immunization: Secondary | ICD-10-CM

## 2016-05-30 DIAGNOSIS — E785 Hyperlipidemia, unspecified: Secondary | ICD-10-CM

## 2016-05-30 DIAGNOSIS — R7303 Prediabetes: Secondary | ICD-10-CM

## 2016-05-30 DIAGNOSIS — Z1239 Encounter for other screening for malignant neoplasm of breast: Secondary | ICD-10-CM

## 2016-05-30 DIAGNOSIS — I10 Essential (primary) hypertension: Secondary | ICD-10-CM | POA: Diagnosis not present

## 2016-05-30 DIAGNOSIS — Z1231 Encounter for screening mammogram for malignant neoplasm of breast: Secondary | ICD-10-CM

## 2016-05-30 DIAGNOSIS — F419 Anxiety disorder, unspecified: Secondary | ICD-10-CM

## 2016-05-30 NOTE — Progress Notes (Signed)
Location:  [facility] clinic Provider:  Aritza Brunet L. Mariea Clonts, D.O., C.M.D.  Code Status: full code Goals of Care:  Advanced Directives 05/30/2016  Does patient have an advance directive? No  Type of Advance Directive -  Does patient want to make changes to advanced directive? -  Copy of advanced directive(s) in chart? -  Would patient like information on creating an advanced directive? Yes - Geneticist, molecular Complaint  Patient presents with  . Medical Management of Chronic Issues    3 mth follow-up    HPI: Patient is a 70 y.o. female seen today for medical management of chronic diseases.    Bad cholesterol improved from 105 to 77.   She says she is back on track.  Is going to the gym pretty regularly again.  Had two weeks she missed, but back yesterday.  Goes every other day.   She is going to be careful with her diet.  Wt down 1 lb.  Mood generally better.  Says she misses laughing and giggling b/c she spends so much time alone.   Sleeping well now and says she's been dreaming a lot.  Getting 5 hrs of sleep which is good for her.  Takes her medication at 12:45 and plans to keep back them up until she is going to sleep at a normal hour.  Trying to move her sleep to a more normal schedule.    BP is great.    Is taking her thyroid medication as directed.  Past Medical History:  Diagnosis Date  . Anxiety   . Cataract    BILATERAL REMOVAL  . Chronic kidney disease    KIDNEY STONES 2 TIMES IN 20 YEARS   . Colon polyp    Tubular Adenoma  . Colon polyp, hyperplastic   . DDD (degenerative disc disease), lumbar   . Diverticulosis   . Flushing   . Hyperlipidemia   . Hypertension   . Hypertonicity of bladder   . Insomnia, unspecified   . Internal hemorrhoid   . Other abnormal blood chemistry   . Sigmoid polyp   . Thyroid disease     Past Surgical History:  Procedure Laterality Date  . [place]   has bilateral silicone implants  .  CHOLECYSTECTOMY    . COLONOSCOPY  11/24/2015   Dr. Hilarie Fredrickson  . [place]  . POLYPECTOMY    . ROTATOR CUFF REPAIR  2013    Allergies  Allergen Reactions  . Latex Swelling      Medication List       Accurate as of 05/30/16  3:47 PM. Always use your most recent med list.          ALPRAZolam 1 MG tablet Commonly known as:  XANAX TAKE TWO TABLETS BY MOUTH AT BEDTIME AS NEEDED FOR ANXIETY   aspirin EC 81 MG tablet Take 1 tablet (81 mg total) by mouth daily.   buPROPion 300 MG 24 hr tablet Commonly known as:  WELLBUTRIN XL Take 1 tablet (300 mg total) by mouth daily.   estradiol 1 MG tablet Commonly known as:  ESTRACE Take 1 mg by mouth daily.   glucose blood test strip Commonly known as:  ONETOUCH VERIO Use to test blood sugar   levothyroxine 88 MCG tablet Commonly known as:  SYNTHROID, LEVOTHROID TAKE ONE TABLET BY MOUTH ONCE DAILY   losartan 25 MG tablet Commonly known as:  COZAAR TAKE ONE TABLET BY MOUTH ONCE  DAILY   medroxyPROGESTERone 2.5 MG tablet Commonly known as:  PROVERA Take 2.5 mg by mouth daily.   polyethylene glycol powder powder Commonly known as:  GLYCOLAX/MIRALAX TAKE ONE CAPFUL BY MOUTH TWICE DAILY AS NEEDED   simvastatin 40 MG tablet Commonly known as:  ZOCOR Take one tablet daily for cholesterol   zolpidem 5 MG tablet Commonly known as:  AMBIEN TAKE BY MOUTH AS DIRECTED       Review of Systems:  Review of Systems  Constitutional: Positive for weight loss. Negative for chills, fever and malaise/fatigue.  HENT: Negative for congestion and hearing loss.   Eyes: Negative for blurred vision.       Glasses  Respiratory: Negative for cough and shortness of breath.   Cardiovascular: Negative for chest pain and palpitations.  Gastrointestinal: Negative for abdominal pain, blood in stool, constipation, melena, nausea and vomiting.  Genitourinary: Negative for dysuria, frequency and urgency.  Musculoskeletal: Negative  for back pain, falls, joint pain and myalgias.  Skin: Negative for itching and rash.  Neurological: Negative for dizziness, loss of consciousness, weakness and headaches.  Endo/Heme/Allergies: Does not bruise/bleed easily.  Psychiatric/Behavioral: Negative for depression and memory loss. The patient is nervous/anxious and has insomnia.     Health Maintenance  Topic Date Due  . MAMMOGRAM  03/02/2012  . OPHTHALMOLOGY EXAM  06/04/2015  . FOOT EXAM  08/05/2015  . PNA vac Low Risk Adult (2 of 2 - PPSV23) 01/05/2016  . INFLUENZA VACCINE  05/03/2016  . HEMOGLOBIN A1C  09/01/2016  . TETANUS/TDAP  10/03/2018  . COLONOSCOPY  11/23/2018  . DEXA SCAN  Completed  . ZOSTAVAX  Completed  . Hepatitis C Screening  Completed    Physical Exam: Vitals:   05/30/16 1524  BP: 110/70  Pulse: 68  Temp: 98.4 F (36.9 C)  TempSrc: Oral  SpO2: 97%  Weight: 132 lb (59.9 kg)  Height: 4\' 11"  (1.499 m)   Body mass index is 26.66 kg/m. Physical Exam  Constitutional: She is oriented to person, place, and time. She appears well-developed and well-nourished. No distress.  Cardiovascular: Normal rate, regular rhythm, normal heart sounds and intact distal pulses.   Pulmonary/Chest: Effort normal and breath sounds normal. No respiratory distress.  Abdominal: Soft. Bowel sounds are normal.  Musculoskeletal: Normal range of motion.  Neurological: She is alert and oriented to person, place, and time.  Skin: Skin is warm and dry.  Scaly skin at hairline and scalp    Labs reviewed: Basic Metabolic Panel:  Recent Labs  11/27/15 1335 02/22/16 1447 05/25/16 1504  NA 140 139 140  K 4.1 3.9 4.2  CL 102 100 105  CO2 22 23 24   GLUCOSE 106* 100* 95  BUN 14 15 14   CREATININE 0.79 0.86 0.83  CALCIUM 8.9 9.2 9.3  TSH 2.290  --   --    Liver Function Tests: No results for input(s): AST, ALT, ALKPHOS, BILITOT, PROT, ALBUMIN in the last 8760 hours. No results for input(s): LIPASE, AMYLASE in the last 8760  hours. No results for input(s): AMMONIA in the last 8760 hours. CBC: No results for input(s): WBC, NEUTROABS, HGB, HCT, MCV, PLT in the last 8760 hours. Lipid Panel:  Recent Labs  11/27/15 1335 02/22/16 1447 05/25/16 1504  CHOL 146 194 150  HDL 46 52 57  LDLCALC 80 105* 77  TRIG 98 185* 81  CHOLHDL 3.2 3.7 2.6   Lab Results  Component Value Date   HGBA1C 6.0 (H) 03/02/2016  Assessment/Plan 1. Prediabetes - is doing better with diet and exercise so expect improvement - CBC with Differential/Platelet; Future - Comprehensive metabolic panel; Future - Hemoglobin A1c; Future  2. Hyperlipidemia -much better on labs since she's back to the gym - Lipid panel; Future  3. Anxiety and depression -chronic, longstanding problem--is dependent on her xanax and ambien  4. Essential hypertension, benign -bp well controlled with current regimen, cont same - CBC with Differential/Platelet; Future - Comprehensive metabolic panel; Future  5. Hypothyroidism, unspecified hypothyroidism type - cont current synthroid and monitor - TSH; Future  6. Need for prophylactic vaccination and inoculation against influenza - Flu Vaccine QUAD 36+ mos PF IM (Fluarix & Fluzone Quad PF) given today  Encounter for screening mammogram for breast cancer - MM DIGITAL SCREENING BILATERAL; Future--pt really does not want this due to her prior experience and implants  Postmenopausal - DG Bone Density; Future  Estrogen deficiency - DG Bone Density; Future ordered, but patient never went last time  Labs/tests ordered:   Orders Placed This Encounter  Procedures  . MM DIGITAL SCREENING BILATERAL    Standing Status:   Future    Standing Expiration Date:   07/30/2017    Order Specific Question:   Reason for exam:    Answer:   screening for breast cancer    Order Specific Question:   Preferred imaging location?    Answer:   [facility]  . DG Bone Density    Standing Status:   Future     Standing Expiration Date:   07/30/2017    Order Specific Question:   Reason for Exam (SYMPTOM  OR DIAGNOSIS REQUIRED)    Answer:   estrogen deficiency, postmenopausal    Order Specific Question:   Preferred imaging location?    Answer:   [facility]  . Flu Vaccine QUAD 36+ mos PF IM (Fluarix & Fluzone Quad PF)  . CBC with Differential/Platelet    Standing Status:   Future    Standing Expiration Date:   11/30/2016  . Comprehensive metabolic panel    Standing Status:   Future    Standing Expiration Date:   11/30/2016    Order Specific Question:   Has the patient fasted?    Answer:   Yes  . Hemoglobin A1c    Standing Status:   Future    Standing Expiration Date:   11/30/2016  . TSH    Standing Status:   Future    Standing Expiration Date:   11/30/2016  . Lipid panel    Standing Status:   Future    Standing Expiration Date:   11/30/2016    Order Specific Question:   Has the patient fasted?    Answer:   Yes    Next appt:  09/08/2016 for med mgt, labs before  Chandni Gagan L. Jema Deegan, D.O. [facility] [address], [place] [address] Cell Phone (Mon-Fri 8am-5pm):  [phone] On Call:  [phone] & follow prompts after 5pm & weekends Office Phone:  [phone] Office Fax:  [phone]

## 2016-06-04 ENCOUNTER — Other Ambulatory Visit: Payer: Self-pay | Admitting: Internal Medicine

## 2016-06-05 ENCOUNTER — Other Ambulatory Visit: Payer: Self-pay | Admitting: Internal Medicine

## 2016-06-09 ENCOUNTER — Other Ambulatory Visit: Payer: Self-pay | Admitting: Internal Medicine

## 2016-06-10 ENCOUNTER — Other Ambulatory Visit: Payer: Medicare Other

## 2016-06-27 ENCOUNTER — Other Ambulatory Visit: Payer: Self-pay | Admitting: Internal Medicine

## 2016-06-27 DIAGNOSIS — F329 Major depressive disorder, single episode, unspecified: Secondary | ICD-10-CM

## 2016-06-27 DIAGNOSIS — F419 Anxiety disorder, unspecified: Principal | ICD-10-CM

## 2016-07-07 ENCOUNTER — Other Ambulatory Visit: Payer: Self-pay | Admitting: Internal Medicine

## 2016-07-11 ENCOUNTER — Other Ambulatory Visit: Payer: Self-pay | Admitting: Internal Medicine

## 2016-07-19 DIAGNOSIS — H31011 Macula scars of posterior pole (postinflammatory) (post-traumatic), right eye: Secondary | ICD-10-CM | POA: Diagnosis not present

## 2016-07-19 DIAGNOSIS — H11423 Conjunctival edema, bilateral: Secondary | ICD-10-CM | POA: Diagnosis not present

## 2016-07-19 DIAGNOSIS — H40013 Open angle with borderline findings, low risk, bilateral: Secondary | ICD-10-CM | POA: Diagnosis not present

## 2016-07-19 DIAGNOSIS — H524 Presbyopia: Secondary | ICD-10-CM | POA: Diagnosis not present

## 2016-07-19 DIAGNOSIS — H18413 Arcus senilis, bilateral: Secondary | ICD-10-CM | POA: Diagnosis not present

## 2016-07-19 DIAGNOSIS — H04123 Dry eye syndrome of bilateral lacrimal glands: Secondary | ICD-10-CM | POA: Diagnosis not present

## 2016-07-19 DIAGNOSIS — Z9849 Cataract extraction status, unspecified eye: Secondary | ICD-10-CM | POA: Diagnosis not present

## 2016-07-19 DIAGNOSIS — H5201 Hypermetropia, right eye: Secondary | ICD-10-CM | POA: Diagnosis not present

## 2016-07-19 DIAGNOSIS — Z961 Presence of intraocular lens: Secondary | ICD-10-CM | POA: Diagnosis not present

## 2016-07-19 DIAGNOSIS — H5053 Vertical heterophoria: Secondary | ICD-10-CM | POA: Diagnosis not present

## 2016-07-19 DIAGNOSIS — H52223 Regular astigmatism, bilateral: Secondary | ICD-10-CM | POA: Diagnosis not present

## 2016-08-04 ENCOUNTER — Telehealth: Payer: Self-pay | Admitting: Internal Medicine

## 2016-08-04 NOTE — Telephone Encounter (Signed)
left msg asking pt to confirm if she can make this AWV appt w/ nurse. VDM (DD)

## 2016-09-05 ENCOUNTER — Ambulatory Visit: Payer: Medicare Other

## 2016-09-05 ENCOUNTER — Other Ambulatory Visit: Payer: Medicare Other

## 2016-09-06 ENCOUNTER — Other Ambulatory Visit: Payer: Self-pay | Admitting: Internal Medicine

## 2016-09-06 ENCOUNTER — Other Ambulatory Visit: Payer: Medicare Other

## 2016-09-06 DIAGNOSIS — I10 Essential (primary) hypertension: Secondary | ICD-10-CM | POA: Diagnosis not present

## 2016-09-06 DIAGNOSIS — E785 Hyperlipidemia, unspecified: Secondary | ICD-10-CM

## 2016-09-06 DIAGNOSIS — E039 Hypothyroidism, unspecified: Secondary | ICD-10-CM

## 2016-09-06 DIAGNOSIS — R7303 Prediabetes: Secondary | ICD-10-CM

## 2016-09-06 LAB — CBC WITH DIFFERENTIAL/PLATELET
Basophils Absolute: 0 cells/uL (ref 0–200)
Basophils Relative: 0 %
Eosinophils Absolute: 49 cells/uL (ref 15–500)
Eosinophils Relative: 1 %
HCT: 37.4 % (ref 35.0–45.0)
Hemoglobin: 12.4 g/dL (ref 11.7–15.5)
Lymphocytes Relative: 50 %
Lymphs Abs: 2450 cells/uL (ref 850–3900)
MCH: 30.6 pg (ref 27.0–33.0)
MCHC: 33.2 g/dL (ref 32.0–36.0)
MCV: 92.3 fL (ref 80.0–100.0)
MPV: 9.3 fL (ref 7.5–12.5)
Monocytes Absolute: 392 cells/uL (ref 200–950)
Monocytes Relative: 8 %
Neutro Abs: 2009 cells/uL (ref 1500–7800)
Neutrophils Relative %: 41 %
Platelets: 234 10*3/uL (ref 140–400)
RBC: 4.05 MIL/uL (ref 3.80–5.10)
RDW: 14.1 % (ref 11.0–15.0)
WBC: 4.9 10*3/uL (ref 3.8–10.8)

## 2016-09-06 LAB — TSH: TSH: 4.46 mIU/L

## 2016-09-07 ENCOUNTER — Other Ambulatory Visit: Payer: Self-pay | Admitting: Nurse Practitioner

## 2016-09-07 LAB — LIPID PANEL
Cholesterol: 165 mg/dL (ref <200)
HDL: 58 mg/dL (ref >50)
LDL Cholesterol: 93 mg/dL (ref <100)
Total CHOL/HDL Ratio: 2.8 Ratio (ref <5.0)
Triglycerides: 70 mg/dL (ref <150)
VLDL: 14 mg/dL (ref <30)

## 2016-09-07 LAB — COMPREHENSIVE METABOLIC PANEL
ALT: 16 U/L (ref 6–29)
AST: 12 U/L (ref 10–35)
Albumin: 3.7 g/dL (ref 3.6–5.1)
Alkaline Phosphatase: 54 U/L (ref 33–130)
BUN: 12 mg/dL (ref 7–25)
CO2: 24 mmol/L (ref 20–31)
Calcium: 8.8 mg/dL (ref 8.6–10.4)
Chloride: 106 mmol/L (ref 98–110)
Creat: 0.81 mg/dL (ref 0.60–0.93)
Glucose, Bld: 88 mg/dL (ref 65–99)
Potassium: 4.3 mmol/L (ref 3.5–5.3)
Sodium: 140 mmol/L (ref 135–146)
Total Bilirubin: 0.2 mg/dL (ref 0.2–1.2)
Total Protein: 7.1 g/dL (ref 6.1–8.1)

## 2016-09-07 LAB — HEMOGLOBIN A1C
Hgb A1c MFr Bld: 5.5 % (ref <5.7)
Mean Plasma Glucose: 111 mg/dL

## 2016-09-08 ENCOUNTER — Ambulatory Visit (INDEPENDENT_AMBULATORY_CARE_PROVIDER_SITE_OTHER): Payer: Medicare Other | Admitting: Internal Medicine

## 2016-09-08 ENCOUNTER — Ambulatory Visit (INDEPENDENT_AMBULATORY_CARE_PROVIDER_SITE_OTHER): Payer: Medicare Other

## 2016-09-08 ENCOUNTER — Other Ambulatory Visit: Payer: Self-pay | Admitting: Nurse Practitioner

## 2016-09-08 ENCOUNTER — Encounter: Payer: Self-pay | Admitting: Internal Medicine

## 2016-09-08 ENCOUNTER — Encounter: Payer: Self-pay | Admitting: *Deleted

## 2016-09-08 VITALS — BP 126/68 | HR 67 | Temp 98.1°F | Ht <= 58 in | Wt 140.0 lb

## 2016-09-08 VITALS — BP 126/68 | HR 67 | Temp 98.1°F | Ht <= 58 in | Wt 140.2 lb

## 2016-09-08 DIAGNOSIS — F3341 Major depressive disorder, recurrent, in partial remission: Secondary | ICD-10-CM

## 2016-09-08 DIAGNOSIS — F332 Major depressive disorder, recurrent severe without psychotic features: Secondary | ICD-10-CM | POA: Insufficient documentation

## 2016-09-08 DIAGNOSIS — Z0000 Encounter for general adult medical examination without abnormal findings: Secondary | ICD-10-CM

## 2016-09-08 DIAGNOSIS — F411 Generalized anxiety disorder: Secondary | ICD-10-CM | POA: Insufficient documentation

## 2016-09-08 DIAGNOSIS — Z23 Encounter for immunization: Secondary | ICD-10-CM | POA: Diagnosis not present

## 2016-09-08 DIAGNOSIS — F5101 Primary insomnia: Secondary | ICD-10-CM | POA: Diagnosis not present

## 2016-09-08 DIAGNOSIS — I10 Essential (primary) hypertension: Secondary | ICD-10-CM

## 2016-09-08 DIAGNOSIS — E782 Mixed hyperlipidemia: Secondary | ICD-10-CM

## 2016-09-08 DIAGNOSIS — R7303 Prediabetes: Secondary | ICD-10-CM

## 2016-09-08 MED ORDER — ZOLPIDEM TARTRATE 5 MG PO TABS: 5.0000 mg | ORAL_TABLET | Freq: Every day | ORAL | 0 refills | Status: DC

## 2016-09-08 NOTE — Patient Instructions (Addendum)
Kelli [name] , Thank you for taking time to come for your Medicare Wellness Visit. I appreciate your ongoing commitment to your health goals. Please review the following plan we discussed and let me know if I can assist you in the future.   These are the goals we discussed: Goals    . Exercise 3x per week (30 min per time)          Starting 09/08/16, I will maintain my current exercise routine of 2-3 times per week at the gym. I would like weigh 125lb       This is a list of the screening recommended for you and due dates:  Health Maintenance  Topic Date Due  . Complete foot exam   08/05/2015  . Hemoglobin A1C  03/07/2017  . Eye exam for diabetics  07/12/2017  . Tetanus Vaccine  10/03/2018  . Colon Cancer Screening  11/23/2018  . Flu Shot  Completed  . DEXA scan (bone density measurement)  Completed  . Shingles Vaccine  Completed  .  Hepatitis C: One time screening is recommended by Center for Disease Control  (CDC) for  adults born from [birth year] through 1967.   Completed  . Pneumonia vaccines  Completed  Preventive Care for Adults  A healthy lifestyle and preventive care can promote health and wellness. Preventive health guidelines for adults include the following key practices.  . A routine yearly physical is a good way to check with your health care provider about your health and preventive screening. It is a chance to share any concerns and updates on your health and to receive a thorough exam.  . Visit your dentist for a routine exam and preventive care every 6 months. Brush your teeth twice a day and floss once a day. Good oral hygiene prevents tooth decay and gum disease.  . The frequency of eye exams is based on your age, health, family medical history, use  of contact lenses, and other factors. Follow your health care provider's ecommendations for frequency of eye exams.  . Eat a healthy diet. Foods like vegetables, fruits, whole grains, low-fat dairy products, and lean  protein foods contain the nutrients you need without too many calories. Decrease your intake of foods high in solid fats, added sugars, and salt. Eat the right amount of calories for you. Get information about a proper diet from your health care provider, if necessary.  . Regular physical exercise is one of the most important things you can do for your health. Most adults should get at least 150 minutes of moderate-intensity exercise (any activity that increases your heart rate and causes you to sweat) each week. In addition, most adults need muscle-strengthening exercises on 2 or more days a week.  Silver Sneakers may be a benefit available to you. To determine eligibility, you may visit the website: www.silversneakers.com or contact program at 408-626-8998 Mon-Fri between 8AM-8PM.   . Maintain a healthy weight. The body mass index (BMI) is a screening tool to identify possible weight problems. It provides an estimate of body fat based on height and weight. Your health care provider can find your BMI and can help you achieve or maintain a healthy weight.   For adults 20 years and older: ? A BMI below 18.5 is considered underweight. ? A BMI of 18.5 to 24.9 is normal. ? A BMI of 25 to 29.9 is considered overweight. ? A BMI of 30 and above is considered obese.   . Maintain normal blood lipids  and cholesterol levels by exercising and minimizing your intake of saturated fat. Eat a balanced diet with plenty of fruit and vegetables. Blood tests for lipids and cholesterol should begin at age 43 and be repeated every 5 years. If your lipid or cholesterol levels are high, you are over 50, or you are at high risk for heart disease, you may need your cholesterol levels checked more frequently. Ongoing high lipid and cholesterol levels should be treated with medicines if diet and exercise are not working.  . If you smoke, find out from your health care provider how to quit. If you do not use tobacco, please  do not start.  . If you choose to drink alcohol, please do not consume more than 2 drinks per day. One drink is considered to be 12 ounces (355 mL) of beer, 5 ounces (148 mL) of wine, or 1.5 ounces (44 mL) of liquor.  . If you are 65-53 years old, ask your health care provider if you should take aspirin to prevent strokes.  . Use sunscreen. Apply sunscreen liberally and repeatedly throughout the day. You should seek shade when your shadow is shorter than you. Protect yourself by wearing long sleeves, pants, a wide-brimmed hat, and sunglasses year round, whenever you are outdoors.  . Once a month, do a whole body skin exam, using a mirror to look at the skin on your back. Tell your health care provider of new moles, moles that have irregular borders, moles that are larger than a pencil eraser, or moles that have changed in shape or color.

## 2016-09-08 NOTE — Progress Notes (Signed)
Subjective:   Kelli [name] is a 70 y.o. female who presents for Medicare Annual (Subsequent) preventive examination.  Review of Systems:  Cardiac Risk Factors include: advanced age (>64men, >67 women)     Objective:     Vitals: BP 126/68 (BP Location: Right Arm, Patient Position: Sitting, Cuff Size: Normal)   Pulse 67   Temp 98.1 F (36.7 C) (Oral)   Ht 4' 1.5" (1.257 m)   Wt 140 lb 3.2 oz (63.6 kg)   SpO2 98%   BMI 40.23 kg/m   Body mass index is 40.23 kg/m.   Tobacco History  Smoking Status  . Former Smoker  . Quit date: 11/30/1995  Smokeless Tobacco  . Never Used     Counseling given: No   Past Medical History:  Diagnosis Date  . Anxiety   . Cataract    BILATERAL REMOVAL  . Chronic kidney disease    KIDNEY STONES 2 TIMES IN 20 YEARS   . Colon polyp    Tubular Adenoma  . Colon polyp, hyperplastic   . DDD (degenerative disc disease), lumbar   . Diverticulosis   . Flushing   . Hyperlipidemia   . Hypertension   . Hypertonicity of bladder   . Insomnia, unspecified   . Internal hemorrhoid   . Other abnormal blood chemistry   . Sigmoid polyp   . Thyroid disease    Past Surgical History:  Procedure Laterality Date  . [place]   has bilateral silicone implants  . CHOLECYSTECTOMY    . COLONOSCOPY  11/24/2015   Dr. Hilarie Fredrickson  . [place]  . POLYPECTOMY    . ROTATOR CUFF REPAIR  2013   Family History  Problem Relation Age of Onset  . Cancer Mother     lung  . Colon polyps Mother   . Stroke Father   . Colon cancer Neg Hx   . Rectal cancer Neg Hx   . Stomach cancer Neg Hx   . Esophageal cancer Neg Hx    History  Sexual Activity  . Sexual activity: No    Outpatient Encounter Prescriptions as of 09/08/2016  Medication Sig  . ALPRAZolam (XANAX) 1 MG tablet TAKE TWO TABLETS BY MOUTH ONCE DAILY AT BEDTIME AS NEEDED FOR ANXIETY  . aspirin EC 81 MG tablet Take 1 tablet (81 mg total) by mouth daily.    Marland Kitchen buPROPion (WELLBUTRIN XL) 300 MG 24 hr tablet TAKE ONE TABLET BY MOUTH ONCE DAILY  . estradiol (ESTRACE) 1 MG tablet Take 1 mg by mouth daily.  Marland Kitchen glucose blood (ONETOUCH VERIO) test strip Use to test blood sugar  . levothyroxine (SYNTHROID, LEVOTHROID) 88 MCG tablet TAKE ONE TABLET BY MOUTH ONCE DAILY  . losartan (COZAAR) 25 MG tablet TAKE ONE TABLET BY MOUTH ONCE DAILY  . medroxyPROGESTERone (PROVERA) 2.5 MG tablet Take 2.5 mg by mouth daily.  . polyethylene glycol powder (GLYCOLAX/MIRALAX) powder TAKE ONE CAPFUL BY MOUTH TWICE DAILY AS NEEDED  . simvastatin (ZOCOR) 40 MG tablet Take one tablet daily for cholesterol  . zolpidem (AMBIEN) 5 MG tablet Take 1 tablet (5 mg total) by mouth at bedtime.   No facility-administered encounter medications on file as of 09/08/2016.     Activities of Daily Living In your present state of health, do you have any difficulty performing the following activities: 09/08/2016  Hearing? N  Vision? Y  Difficulty concentrating or making decisions? Y  Walking or climbing stairs? N  Dressing  or bathing? N  Doing errands, shopping? N  Preparing Food and eating ? N  Using the Toilet? N  In the past six months, have you accidently leaked urine? Y  Do you have problems with loss of bowel control? Y  Managing your Medications? N  Managing your Finances? N  Housekeeping or managing your Housekeeping? N  Some recent data might be hidden    Patient Care Team: Gayland Curry, DO as PCP - General (Geriatric Medicine)    Assessment:    Exercise Activities and Dietary recommendations Current Exercise Habits: Structured exercise class, Type of exercise: strength training/weights;treadmill;walking, Time (Minutes): > 60, Frequency (Times/Week): 3, Weekly Exercise (Minutes/Week): 0, Intensity: Moderate, Exercise limited by: None identified  Goals    . Exercise 3x per week (30 min per time)          Starting 09/08/16, I will maintain my current exercise routine of  2-3 times per week at the gym. I would like weigh 125lb      Fall Risk Fall Risk  09/08/2016 05/30/2016 02/25/2016 08/31/2015 05/25/2015  Falls in the past year? No No No No No   Depression Screen PHQ 2/9 Scores 09/08/2016 05/30/2016 02/25/2016 05/25/2015  PHQ - 2 Score 0 0 0 0  PHQ- 9 Score - - - -     Cognitive Function MMSE - Mini Mental State Exam 09/08/2016 12/26/2013  Orientation to time 5 5  Orientation to Place 5 5  Registration 3 3  Attention/ Calculation 3 5  Recall 3 2  Language- name 2 objects 2 2  Language- repeat 1 1  Language- follow 3 step command 3 3  Language- read & follow direction 1 1  Write a sentence 1 1  Copy design 1 1  Total score 28 29        Immunization History  Administered Date(s) Administered  . Influenza,inj,Quad PF,36+ Mos 07/08/2013, 08/04/2014, 08/31/2015, 05/30/2016  . Pneumococcal Conjugate-13 01/05/2015  . Pneumococcal Polysaccharide-23 10/03/2008, 09/08/2016  . Td 10/03/2008  . Zoster 10/03/2008   Screening Tests Health Maintenance  Topic Date Due  . MAMMOGRAM  03/02/2012  . OPHTHALMOLOGY EXAM  06/04/2015  . FOOT EXAM  08/05/2015  . PNA vac Low Risk Adult (2 of 2 - PPSV23) 01/05/2016  . HEMOGLOBIN A1C  03/07/2017  . TETANUS/TDAP  10/03/2018  . COLONOSCOPY  11/23/2018  . INFLUENZA VACCINE  Completed  . DEXA SCAN  Completed  . ZOSTAVAX  Completed  . Hepatitis C Screening  Completed      Plan:    I have personally reviewed and addressed the Medicare Annual Wellness questionnaire and have noted the following in the patient's chart:  A. Medical and social history B. Use of alcohol, tobacco or illicit drugs  C. Current medications and supplements D. Functional ability and status E.  Nutritional status F.  Physical activity G. Advance directives H. List of other physicians I.  Hospitalizations, surgeries, and ER visits in previous 12 months J.  [place] to include hearing, vision, cognitive,  depression L. Referrals and appointments - none  In addition, I have reviewed and discussed with patient certain preventive protocols, quality metrics, and best practice recommendations. A written personalized care plan for preventive services as well as general preventive health recommendations were provided to patient.  See attached scanned questionnaire for additional information.   Signed,   Allyn Kenner, LPN Health Advisor    I reviewed health advisor's note, was available for consultation and agree with the assessment  and plan as written.  Pt has prediabetes.  We concluded that she never reached 6.5 hba1c and therefore never actually had a diabetes diagnosis only prediabetes/hyperglycemia.  hba1c trending down.  No foot problems. I removed the diabetes modifier from her chart.  Dodi Leu L. Lakashia Collison, D.O. [facility] [address], [place] [address] Cell Phone (Mon-Fri 8am-5pm):  [phone] On Call:  [phone] & follow prompts after 5pm & weekends Office Phone:  [phone] Office Fax:  [phone]

## 2016-09-08 NOTE — Progress Notes (Signed)
Location:  [facility] clinic Provider:  Chimene Salo L. Mariea Clonts, D.O., C.M.D.  Code Status: DNR Goals of Care:  Advanced Directives 09/08/2016  Does Patient Have a Medical Advance Directive? Yes  Type of Advance Directive [place]  Does patient want to make changes to medical advance directive? -  Copy of [place] in Chart? No - copy requested  Would patient like information on creating a medical advance directive? -     Chief Complaint  Patient presents with  . Medical Management of Chronic Issues    72mth follow-up    HPI: Patient is a 70 y.o. female seen today for medical management of chronic diseases.  This is her visit with me after her AWV.  She got her pneumonia shot.    She gave me a list of meds which are on quantity limits:  Alprazolam, bupropion, zolpidem.    Reports every once in a while, she falls of the wagon with some sweets like chocolate--sugar average is now normal actually at 5.5.  LDL trended up though to 93 from 77 with goal of 70.  She had gotten down to 129, but then ate chips and the candy.  She is back on the wagon now though.  Is still going to the gym regularly--3x per week.    Constipation is actually her only problem lately.  Says sometimes, she does not feel that she has to go ahead up further, but only when it is ready to come out.  Twice at the gym when she went in to urinate and went to clean herself, there was poop in her pants.  It's also happened a few times at home.  Will go from constipated to having too loose stools--it's kept her from going to the gym sometimes.  Alisa suggested miralax every other day regular and go from there.  Might try fiber supplement next if that's not a success.  Explained also that the xanax is likely the culprit for her constipation.  She says even before starting it or zolpidem, she had constipation.  Sleeping well 1130pm-8:30/9am.  Past Medical History:  Diagnosis Date  . Anxiety   .  Cataract    BILATERAL REMOVAL  . Chronic kidney disease    KIDNEY STONES 2 TIMES IN 20 YEARS   . Colon polyp    Tubular Adenoma  . Colon polyp, hyperplastic   . DDD (degenerative disc disease), lumbar   . Diverticulosis   . Flushing   . Hyperlipidemia   . Hypertension   . Hypertonicity of bladder   . Insomnia, unspecified   . Internal hemorrhoid   . Other abnormal blood chemistry   . Sigmoid polyp   . Thyroid disease     Past Surgical History:  Procedure Laterality Date  . [place]   has bilateral silicone implants  . CHOLECYSTECTOMY    . COLONOSCOPY  11/24/2015   Dr. Hilarie Fredrickson  . [place]  . POLYPECTOMY    . ROTATOR CUFF REPAIR  2013    Allergies  Allergen Reactions  . Latex Swelling    Allergies as of 09/08/2016      Reactions   Latex Swelling      Medication List       Accurate as of 09/08/16 11:59 PM. Always use your most recent med list.          ALPRAZolam 1 MG tablet Commonly known as:  XANAX TAKE TWO TABLETS BY  MOUTH ONCE DAILY AT BEDTIME AS NEEDED FOR ANXIETY   aspirin EC 81 MG tablet Take 1 tablet (81 mg total) by mouth daily.   buPROPion 300 MG 24 hr tablet Commonly known as:  WELLBUTRIN XL TAKE ONE TABLET BY MOUTH ONCE DAILY   estradiol 1 MG tablet Commonly known as:  ESTRACE Take 1 mg by mouth daily.   glucose blood test strip Commonly known as:  ONETOUCH VERIO Use to test blood sugar   levothyroxine 88 MCG tablet Commonly known as:  SYNTHROID, LEVOTHROID TAKE ONE TABLET BY MOUTH ONCE DAILY   losartan 25 MG tablet Commonly known as:  COZAAR TAKE ONE TABLET BY MOUTH ONCE DAILY   medroxyPROGESTERone 2.5 MG tablet Commonly known as:  PROVERA Take 2.5 mg by mouth daily.   polyethylene glycol powder powder Commonly known as:  GLYCOLAX/MIRALAX TAKE ONE CAPFUL BY MOUTH TWICE DAILY AS NEEDED   simvastatin 40 MG tablet Commonly known as:  ZOCOR Take one tablet daily for cholesterol     zolpidem 5 MG tablet Commonly known as:  AMBIEN Take 1 tablet (5 mg total) by mouth at bedtime.       Review of Systems:  Review of Systems  Constitutional: Negative for chills, fever and malaise/fatigue.  HENT: Negative for congestion and hearing loss.   Eyes: Negative for blurred vision.       Glasses  Respiratory: Negative for cough and shortness of breath.   Cardiovascular: Negative for chest pain, palpitations and leg swelling.  Gastrointestinal: Negative for abdominal pain, blood in stool, constipation and melena.  Genitourinary: Negative for dysuria.  Musculoskeletal: Negative for back pain, falls, joint pain and myalgias.  Skin: Negative for itching and rash.       Dry scaly skin  Neurological: Negative for dizziness, loss of consciousness and weakness.  Endo/Heme/Allergies: Does not bruise/bleed easily.       Prediabetes  Psychiatric/Behavioral: Negative for depression, memory loss and suicidal ideas. The patient is nervous/anxious and has insomnia.     Health Maintenance  Topic Date Due  . TETANUS/TDAP  10/03/2018  . COLONOSCOPY  11/23/2018  . INFLUENZA VACCINE  Completed  . DEXA SCAN  Completed  . ZOSTAVAX  Completed  . Hepatitis C Screening  Completed  . PNA vac Low Risk Adult  Completed    Physical Exam: Vitals:   09/08/16 1440  BP: 126/68  Pulse: 67  Temp: 98.1 F (36.7 C)  TempSrc: Oral  SpO2: 98%  Weight: 140 lb (63.5 kg)  Height: 4' 1.5" (1.257 m)   Body mass index is 40.17 kg/m. Physical Exam  Constitutional: She is oriented to person, place, and time. She appears well-developed and well-nourished.  Cardiovascular: Normal rate, regular rhythm, normal heart sounds and intact distal pulses.   Pulmonary/Chest: Effort normal and breath sounds normal. No respiratory distress.  Abdominal: Soft. Bowel sounds are normal. She exhibits no distension. There is no tenderness.  Musculoskeletal: Normal range of motion.  Neurological: She is alert and  oriented to person, place, and time.  Skin: Skin is warm and dry. Capillary refill takes less than 2 seconds. There is pallor.  Dry scaly skin of scalp  Psychiatric:  Anxious with pressured speech    Labs reviewed: Basic Metabolic Panel:  Recent Labs  11/27/15 1335 02/22/16 1447 05/25/16 1504 09/06/16 1331  NA 140 139 140 140  K 4.1 3.9 4.2 4.3  CL 102 100 105 106  CO2 22 23 24 24   GLUCOSE 106* 100* 95 88  BUN  14 15 14 12   CREATININE 0.79 0.86 0.83 0.81  CALCIUM 8.9 9.2 9.3 8.8  TSH 2.290  --   --  4.46   Liver Function Tests:  Recent Labs  09/06/16 1331  AST 12  ALT 16  ALKPHOS 54  BILITOT 0.2  PROT 7.1  ALBUMIN 3.7   No results for input(s): LIPASE, AMYLASE in the last 8760 hours. No results for input(s): AMMONIA in the last 8760 hours. CBC:  Recent Labs  09/06/16 1331  WBC 4.9  NEUTROABS 2,009  HGB 12.4  HCT 37.4  MCV 92.3  PLT 234   Lipid Panel:  Recent Labs  02/22/16 1447 05/25/16 1504 09/06/16 1331  CHOL 194 150 165  HDL 52 57 58  LDLCALC 105* 77 93  TRIG 185* 81 70  CHOLHDL 3.7 2.6 2.8   Lab Results  Component Value Date   HGBA1C 5.5 09/06/2016    Assessment/Plan 1. Essential hypertension, benign -bp well controlled on current therapy, cont same and monitor  2. Prediabetes -don't think she ever actually hit the diabetes mark, but was close -cont current regimen with better diet and regular exercise  3. Recurrent major depressive disorder, in partial remission (HCC) -stable at this time, cont wellbutrin   4. Anxiety state -has not tolerated prior attempts of tapering off her xanax--cont same  5. Mixed hyperlipidemia -cont zocor, monitor FLP, cont exercise program  6.  Insomnia Stable on her current regimen  Labs/tests ordered:  No orders of the defined types were placed in this encounter.  Next appt:  01/09/2017    Tymarion Everard L. Dorrene Bently, D.O. [facility] [address], [place] [address] Cell Phone (Mon-Fri 8am-5pm):  [phone] On Call:  [phone] & follow prompts after 5pm & weekends Office Phone:  [phone] Office Fax:  [phone]

## 2016-09-08 NOTE — Progress Notes (Signed)
Quick Notes   Health Maintenance:   Pn23 given today; Due for foot exam.   Abnormal Screen:  None; MMSE-28/30 Passed clock test   Patient Concerns:   None   Nurse Concerns:   None

## 2016-09-08 NOTE — Patient Instructions (Addendum)
I recommend a gym membership to help with obesity, diabetes, blood pressure and cholesterol management.

## 2016-10-09 ENCOUNTER — Other Ambulatory Visit: Payer: Self-pay | Admitting: Internal Medicine

## 2016-10-11 ENCOUNTER — Other Ambulatory Visit: Payer: Self-pay | Admitting: Internal Medicine

## 2016-10-31 ENCOUNTER — Other Ambulatory Visit: Payer: Self-pay | Admitting: Internal Medicine

## 2016-11-11 ENCOUNTER — Other Ambulatory Visit: Payer: Self-pay | Admitting: Nurse Practitioner

## 2016-11-11 ENCOUNTER — Other Ambulatory Visit: Payer: Self-pay | Admitting: Internal Medicine

## 2016-11-11 NOTE — Telephone Encounter (Signed)
rx called into pharmacy

## 2016-11-29 ENCOUNTER — Telehealth: Payer: Self-pay | Admitting: *Deleted

## 2016-11-29 NOTE — Telephone Encounter (Signed)
Patient called and Left message on Clinical Intake Line stating that she has a cough with wheezing and wonders if she needs an appointment.  I tried calling patient back and left message to call back to schedule an appointment for tomorrow to be evaluated.

## 2016-11-30 ENCOUNTER — Encounter: Payer: Self-pay | Admitting: Internal Medicine

## 2016-11-30 ENCOUNTER — Ambulatory Visit: Payer: Medicare Other | Admitting: Internal Medicine

## 2016-12-08 ENCOUNTER — Other Ambulatory Visit: Payer: Self-pay | Admitting: Nurse Practitioner

## 2016-12-08 ENCOUNTER — Other Ambulatory Visit: Payer: Self-pay | Admitting: Internal Medicine

## 2017-01-09 ENCOUNTER — Other Ambulatory Visit: Payer: Self-pay | Admitting: Nurse Practitioner

## 2017-01-09 ENCOUNTER — Ambulatory Visit (INDEPENDENT_AMBULATORY_CARE_PROVIDER_SITE_OTHER): Payer: Medicare Other | Admitting: Internal Medicine

## 2017-01-09 ENCOUNTER — Encounter: Payer: Self-pay | Admitting: Internal Medicine

## 2017-01-09 VITALS — BP 110/70 | HR 95 | Temp 98.4°F | Wt 136.0 lb

## 2017-01-09 DIAGNOSIS — E782 Mixed hyperlipidemia: Secondary | ICD-10-CM

## 2017-01-09 DIAGNOSIS — I10 Essential (primary) hypertension: Secondary | ICD-10-CM

## 2017-01-09 DIAGNOSIS — F3342 Major depressive disorder, recurrent, in full remission: Secondary | ICD-10-CM | POA: Diagnosis not present

## 2017-01-09 DIAGNOSIS — R7303 Prediabetes: Secondary | ICD-10-CM

## 2017-01-09 DIAGNOSIS — F5101 Primary insomnia: Secondary | ICD-10-CM | POA: Diagnosis not present

## 2017-01-09 NOTE — Progress Notes (Signed)
Location:  [facility] clinic Provider:  Nehemie Casserly L. Mariea Clonts, D.O., C.M.D.  Code Status: DNR Goals of Care:  Advanced Directives 09/08/2016  Does Patient Have a Medical Advance Directive? Yes  Type of Advance Directive [place]  Does patient want to make changes to medical advance directive? -  Copy of [place] in Chart? No - copy requested  Would patient like information on creating a medical advance directive? -   Chief Complaint  Patient presents with  . Medical Management of Chronic Issues    11mh follow-up    HPI: Patient is a 71y.o. female seen today for medical management of chronic diseases.    She had the black toenail which grew a new nail.  Right side of the great toenail looks fungal per her daughter.    She got 11000 steps today.  She walks 4 miles (1/2 before and 1/2 after her workout) and always gets 10000 steps on Monday, wed, Friday.  Weight down 4 lbs --1.5 lbs less at home this am.    Says spirits are improved since back on exercise regimen.    Tries with her diet.  Had a bad carb craving and made mac n cheese, but mostly eats veggies, chicken soup, yogurt and slimfast, some fruit mixed in.    Sleeping well on current regimen.    Past Medical History:  Diagnosis Date  . Anxiety   . Cataract    BILATERAL REMOVAL  . Chronic kidney disease    KIDNEY STONES 2 TIMES IN 20 YEARS   . Colon polyp    Tubular Adenoma  . Colon polyp, hyperplastic   . DDD (degenerative disc disease), lumbar   . Diverticulosis   . Flushing   . Hyperlipidemia   . Hypertension   . Hypertonicity of bladder   . Insomnia, unspecified   . Internal hemorrhoid   . Other abnormal blood chemistry   . Sigmoid polyp   . Thyroid disease     Past Surgical History:  Procedure Laterality Date  . [place]  has bilateral silicone implants  . CHOLECYSTECTOMY    . COLONOSCOPY  11/24/2015   Dr. [place]ilarie Fredrickson . [place] . POLYPECTOMY    . ROTATOR CUFF REPAIR  2013    Allergies  Allergen Reactions  . Latex Swelling    Allergies as of 01/09/2017      Reactions   Latex Swelling      Medication List       Accurate as of 01/09/17  2:42 PM. Always use your most recent med list.          ALPRAZolam 1 MG tablet Commonly known as:  XANAX TAKE 2 TABLETS BY MOUTH AT BEDTIME AS NEEDED FOR ANXIETY   aspirin EC 81 MG tablet Take 1 tablet (81 mg total) by mouth daily.   buPROPion 300 MG 24 hr tablet Commonly known as:  WELLBUTRIN XL TAKE ONE TABLET BY MOUTH ONCE DAILY   estradiol 1 MG tablet Commonly known as:  ESTRACE Take 1 mg by mouth daily.   levothyroxine 88 MCG tablet Commonly known as:  SYNTHROID, LEVOTHROID TAKE ONE TABLET BY MOUTH ONCE DAILY   losartan 25 MG tablet Commonly known as:  COZAAR TAKE ONE TABLET BY MOUTH ONCE DAILY   medroxyPROGESTERone 2.5 MG tablet Commonly known as:  PROVERA Take 2.5 mg by mouth daily.   polyethylene glycol powder powder Commonly known as:  GLYCOLAX/MIRALAX TAKE ONE CAPFUL BY MOUTH TWICE DAILY AS NEEDED   simvastatin 40 MG tablet Commonly known as:  ZOCOR Take one tablet daily for cholesterol   zolpidem 5 MG tablet Commonly known as:  AMBIEN TAKE 1 TABLET BY MOUTH AT BEDTIME       Review of Systems:  Review of Systems  Constitutional: Negative for chills and fever.  HENT: Negative for congestion and hearing loss.   Eyes: Negative for blurred vision.  Respiratory: Negative for cough and shortness of breath.   Cardiovascular: Negative for chest pain and palpitations.  Gastrointestinal: Negative for abdominal pain, blood in stool, constipation, diarrhea and melena.  Genitourinary: Negative for dysuria.  Musculoskeletal: Negative for falls.  Skin: Negative for rash.       seb derm  Neurological: Negative for dizziness, loss of consciousness and weakness.  Endo/Heme/Allergies:       Diabetes  Psychiatric/Behavioral: Negative for  depression and memory loss. The patient is not nervous/anxious and does not have insomnia.     Health Maintenance  Topic Date Due  . INFLUENZA VACCINE  05/03/2017  . TETANUS/TDAP  10/03/2018  . COLONOSCOPY  11/23/2018  . DEXA SCAN  Completed  . Hepatitis C Screening  Completed  . PNA vac Low Risk Adult  Completed    Physical Exam: Vitals:   01/09/17 1418  BP: 110/70  Pulse: 95  Temp: 98.4 F (36.9 C)  TempSrc: Oral  SpO2: 96%  Weight: 136 lb (61.7 kg)   Body mass index is 39.02 kg/m. Physical Exam  Constitutional: She is oriented to person, place, and time. She appears well-developed and well-nourished. No distress.  Cardiovascular: Normal rate, regular rhythm, normal heart sounds and intact distal pulses.   Pulmonary/Chest: Effort normal and breath sounds normal. No respiratory distress.  Musculoskeletal: Normal range of motion.  Neurological: She is alert and oriented to person, place, and time.  Skin: No rash noted. No erythema. No pallor.  Right great toenail with fungus on medial aspect; thick scaly patches on scalp  Psychiatric: She has a normal mood and affect.  Very pleasant and spirits much better today    Labs reviewed: Basic Metabolic Panel:  Recent Labs  02/22/16 1447 05/25/16 1504 09/06/16 1331  NA 139 140 140  K 3.9 4.2 4.3  CL 100 105 106  CO2 _0 GLUCOSE 100* 95 88  BUN _1 CREATININE 0.86 0.83 0.81  CALCIUM 9.2 9.3 8.8  TSH  --   --  4.46   Liver Function Tests:  Recent Labs  09/06/16 1331  AST 12  ALT 16  ALKPHOS 54  BILITOT 0.2  PROT 7.1  ALBUMIN 3.7   No results for input(s): LIPASE, AMYLASE in the last 8760 hours. No results for input(s): AMMONIA in the last 8760 hours. CBC:  Recent Labs  09/06/16 1331  WBC 4.9  NEUTROABS 2,009  HGB 12.4  HCT 37.4  MCV 92.3  PLT 234   Lipid Panel:  Recent Labs  02/22/16 1447 05/25/16 1504 09/06/16 1331  CHOL 194 150 165  HDL 52 57 58  LDLCALC 105* 77 93  TRIG  185* 81 70  CHOLHDL 3.7 2.6 2.8   Lab Results  Component Value Date   HGBA1C 5.5 09/06/2016    Assessment/Plan 1. Essential hypertension, benign -bp well controlled on current regimen, cont same w/o change - CBC with Differential/Platelet; Future - Basic metabolic panel; Future  2. Prediabetes - last hba1c was excellent and has been  exercising more vigorously recently so expect it to continue to be great before next visit -no med changes - Hemoglobin A1c; Future - Lipid panel; Future  3. Recurrent major depressive disorder, in full remission ([place]) -improved since returning to exercise, cont same wellbutrin  4. Mixed hyperlipidemia -cont zocor therapy and monitor - Lipid panel; Future  5. Primary insomnia -continues on ambien for sleep, sleeping much better since exercising again  Labs/tests ordered:   Orders Placed This Encounter  Procedures  . CBC with Differential/Platelet    Standing Status:   Future    Standing Expiration Date:   09/10/2017  . Hemoglobin A1c    Standing Status:   Future    Standing Expiration Date:   09/10/2017  . Lipid panel    Standing Status:   Future    Standing Expiration Date:   09/10/2017    Order Specific Question:   Has the patient fasted?    Answer:   Yes  . Basic metabolic panel    Standing Status:   Future    Standing Expiration Date:   09/10/2017    Order Specific Question:   Has the patient fasted?    Answer:   Yes    Next appt:  05/11/2017    Chanteria Haggard L. Davanee Klinkner, D.O. [facility] [address], [place] [address] Cell Phone (Mon-Fri 8am-5pm):  [phone] On Call:  [phone] & follow prompts after 5pm & weekends Office Phone:  [phone] Office Fax:  [phone]

## 2017-01-11 ENCOUNTER — Other Ambulatory Visit: Payer: Self-pay | Admitting: Nurse Practitioner

## 2017-01-28 ENCOUNTER — Other Ambulatory Visit: Payer: Self-pay | Admitting: Internal Medicine

## 2017-01-28 DIAGNOSIS — F329 Major depressive disorder, single episode, unspecified: Secondary | ICD-10-CM

## 2017-01-28 DIAGNOSIS — F419 Anxiety disorder, unspecified: Principal | ICD-10-CM

## 2017-02-06 DIAGNOSIS — M2011 Hallux valgus (acquired), right foot: Secondary | ICD-10-CM | POA: Diagnosis not present

## 2017-02-08 ENCOUNTER — Other Ambulatory Visit: Payer: Self-pay | Admitting: Internal Medicine

## 2017-02-13 ENCOUNTER — Other Ambulatory Visit: Payer: Self-pay | Admitting: Internal Medicine

## 2017-02-28 ENCOUNTER — Telehealth: Payer: Self-pay | Admitting: *Deleted

## 2017-02-28 MED ORDER — ESTRADIOL 0.5 MG PO TABS: 0.5000 mg | ORAL_TABLET | Freq: Every day | ORAL | 3 refills | Status: DC

## 2017-02-28 NOTE — Telephone Encounter (Signed)
Patient called wanting a refill on her Estradiol. I don't see where we have refilled it before. Is this ok to refill under your name. Please Advise.

## 2017-02-28 NOTE — Telephone Encounter (Signed)
Let's reduce to estradiol 0.5mg  po daily for osteoporosis prevention purpose--ok to fill this way with 3 refills.    I will continue to try to taper her down over time given she is 71.

## 2017-02-28 NOTE — Telephone Encounter (Signed)
Patient notified and Rx faxed to pharmacy.  

## 2017-03-14 ENCOUNTER — Other Ambulatory Visit: Payer: Self-pay | Admitting: Internal Medicine

## 2017-03-31 ENCOUNTER — Other Ambulatory Visit: Payer: Self-pay | Admitting: Internal Medicine

## 2017-04-10 ENCOUNTER — Other Ambulatory Visit: Payer: Self-pay | Admitting: Internal Medicine

## 2017-04-13 ENCOUNTER — Other Ambulatory Visit: Payer: Self-pay | Admitting: Internal Medicine

## 2017-04-14 ENCOUNTER — Other Ambulatory Visit: Payer: Self-pay | Admitting: Internal Medicine

## 2017-05-09 ENCOUNTER — Other Ambulatory Visit: Payer: Medicare Other

## 2017-05-09 DIAGNOSIS — I10 Essential (primary) hypertension: Secondary | ICD-10-CM | POA: Diagnosis not present

## 2017-05-09 DIAGNOSIS — R7303 Prediabetes: Secondary | ICD-10-CM

## 2017-05-09 DIAGNOSIS — E782 Mixed hyperlipidemia: Secondary | ICD-10-CM | POA: Diagnosis not present

## 2017-05-09 LAB — BASIC METABOLIC PANEL
BUN: 16 mg/dL (ref 7–25)
CO2: 25 mmol/L (ref 20–32)
Calcium: 9.4 mg/dL (ref 8.6–10.4)
Chloride: 104 mmol/L (ref 98–110)
Creat: 0.84 mg/dL (ref 0.60–0.93)
Glucose, Bld: 115 mg/dL — ABNORMAL HIGH (ref 65–99)
Potassium: 4.5 mmol/L (ref 3.5–5.3)
Sodium: 139 mmol/L (ref 135–146)

## 2017-05-09 LAB — LIPID PANEL
Cholesterol: 177 mg/dL (ref <200)
HDL: 62 mg/dL (ref >50)
LDL Cholesterol: 94 mg/dL (ref <100)
Total CHOL/HDL Ratio: 2.9 Ratio (ref <5.0)
Triglycerides: 103 mg/dL (ref <150)
VLDL: 21 mg/dL (ref <30)

## 2017-05-10 LAB — CBC WITH DIFFERENTIAL/PLATELET
Basophils Absolute: 0 cells/uL (ref 0–200)
Basophils Relative: 0 %
Eosinophils Absolute: 0 cells/uL — ABNORMAL LOW (ref 15–500)
Eosinophils Relative: 0 %
HCT: 39.6 % (ref 35.0–45.0)
Hemoglobin: 13.2 g/dL (ref 11.7–15.5)
Lymphocytes Relative: 33 %
Lymphs Abs: 1980 cells/uL (ref 850–3900)
MCH: 31.4 pg (ref 27.0–33.0)
MCHC: 33.3 g/dL (ref 32.0–36.0)
MCV: 94.3 fL (ref 80.0–100.0)
MPV: 8.9 fL (ref 7.5–12.5)
Monocytes Absolute: 480 cells/uL (ref 200–950)
Monocytes Relative: 8 %
Neutro Abs: 3540 cells/uL (ref 1500–7800)
Neutrophils Relative %: 59 %
Platelets: 272 10*3/uL (ref 140–400)
RBC: 4.2 MIL/uL (ref 3.80–5.10)
RDW: 14.5 % (ref 11.0–15.0)
WBC: 6 10*3/uL (ref 3.8–10.8)

## 2017-05-10 LAB — HEMOGLOBIN A1C
Hgb A1c MFr Bld: 5.6 % (ref <5.7)
Mean Plasma Glucose: 114 mg/dL

## 2017-05-11 ENCOUNTER — Encounter: Payer: Self-pay | Admitting: Internal Medicine

## 2017-05-11 ENCOUNTER — Ambulatory Visit (INDEPENDENT_AMBULATORY_CARE_PROVIDER_SITE_OTHER): Payer: Medicare Other | Admitting: Internal Medicine

## 2017-05-11 VITALS — BP 120/80 | HR 80 | Temp 98.4°F | Wt 143.0 lb

## 2017-05-11 DIAGNOSIS — E782 Mixed hyperlipidemia: Secondary | ICD-10-CM | POA: Diagnosis not present

## 2017-05-11 DIAGNOSIS — I10 Essential (primary) hypertension: Secondary | ICD-10-CM

## 2017-05-11 DIAGNOSIS — E039 Hypothyroidism, unspecified: Secondary | ICD-10-CM | POA: Diagnosis not present

## 2017-05-11 DIAGNOSIS — R7303 Prediabetes: Secondary | ICD-10-CM | POA: Diagnosis not present

## 2017-05-11 DIAGNOSIS — F5101 Primary insomnia: Secondary | ICD-10-CM | POA: Diagnosis not present

## 2017-05-11 DIAGNOSIS — F332 Major depressive disorder, recurrent severe without psychotic features: Secondary | ICD-10-CM
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
## 2017-05-14 ENCOUNTER — Other Ambulatory Visit: Payer: Self-pay | Admitting: Internal Medicine

## 2017-05-15 ENCOUNTER — Telehealth: Payer: Self-pay | Admitting: *Deleted

## 2017-05-15 ENCOUNTER — Other Ambulatory Visit: Payer: Self-pay | Admitting: Internal Medicine

## 2017-05-15 NOTE — Telephone Encounter (Signed)
Patient called requesting refill on her Alprazolam and Zolpidem. Called pharmacy and spoke with Joe.

## 2017-05-19 DIAGNOSIS — M810 Age-related osteoporosis without current pathological fracture: Secondary | ICD-10-CM | POA: Insufficient documentation

## 2017-05-26 ENCOUNTER — Other Ambulatory Visit: Payer: Self-pay | Admitting: Internal Medicine

## 2017-06-14 ENCOUNTER — Other Ambulatory Visit: Payer: Self-pay | Admitting: Internal Medicine

## 2017-06-15 ENCOUNTER — Other Ambulatory Visit: Payer: Self-pay | Admitting: Internal Medicine

## 2017-06-17 ENCOUNTER — Other Ambulatory Visit: Payer: Self-pay | Admitting: Internal Medicine

## 2017-06-19 ENCOUNTER — Other Ambulatory Visit: Payer: Self-pay | Admitting: *Deleted

## 2017-06-19 MED ORDER — ALPRAZOLAM 1 MG PO TABS: ORAL_TABLET | ORAL | 0 refills | Status: DC

## 2017-06-19 NOTE — Telephone Encounter (Signed)
[facility] 

## 2017-07-17 ENCOUNTER — Other Ambulatory Visit: Payer: Self-pay | Admitting: Internal Medicine

## 2017-07-19 DIAGNOSIS — H5201 Hypermetropia, right eye: Secondary | ICD-10-CM | POA: Diagnosis not present

## 2017-07-19 DIAGNOSIS — H5053 Vertical heterophoria: Secondary | ICD-10-CM | POA: Diagnosis not present

## 2017-07-19 DIAGNOSIS — H52223 Regular astigmatism, bilateral: Secondary | ICD-10-CM | POA: Diagnosis not present

## 2017-07-19 DIAGNOSIS — H5212 Myopia, left eye: Secondary | ICD-10-CM | POA: Diagnosis not present

## 2017-07-19 DIAGNOSIS — H40023 Open angle with borderline findings, high risk, bilateral: Secondary | ICD-10-CM | POA: Diagnosis not present

## 2017-07-23 ENCOUNTER — Other Ambulatory Visit: Payer: Self-pay | Admitting: Internal Medicine

## 2017-08-07 ENCOUNTER — Telehealth: Payer: Self-pay | Admitting: *Deleted

## 2017-08-07 NOTE — Telephone Encounter (Signed)
Spoke with patient and she want to cancel the order for the bone density. Order canceled

## 2017-08-07 NOTE — Telephone Encounter (Signed)
-----   Message from Gayland Curry, DO sent at 08/04/2017 11:34 AM EDT ----- Patient never went for bone density or mammogram ordered.  Please call her and document in a phone encounter her reasoning so it's obvious in her chart.  Thanks.  ----- Message ----- From: SYSTEM Sent: 08/04/2017  12:05 AM To: Gayland Curry, DO

## 2017-08-09 ENCOUNTER — Other Ambulatory Visit: Payer: Medicare Other

## 2017-08-09 DIAGNOSIS — I10 Essential (primary) hypertension: Secondary | ICD-10-CM | POA: Diagnosis not present

## 2017-08-09 DIAGNOSIS — R7303 Prediabetes: Secondary | ICD-10-CM | POA: Diagnosis not present

## 2017-08-09 DIAGNOSIS — E039 Hypothyroidism, unspecified: Secondary | ICD-10-CM | POA: Diagnosis not present

## 2017-08-10 LAB — BASIC METABOLIC PANEL
BUN: 13 mg/dL (ref 7–25)
CO2: 24 mmol/L (ref 20–32)
Calcium: 8.8 mg/dL (ref 8.6–10.4)
Chloride: 103 mmol/L (ref 98–110)
Creat: 0.83 mg/dL (ref 0.60–0.93)
Glucose, Bld: 103 mg/dL — ABNORMAL HIGH (ref 65–99)
Potassium: 4.1 mmol/L (ref 3.5–5.3)
Sodium: 135 mmol/L (ref 135–146)

## 2017-08-10 LAB — HEMOGLOBIN A1C
Hgb A1c MFr Bld: 5.7 % of total Hgb — ABNORMAL HIGH (ref <5.7)
Mean Plasma Glucose: 117 (calc)
eAG (mmol/L): 6.5 (calc)

## 2017-08-10 LAB — TSH: TSH: 4.02 mIU/L (ref 0.40–4.50)

## 2017-08-11 ENCOUNTER — Telehealth: Payer: Self-pay

## 2017-08-11 NOTE — Telephone Encounter (Signed)
Spoke with patient and she verbalized understanding her lab results.

## 2017-08-14 ENCOUNTER — Encounter: Payer: Self-pay | Admitting: Internal Medicine

## 2017-08-14 ENCOUNTER — Ambulatory Visit (INDEPENDENT_AMBULATORY_CARE_PROVIDER_SITE_OTHER): Payer: Medicare Other | Admitting: Internal Medicine

## 2017-08-14 VITALS — BP 118/70 | HR 78 | Temp 98.1°F | Wt 141.0 lb

## 2017-08-14 DIAGNOSIS — F5104 Psychophysiologic insomnia: Secondary | ICD-10-CM | POA: Diagnosis not present

## 2017-08-14 DIAGNOSIS — I10 Essential (primary) hypertension: Secondary | ICD-10-CM

## 2017-08-14 DIAGNOSIS — Z23 Encounter for immunization: Secondary | ICD-10-CM

## 2017-08-14 DIAGNOSIS — F329 Major depressive disorder, single episode, unspecified: Secondary | ICD-10-CM | POA: Diagnosis not present

## 2017-08-14 DIAGNOSIS — F332 Major depressive disorder, recurrent severe without psychotic features: Secondary | ICD-10-CM | POA: Diagnosis not present

## 2017-08-14 DIAGNOSIS — F419 Anxiety disorder, unspecified: Secondary | ICD-10-CM

## 2017-08-14 DIAGNOSIS — F32A Depression, unspecified: Secondary | ICD-10-CM

## 2017-08-14 DIAGNOSIS — E039 Hypothyroidism, unspecified: Secondary | ICD-10-CM | POA: Diagnosis not present

## 2017-08-14 DIAGNOSIS — E782 Mixed hyperlipidemia: Secondary | ICD-10-CM | POA: Diagnosis not present

## 2017-08-14 DIAGNOSIS — R7303 Prediabetes: Secondary | ICD-10-CM

## 2017-08-14 MED ORDER — BUPROPION HCL ER (XL) 300 MG PO TB24: 300.0000 mg | ORAL_TABLET | Freq: Every day | ORAL | 3 refills | Status: DC

## 2017-08-14 MED ORDER — ZOLPIDEM TARTRATE 5 MG PO TABS: 5.0000 mg | ORAL_TABLET | Freq: Every day | ORAL | 0 refills | Status: DC

## 2017-08-14 NOTE — Progress Notes (Signed)
Location:  [facility] clinic Provider:  Neko Boyajian L. Mariea Clonts, D.O., C.M.D.  Code Status: DNR Goals of Care:  Advanced Directives 08/14/2017  Does Patient Have a Medical Advance Directive? No  Type of Advance Directive -  Does patient want to make changes to medical advance directive? -  Copy of [place] in Chart? -  Would patient like information on creating a medical advance directive? No - Patient declined   Chief Complaint  Patient presents with  . Medical Management of Chronic Issues    9mth follow-up, discuss labs    HPI: Patient is a 71 y.o. female seen today for medical management of chronic diseases and review labs.      Having expensive plumbing problems.   5/11, she was a great grandma.  He's in [place], [place].    Getting heartburn.  Taking tums.  Says it waits until she is lying down in bed.  Doesn't get when awake in the daytime.    Needs zolpidem and bupropion refilled.   Says she lost weight, but gained it back.  Says she's been eating better.  Has not been going to the gym for various excuses she says.  Had been down to 137 lbs (down 6 lbs from August when 143 lbs) but back up to 141 lbs.  Bunions were worsening so not walking 4 miles on the treadmill.  Has to walk a long distance at [place].  Says she keeps getting accosted there and people begging for money.  Was asking for a handicapped plaquard due to this.    DMII:  Well controlled.  Not exercising like she should.  Hyperlipidemia:  Has been at goal with current regimen.    Insomnia:  Ongoing, sleep only if takes [place].    Past Medical History:  Diagnosis Date  . Anxiety   . Cataract    BILATERAL REMOVAL  . Chronic kidney disease    KIDNEY STONES 2 TIMES IN 20 YEARS   . Colon polyp    Tubular Adenoma  . Colon polyp, hyperplastic   . DDD (degenerative disc disease), lumbar   . Diverticulosis   . Flushing   . Hyperlipidemia   . Hypertension   . Hypertonicity of bladder   . Insomnia,  unspecified   . Internal hemorrhoid   . Other abnormal blood chemistry   . Sigmoid polyp   . Thyroid disease     Past Surgical History:  Procedure Laterality Date  . [place]   has bilateral silicone implants  . CHOLECYSTECTOMY    . COLONOSCOPY  11/24/2015   Dr. Hilarie Fredrickson  . [place]  . POLYPECTOMY    . ROTATOR CUFF REPAIR  2013    Allergies  Allergen Reactions  . Latex Swelling    Outpatient Encounter Medications as of 08/14/2017  Medication Sig  . ALPRAZolam (XANAX) 1 MG tablet TAKE 2 TABLETS BY MOUTH ONCE DAILY AT BEDTIME AS NEEDED FOR ANXIETY  . aspirin EC 81 MG tablet Take 1 tablet (81 mg total) by mouth daily.  Marland Kitchen buPROPion (WELLBUTRIN XL) 300 MG 24 hr tablet TAKE ONE TABLET BY MOUTH ONCE DAILY  . estradiol (ESTRACE) 0.5 MG tablet Take 1 tablet (0.5 mg total) by mouth daily.  Marland Kitchen levothyroxine (SYNTHROID, LEVOTHROID) 88 MCG tablet TAKE ONE TABLET BY MOUTH ONCE DAILY  . losartan (COZAAR) 25 MG tablet TAKE ONE TABLET BY MOUTH ONCE DAILY  . medroxyPROGESTERone (PROVERA) 2.5 MG tablet Take 2.5 mg by  mouth daily.  . polyethylene glycol powder (GLYCOLAX/MIRALAX) powder TAKE ONE CAPFUL BY MOUTH TWICE DAILY AS NEEDED  . simvastatin (ZOCOR) 40 MG tablet TAKE ONE TABLET BY MOUTH ONCE DAILY FOR CHOLESTEROL  . zolpidem (AMBIEN) 5 MG tablet TAKE 1 TABLET BY MOUTH ONCE DAILY AT BEDTIME   No facility-administered encounter medications on file as of 08/14/2017.     Review of Systems:  Review of Systems  Constitutional: Negative for chills, fever and malaise/fatigue.  HENT: Negative for congestion and hearing loss.   Eyes: Negative for blurred vision.       Glasses  Respiratory: Negative for cough and shortness of breath.   Cardiovascular: Negative for chest pain and palpitations.  Gastrointestinal: Positive for heartburn. Negative for abdominal pain, constipation, diarrhea, nausea and vomiting.  Genitourinary: Negative for dysuria.    Musculoskeletal: Negative for falls.  Skin: Positive for rash.  Neurological: Negative for dizziness, loss of consciousness and weakness.  Psychiatric/Behavioral: Positive for depression. Negative for memory loss. The patient is nervous/anxious and has insomnia.     Health Maintenance  Topic Date Due  . INFLUENZA VACCINE  05/03/2017  . TETANUS/TDAP  10/03/2018  . COLONOSCOPY  11/23/2018  . DEXA SCAN  Completed  . Hepatitis C Screening  Completed  . PNA vac Low Risk Adult  Completed    Physical Exam: Vitals:   08/14/17 1510  BP: 118/70  Pulse: 78  Temp: 98.1 F (36.7 C)  TempSrc: Oral  SpO2: 97%  Weight: 141 lb (64 kg)   Body mass index is 40.46 kg/m. Physical Exam  Constitutional: She is oriented to person, place, and time. She appears well-developed and well-nourished.  HENT:  Head: Normocephalic and atraumatic.  Cardiovascular: Normal rate, regular rhythm, normal heart sounds and intact distal pulses.  Pulmonary/Chest: Effort normal and breath sounds normal. No respiratory distress.  Abdominal: Bowel sounds are normal.  Musculoskeletal: Normal range of motion.  Neurological: She is alert and oriented to person, place, and time.  Skin: Skin is warm and dry. Capillary refill takes less than 2 seconds. Rash noted. There is pallor.  Psychiatric: She has a normal mood and affect.    Labs reviewed: Basic Metabolic Panel: Recent Labs    09/06/16 1331 05/09/17 1340 08/09/17 1403  NA 140 139 135  K 4.3 4.5 4.1  CL 106 104 103  CO2 24 25 24   GLUCOSE 88 115* 103*  BUN 12 16 13   CREATININE 0.81 0.84 0.83  CALCIUM 8.8 9.4 8.8  TSH 4.46  --  4.02   Liver Function Tests: Recent Labs    09/06/16 1331  AST 12  ALT 16  ALKPHOS 54  BILITOT 0.2  PROT 7.1  ALBUMIN 3.7   No results for input(s): LIPASE, AMYLASE in the last 8760 hours. No results for input(s): AMMONIA in the last 8760 hours. CBC: Recent Labs    09/06/16 1331 05/09/17 1340  WBC 4.9 6.0   NEUTROABS 2,009 3,540  HGB 12.4 13.2  HCT 37.4 39.6  MCV 92.3 94.3  PLT 234 272   Lipid Panel: Recent Labs    09/06/16 1331 05/09/17 1340  CHOL 165 177  HDL 58 62  LDLCALC 93 94  TRIG 70 103  CHOLHDL 2.8 2.9   Lab Results  Component Value Date   HGBA1C 5.7 (H) 08/09/2017    Assessment/Plan 1. Anxiety and depression - cont current regimen, has not done well with attempts to wean xanax - buPROPion (WELLBUTRIN XL) 300 MG 24 hr tablet; Take 1  tablet (300 mg total) daily by mouth.  Dispense: 90 tablet; Refill: 3  2. Psychophysiological insomnia - did not tolerate coming off ambien, cont low dose - zolpidem (AMBIEN) 5 MG tablet; Take 1 tablet (5 mg total) at bedtime by mouth.  Dispense: 30 tablet; Refill: 0  3. Essential hypertension, benign -bp well controlled on current regimen, cont same and monitor  4. Hypothyroidism, unspecified type -TSH wnl, cont current levothyroxine and monitor annually  5. Mixed hyperlipidemia -lipids at goal with current regimen, cont same and monitor, attempt to increase walking or other exercise for weight loss and LDL lowering  6. Prediabetes -cont to work on diet and exercise, cont baby asa and arb  7. Severe episode of recurrent major depressive disorder, without psychotic features ([place]) -doing better on wellbutrin, cont same  8. Need for influenza vaccination - Flu vaccine HIGH DOSE PF (Fluzone High dose)  Labs/tests ordered:   Orders Placed This Encounter  Procedures  . Flu vaccine HIGH DOSE PF (Fluzone High dose)  . CBC with Differential/Platelet    Standing Status:   Future    Standing Expiration Date:   04/13/2018  . COMPLETE METABOLIC PANEL WITH GFR    Standing Status:   Future    Standing Expiration Date:   04/13/2018  . Hemoglobin A1c    Standing Status:   Future    Standing Expiration Date:   04/13/2018  . Lipid panel    Standing Status:   Future    Standing Expiration Date:   04/13/2018    Next appt:  4 mos med  mgt   Rashi Giuliani L. Akaysha Cobern, D.O. [facility] [address],  [address] Cell Phone (Mon-Fri 8am-5pm):  [phone] On Call:  [phone] & follow prompts after 5pm & weekends Office Phone:  [phone] Office Fax:  [phone]

## 2017-08-21 DIAGNOSIS — Z9849 Cataract extraction status, unspecified eye: Secondary | ICD-10-CM | POA: Diagnosis not present

## 2017-08-21 DIAGNOSIS — H40023 Open angle with borderline findings, high risk, bilateral: Secondary | ICD-10-CM | POA: Diagnosis not present

## 2017-08-21 DIAGNOSIS — Z961 Presence of intraocular lens: Secondary | ICD-10-CM | POA: Diagnosis not present

## 2017-08-21 DIAGNOSIS — H11423 Conjunctival edema, bilateral: Secondary | ICD-10-CM | POA: Diagnosis not present

## 2017-08-21 DIAGNOSIS — H04123 Dry eye syndrome of bilateral lacrimal glands: Secondary | ICD-10-CM | POA: Diagnosis not present

## 2017-08-21 DIAGNOSIS — H5201 Hypermetropia, right eye: Secondary | ICD-10-CM | POA: Diagnosis not present

## 2017-08-21 DIAGNOSIS — H11153 Pinguecula, bilateral: Secondary | ICD-10-CM | POA: Diagnosis not present

## 2017-08-21 DIAGNOSIS — H18413 Arcus senilis, bilateral: Secondary | ICD-10-CM | POA: Diagnosis not present

## 2017-08-21 DIAGNOSIS — H40003 Preglaucoma, unspecified, bilateral: Secondary | ICD-10-CM | POA: Diagnosis not present

## 2017-08-21 DIAGNOSIS — H52223 Regular astigmatism, bilateral: Secondary | ICD-10-CM | POA: Diagnosis not present

## 2017-08-25 ENCOUNTER — Other Ambulatory Visit: Payer: Self-pay | Admitting: Internal Medicine

## 2017-08-28 ENCOUNTER — Other Ambulatory Visit: Payer: Self-pay | Admitting: Internal Medicine

## 2017-08-28 NOTE — Telephone Encounter (Signed)
rx called into pharmacy

## 2017-09-13 ENCOUNTER — Telehealth: Payer: Self-pay

## 2017-09-13 ENCOUNTER — Other Ambulatory Visit: Payer: Self-pay | Admitting: Internal Medicine

## 2017-09-13 DIAGNOSIS — F5104 Psychophysiologic insomnia: Secondary | ICD-10-CM

## 2017-09-13 NOTE — Telephone Encounter (Signed)
Called patient to try to schedule AWV in the office. No answer-left voicemail to call back.    

## 2017-09-13 NOTE — Telephone Encounter (Signed)
Last filled 09/13/2017

## 2017-09-29 ENCOUNTER — Ambulatory Visit: Payer: Self-pay

## 2017-10-09 ENCOUNTER — Other Ambulatory Visit: Payer: Self-pay | Admitting: Internal Medicine

## 2017-10-11 ENCOUNTER — Ambulatory Visit (INDEPENDENT_AMBULATORY_CARE_PROVIDER_SITE_OTHER): Payer: Medicare Other

## 2017-10-11 VITALS — BP 126/64 | HR 90 | Temp 98.6°F | Ht <= 58 in | Wt 140.0 lb

## 2017-10-11 DIAGNOSIS — Z0000 Encounter for general adult medical examination without abnormal findings: Secondary | ICD-10-CM | POA: Diagnosis not present

## 2017-10-11 MED ORDER — ZOSTER VAC RECOMB ADJUVANTED 50 MCG/0.5ML IM SUSR: 0.5000 mL | Freq: Once | INTRAMUSCULAR | 1 refills | Status: AC

## 2017-10-11 NOTE — Patient Instructions (Signed)
[name] , Thank you for taking time to come for your Medicare Wellness Visit. I appreciate your ongoing commitment to your health goals. Please review the following plan we discussed and let me know if I can assist you in the future.   Screening recommendations/referrals: Colonoscopy up to date, due 11/23/2025 Mammogram due Bone Density up to date Recommended yearly ophthalmology/optometry visit for glaucoma screening and checkup Recommended yearly dental visit for hygiene and checkup  Vaccinations: Influenza vaccine up to date. Due 2019 fall season Pneumococcal vaccine up to date Tdap vaccine up to date. Due 10/03/2018 Shingles vaccine due, prescription sent to pharmacy  Advanced directives: Please bring [place] a copy of your living will and health care power of attorney  Conditions/risks identified: none  Next appointment: Dr. Mariea Clonts 12/21/2017 @ 1pm             Tyson Dense, RN 10/12/2018 @ 12:45pm   Preventive Care 74 Years and Older, Female Preventive care refers to lifestyle choices and visits with your health care provider that can promote health and wellness. What does preventive care include?  A yearly physical exam. This is also called an annual well check.  Dental exams once or twice a year.  Routine eye exams. Ask your health care provider how often you should have your eyes checked.  Personal lifestyle choices, including:  Daily care of your teeth and gums.  Regular physical activity.  Eating a healthy diet.  Avoiding tobacco and drug use.  Limiting alcohol use.  Practicing safe sex.  Taking low-dose aspirin every day.  Taking vitamin and mineral supplements as recommended by your health care provider. What happens during an annual well check? The services and screenings done by your health care provider during your annual well check will depend on your age, overall health, lifestyle risk factors, and family history of disease. Counseling  Your health care  provider may ask you questions about your:  Alcohol use.  Tobacco use.  Drug use.  Emotional well-being.  Home and relationship well-being.  Sexual activity.  Eating habits.  History of falls.  Memory and ability to understand (cognition).  Work and work Statistician.  Reproductive health. Screening  You may have the following tests or measurements:  Height, weight, and BMI.  Blood pressure.  Lipid and cholesterol levels. These may be checked every 5 years, or more frequently if you are over 55 years old.  Skin check.  Lung cancer screening. You may have this screening every year starting at age 74 if you have a 30-pack-year history of smoking and currently smoke or have quit within the past 15 years.  Fecal occult blood test (FOBT) of the stool. You may have this test every year starting at age 16.  Flexible sigmoidoscopy or colonoscopy. You may have a sigmoidoscopy every 5 years or a colonoscopy every 10 years starting at age 32.  Hepatitis C blood test.  Hepatitis B blood test.  Sexually transmitted disease (STD) testing.  Diabetes screening. This is done by checking your blood sugar (glucose) after you have not eaten for a while (fasting). You may have this done every 1-3 years.  Bone density scan. This is done to screen for osteoporosis. You may have this done starting at age 69.  Mammogram. This may be done every 1-2 years. Talk to your health care provider about how often you should have regular mammograms. Talk with your health care provider about your test results, treatment options, and if necessary, the need for more  tests. Vaccines  Your health care provider may recommend certain vaccines, such as:  Influenza vaccine. This is recommended every year.  Tetanus, diphtheria, and acellular pertussis (Tdap, Td) vaccine. You may need a Td booster every 10 years.  Zoster vaccine. You may need this after age 61.  Pneumococcal 13-valent conjugate (PCV13)  vaccine. One dose is recommended after age 38.  Pneumococcal polysaccharide (PPSV23) vaccine. One dose is recommended after age 74. Talk to your health care provider about which screenings and vaccines you need and how often you need them. This information is not intended to replace advice given to you by your health care provider. Make sure you discuss any questions you have with your health care provider. Document Released: 10/16/2015 Document Revised: 06/08/2016 Document Reviewed: 07/21/2015 Elsevier Interactive Patient Education  2017 [place] Prevention in the Home Falls can cause injuries. They can happen to people of all ages. There are many things you can do to make your home safe and to help prevent falls. What can I do on the outside of my home?  Regularly fix the edges of walkways and driveways and fix any cracks.  Remove anything that might make you trip as you walk through a door, such as a raised step or threshold.  Trim any bushes or trees on the path to your home.  Use bright outdoor lighting.  Clear any walking paths of anything that might make someone trip, such as rocks or tools.  Regularly check to see if handrails are loose or broken. Make sure that both sides of any steps have handrails.  Any raised decks and porches should have guardrails on the edges.  Have any leaves, snow, or ice cleared regularly.  Use sand or salt on walking paths during winter.  Clean up any spills in your garage right away. This includes oil or grease spills. What can I do in the bathroom?  Use night lights.  Install grab bars by the toilet and in the tub and shower. Do not use towel bars as grab bars.  Use non-skid mats or decals in the tub or shower.  If you need to sit down in the shower, use a plastic, non-slip stool.  Keep the floor dry. Clean up any water that spills on the floor as soon as it happens.  Remove soap buildup in the tub or shower  regularly.  Attach bath mats securely with double-sided non-slip rug tape.  Do not have throw rugs and other things on the floor that can make you trip. What can I do in the bedroom?  Use night lights.  Make sure that you have a light by your bed that is easy to reach.  Do not use any sheets or blankets that are too big for your bed. They should not hang down onto the floor.  Have a firm chair that has side arms. You can use this for support while you get dressed.  Do not have throw rugs and other things on the floor that can make you trip. What can I do in the kitchen?  Clean up any spills right away.  Avoid walking on wet floors.  Keep items that you use a lot in easy-to-reach places.  If you need to reach something above you, use a strong step stool that has a grab bar.  Keep electrical cords out of the way.  Do not use floor polish or wax that makes floors slippery. If you must use wax, use non-skid floor  wax.  Do not have throw rugs and other things on the floor that can make you trip. What can I do with my stairs?  Do not leave any items on the stairs.  Make sure that there are handrails on both sides of the stairs and use them. Fix handrails that are broken or loose. Make sure that handrails are as long as the stairways.  Check any carpeting to make sure that it is firmly attached to the stairs. Fix any carpet that is loose or worn.  Avoid having throw rugs at the top or bottom of the stairs. If you do have throw rugs, attach them to the floor with carpet tape.  Make sure that you have a light switch at the top of the stairs and the bottom of the stairs. If you do not have them, ask someone to add them for you. What else can I do to help prevent falls?  Wear shoes that:  Do not have high heels.  Have rubber bottoms.  Are comfortable and fit you well.  Are closed at the toe. Do not wear sandals.  If you use a stepladder:  Make sure that it is fully  opened. Do not climb a closed stepladder.  Make sure that both sides of the stepladder are locked into place.  Ask someone to hold it for you, if possible.  Clearly mark and make sure that you can see:  Any grab bars or handrails.  First and last steps.  Where the edge of each step is.  Use tools that help you move around (mobility aids) if they are needed. These include:  Canes.  Walkers.  Scooters.  Crutches.  Turn on the lights when you go into a dark area. Replace any light bulbs as soon as they burn out.  Set up your furniture so you have a clear path. Avoid moving your furniture around.  If any of your floors are uneven, fix them.  If there are any pets around you, be aware of where they are.  Review your medicines with your doctor. Some medicines can make you feel dizzy. This can increase your chance of falling. Ask your doctor what other things that you can do to help prevent falls. This information is not intended to replace advice given to you by your health care provider. Make sure you discuss any questions you have with your health care provider. Document Released: 07/16/2009 Document Revised: 02/25/2016 Document Reviewed: 10/24/2014 Elsevier Interactive Patient Education  2017 [employer].

## 2017-10-11 NOTE — Progress Notes (Signed)
Subjective:   Kelli [name] is a 72 y.o. female who presents for Medicare Annual (Subsequent) preventive examination.  Last AWV-09/08/2016    Objective:     Vitals: BP 126/64 (BP Location: Left Arm, Patient Position: Sitting)   Pulse 90   Temp 98.6 F (37 C) (Oral)   Ht 4\' 2"  (1.27 m)   Wt 140 lb (63.5 kg)   SpO2 97%   BMI 39.37 kg/m   Body mass index is 39.37 kg/m.  Advanced Directives 10/11/2017 08/14/2017 09/08/2016 05/30/2016 02/25/2016 01/07/2016 11/30/2015  Does Patient Have a Medical Advance Directive? Yes No Yes No No No No  Type of Paramedic of [place];Living will - [place]  Does patient want to make changes to medical advance directive? No - Patient declined - - - - - -  Copy of [place] in Chart? No - copy requested - No - copy requested - - - -  Would patient like information on creating a medical advance directive? No - Patient declined No - Patient declined - Yes - Scientist, clinical (histocompatibility and immunogenetics) given - No - patient declined information -    Tobacco Social History   Tobacco Use  Smoking Status Former Smoker  . Last attempt to quit: 11/30/1995  . Years since quitting: 21.8  Smokeless Tobacco Never Used     Counseling given: Not Answered   Clinical Intake:  Pre-visit preparation completed: No  Pain : No/denies pain     Nutritional Status: BMI > 30  Obese Nutritional Risks: None Diabetes: No  How often do you need to have someone help you when you read instructions, pamphlets, or other written materials from your doctor or pharmacy?: 1 - Never What is the last grade level you completed in school?: 12th grade  Interpreter Needed?: No  Information entered by :: Tyson Dense, RN  Past Medical History:  Diagnosis Date  . Anxiety   . Cataract    BILATERAL REMOVAL  . Chronic kidney disease    KIDNEY STONES 2 TIMES IN 20 YEARS   . Colon polyp    Tubular Adenoma  . Colon polyp,  hyperplastic   . DDD (degenerative disc disease), lumbar   . Diverticulosis   . Flushing   . Hyperlipidemia   . Hypertension   . Hypertonicity of bladder   . Insomnia, unspecified   . Internal hemorrhoid   . Other abnormal blood chemistry   . Sigmoid polyp   . Thyroid disease    Past Surgical History:  Procedure Laterality Date  . Eugenio Saenz   has bilateral silicone implants  . CHOLECYSTECTOMY    . COLONOSCOPY  11/24/2015   Dr. Hilarie Fredrickson  . [place]  . POLYPECTOMY    . ROTATOR CUFF REPAIR  2013   Family History  Problem Relation Age of Onset  . Cancer Mother        lung  . Colon polyps Mother   . Stroke Father   . Colon cancer Neg Hx   . Rectal cancer Neg Hx   . Stomach cancer Neg Hx   . Esophageal cancer Neg Hx    Social History   Socioeconomic History  . Marital status: Widowed    Spouse name: Not on file  . Number of children: Not on file  . Years of education: Not on file  . Highest education level: Not on file  Social Needs  .  Financial resource strain: Not hard at all  . Food insecurity - worry: Never true  . Food insecurity - inability: Never true  . Transportation needs - medical: No  . Transportation needs - non-medical: No  Occupational History  . Occupation: retired  Tobacco Use  . Smoking status: Former Smoker    Last attempt to quit: 11/30/1995    Years since quitting: 21.8  . Smokeless tobacco: Never Used  Substance and Sexual Activity  . Alcohol use: No    Alcohol/week: 0.0 oz  . Drug use: No  . Sexual activity: No  Other Topics Concern  . Not on file  Social History Narrative   Widowed   Former [place] stopped   Alcohol once a year   [place] 2-3 times a week goes to gym    Outpatient Encounter Medications as of 10/11/2017  Medication Sig  . ALPRAZolam (XANAX) 1 MG tablet TAKE 2 TABLETS BY MOUTH AT BEDTIME AS NEEDED FOR ANXIETY  . aspirin EC 81 MG tablet Take 1 tablet (81 mg total) by  mouth daily.  Marland Kitchen buPROPion (WELLBUTRIN XL) 300 MG 24 hr tablet Take 1 tablet (300 mg total) daily by mouth.  . estradiol (ESTRACE) 0.5 MG tablet Take 1 tablet (0.5 mg total) by mouth daily.  Marland Kitchen levothyroxine (SYNTHROID, LEVOTHROID) 88 MCG tablet TAKE ONE TABLET BY MOUTH ONCE DAILY  . losartan (COZAAR) 25 MG tablet TAKE ONE TABLET BY MOUTH ONCE DAILY  . medroxyPROGESTERone (PROVERA) 2.5 MG tablet Take 2.5 mg by mouth daily.  . polyethylene glycol powder (GLYCOLAX/MIRALAX) powder TAKE ONE CAPFUL BY MOUTH TWICE DAILY AS NEEDED  . simvastatin (ZOCOR) 40 MG tablet TAKE ONE TABLET BY MOUTH ONCE DAILY FOR CHOLESTEROL  . zolpidem (AMBIEN) 5 MG tablet TAKE 1 TABLET BY MOUTH AT BEDTIME   No facility-administered encounter medications on file as of 10/11/2017.     Activities of Daily Living In your present state of health, do you have any difficulty performing the following activities: 10/11/2017  Hearing? N  Vision? N  Difficulty concentrating or making decisions? Y  Walking or climbing stairs? N  Dressing or bathing? N  Doing errands, shopping? N  Preparing Food and eating ? N  Using the Toilet? N  In the past six months, have you accidently leaked urine? Y  Do you have problems with loss of bowel control? N  Managing your Medications? N  Managing your Finances? N  Housekeeping or managing your Housekeeping? N  Some recent data might be hidden    Patient Care Team: Gayland Curry, DO as PCP - General (Geriatric Medicine)    Assessment:   This is a routine wellness examination for [place].  Exercise Activities and Dietary recommendations Current Exercise Habits: The patient does not participate in regular exercise at present, Exercise limited by: None identified  Goals    . Exercise 3x per week (30 min per time)     Starting 09/08/16, I will maintain my current exercise routine of 2-3 times per week at the gym. I would like weigh 125lb    . Exercise 3x per week (30 min per time)      Patient would like to go back to the gym 2-3 times a week       Fall Risk Fall Risk  10/11/2017 08/14/2017 05/11/2017 01/09/2017 09/08/2016  Falls in the past year? No No No No No   Is the patient's home free of loose throw rugs in walkways, pet beds, electrical cords, etc?  yes      Grab bars in the bathroom? No      Handrails on the stairs?   yes      Adequate lighting?   yes  Timed Get Up and Go performed: 15 seconds, fall risk  Depression Screen PHQ 2/9 Scores 10/11/2017 08/14/2017 05/11/2017 01/09/2017  PHQ - 2 Score 0 0 0 0  PHQ- 9 Score - - - -     Cognitive Function MMSE - Mini Mental State Exam 10/11/2017 09/08/2016 12/26/2013  Orientation to time 5 5 5   Orientation to Place 5 5 5   Registration 3 3 3   Attention/ Calculation 5 3 5   Recall 3 3 2   Language- name 2 objects 2 2 2   Language- repeat 1 1 1   Language- follow 3 step command 2 3 3   Language- read & follow direction 1 1 1   Write a sentence 1 1 1   Copy design 1 1 1   Total score 29 28 29         Immunization History  Administered Date(s) Administered  . Influenza, High Dose Seasonal PF 08/14/2017  . Influenza,inj,Quad PF,6+ Mos 07/08/2013, 08/04/2014, 08/31/2015, 05/30/2016  . Pneumococcal Conjugate-13 01/05/2015  . Pneumococcal Polysaccharide-23 10/03/2008, 09/08/2016  . Td 10/03/2008  . Zoster 10/03/2008    Qualifies for Shingles Vaccine? Yes, educated and prescription sent to pharmacy  Screening Tests Health Maintenance  Topic Date Due  . TETANUS/TDAP  10/03/2018  . COLONOSCOPY  11/23/2018  . INFLUENZA VACCINE  Completed  . DEXA SCAN  Completed  . Hepatitis C Screening  Completed  . PNA vac Low Risk Adult  Completed    Cancer Screenings: Lung: Low Dose CT Chest recommended if Age 48-80 years, 30 pack-year currently smoking OR have quit w/in 15years. Patient does not qualify. Breast:  Up to date on Mammogram? No Up to date of Bone Density/Dexa? Yes Colorectal: up to date  Additional Screenings:    Hepatitis B/HIV/Syphillis: Declined Hepatitis C Screening: Declined     Plan:    I have personally reviewed and addressed the Medicare Annual Wellness questionnaire and have noted the following in the patient's chart:  A. Medical and social history B. Use of alcohol, tobacco or illicit drugs  C. Current medications and supplements D. Functional ability and status E.  Nutritional status F.  Physical activity G. Advance directives H. List of other physicians I.  Hospitalizations, surgeries, and ER visits in previous 12 months J.  [place] to include hearing, vision, cognitive, depression L. Referrals and appointments - none  In addition, I have reviewed and discussed with patient certain preventive protocols, quality metrics, and best practice recommendations. A written personalized care plan for preventive services as well as general preventive health recommendations were provided to patient.  See attached scanned questionnaire for additional information.   Signed,   Tyson Dense, RN Nurse Health Advisor   Quick Notes   Health Maintenance: Shingirx prescription sent to pharmacy, Mammogram declined     Abnormal Screen: MMSE 29/30. Passed clock drawing     Patient Concerns: none     Nurse Concerns: none

## 2017-10-13 ENCOUNTER — Other Ambulatory Visit: Payer: Self-pay | Admitting: Internal Medicine

## 2017-10-13 DIAGNOSIS — F5104 Psychophysiologic insomnia: Secondary | ICD-10-CM

## 2017-10-17 ENCOUNTER — Other Ambulatory Visit: Payer: Self-pay | Admitting: Internal Medicine

## 2017-10-17 DIAGNOSIS — F5104 Psychophysiologic insomnia: Secondary | ICD-10-CM

## 2017-10-17 NOTE — Telephone Encounter (Signed)
A refill request was received from pharmacy for zolpidem 5 mg tablets. Rx was called in to pharmacy after verifying last fill date, provider, and quantity on PMP [place].

## 2017-10-30 ENCOUNTER — Other Ambulatory Visit: Payer: Self-pay | Admitting: Internal Medicine

## 2017-11-09 ENCOUNTER — Other Ambulatory Visit: Payer: Self-pay | Admitting: Internal Medicine

## 2017-11-10 ENCOUNTER — Other Ambulatory Visit: Payer: Self-pay | Admitting: Internal Medicine

## 2017-11-15 ENCOUNTER — Other Ambulatory Visit: Payer: Self-pay | Admitting: Internal Medicine

## 2017-11-15 DIAGNOSIS — F5104 Psychophysiologic insomnia: Secondary | ICD-10-CM

## 2017-11-16 ENCOUNTER — Other Ambulatory Visit: Payer: Self-pay | Admitting: Internal Medicine

## 2017-11-16 DIAGNOSIS — F5104 Psychophysiologic insomnia: Secondary | ICD-10-CM

## 2017-11-16 NOTE — Telephone Encounter (Signed)
A medication refill was received from pharmacy for zolpidem 5 mg. Rx was called in to pharmacy after verifying last fill date, provider, and quantity on PMP [place] database.   

## 2017-11-20 ENCOUNTER — Telehealth: Payer: Self-pay

## 2017-11-20 NOTE — Telephone Encounter (Signed)
Patient called complaining of cold/upper respiratory/sinus symptoms  Patient with dry cough and sniffles.   1. Fever? No 2. Chills? No 3. Myalgias? No  4. How long have you had your symptoms? 3 days  5. Are you coughing up mucus and if yes any discoloration? No  6. What have you done for your symptoms thus far? Nothing  7. Have you been around anyone sick? No   I will forward your response to your provider and call you with further instructions

## 2017-11-21 MED ORDER — BENZONATATE 100 MG PO CAPS: ORAL_CAPSULE | ORAL | 0 refills | Status: DC

## 2017-11-21 NOTE — Telephone Encounter (Signed)
Patient notified and agreed. Requested Rx to be sent to pharmacy. Faxed.

## 2017-11-21 NOTE — Telephone Encounter (Signed)
She should drink plenty of water.  If she's having increased congestion, she may take plain mucinex to help with this.  She may also use nasal saline.  If she develops fever or myalgias, she should let us know right away.  If her cough is keeping her up at night, we can prescribe tessalon perles 100mg  po qhs prn cough #30.

## 2017-12-08 ENCOUNTER — Other Ambulatory Visit: Payer: Self-pay | Admitting: Internal Medicine

## 2017-12-11 ENCOUNTER — Other Ambulatory Visit: Payer: Self-pay | Admitting: Internal Medicine

## 2017-12-18 ENCOUNTER — Other Ambulatory Visit: Payer: Medicare Other

## 2017-12-18 DIAGNOSIS — I10 Essential (primary) hypertension: Secondary | ICD-10-CM

## 2017-12-18 DIAGNOSIS — E782 Mixed hyperlipidemia: Secondary | ICD-10-CM

## 2017-12-18 DIAGNOSIS — R7303 Prediabetes: Secondary | ICD-10-CM

## 2017-12-19 ENCOUNTER — Other Ambulatory Visit: Payer: Self-pay | Admitting: Internal Medicine

## 2017-12-19 DIAGNOSIS — F5104 Psychophysiologic insomnia: Secondary | ICD-10-CM

## 2017-12-19 LAB — CBC WITH DIFFERENTIAL/PLATELET
Basophils Absolute: 29 cells/uL (ref 0–200)
Basophils Relative: 0.5 %
Eosinophils Absolute: 41 cells/uL (ref 15–500)
Eosinophils Relative: 0.7 %
HCT: 37 % (ref 35.0–45.0)
Hemoglobin: 12.9 g/dL (ref 11.7–15.5)
Lymphs Abs: 1862 cells/uL (ref 850–3900)
MCH: 31.2 pg (ref 27.0–33.0)
MCHC: 34.9 g/dL (ref 32.0–36.0)
MCV: 89.6 fL (ref 80.0–100.0)
MPV: 9.2 fL (ref 7.5–12.5)
Monocytes Relative: 9.6 %
Neutro Abs: 3312 cells/uL (ref 1500–7800)
Neutrophils Relative %: 57.1 %
Platelets: 262 10*3/uL (ref 140–400)
RBC: 4.13 10*6/uL (ref 3.80–5.10)
RDW: 12.6 % (ref 11.0–15.0)
Total Lymphocyte: 32.1 %
WBC mixed population: 557 cells/uL (ref 200–950)
WBC: 5.8 10*3/uL (ref 3.8–10.8)

## 2017-12-19 LAB — LIPID PANEL
Cholesterol: 154 mg/dL (ref <200)
HDL: 47 mg/dL — ABNORMAL LOW (ref >50)
LDL Cholesterol (Calc): 84 mg/dL (calc)
Non-HDL Cholesterol (Calc): 107 mg/dL (calc) (ref <130)
Total CHOL/HDL Ratio: 3.3 (calc) (ref <5.0)
Triglycerides: 129 mg/dL (ref <150)

## 2017-12-19 LAB — COMPLETE METABOLIC PANEL WITH GFR
AG Ratio: 1.1 (calc) (ref 1.0–2.5)
ALT: 15 U/L (ref 6–29)
AST: 14 U/L (ref 10–35)
Albumin: 3.9 g/dL (ref 3.6–5.1)
Alkaline phosphatase (APISO): 57 U/L (ref 33–130)
BUN/Creatinine Ratio: 14 (calc) (ref 6–22)
BUN: 14 mg/dL (ref 7–25)
CO2: 27 mmol/L (ref 20–32)
Calcium: 9.4 mg/dL (ref 8.6–10.4)
Chloride: 107 mmol/L (ref 98–110)
Creat: 1.02 mg/dL — ABNORMAL HIGH (ref 0.60–0.93)
GFR, Est African American: 63 mL/min/{1.73_m2} (ref >=60)
GFR, Est Non African American: 54 mL/min/{1.73_m2} — ABNORMAL LOW (ref >=60)
Globulin: 3.5 g/dL (calc) (ref 1.9–3.7)
Glucose, Bld: 105 mg/dL — ABNORMAL HIGH (ref 65–99)
Potassium: 4.3 mmol/L (ref 3.5–5.3)
Sodium: 140 mmol/L (ref 135–146)
Total Bilirubin: 0.4 mg/dL (ref 0.2–1.2)
Total Protein: 7.4 g/dL (ref 6.1–8.1)

## 2017-12-19 LAB — HEMOGLOBIN A1C
Hgb A1c MFr Bld: 6 % of total Hgb — ABNORMAL HIGH (ref <5.7)
Mean Plasma Glucose: 126 (calc)
eAG (mmol/L): 7 (calc)

## 2017-12-19 NOTE — Telephone Encounter (Signed)
A medication refill was received from pharmacy for zolpidem 5 mg. Rx was called in to pharmacy after verifying last fill date, provider, and quantity on PMP [place] database.   

## 2017-12-20 ENCOUNTER — Other Ambulatory Visit: Payer: Self-pay | Admitting: Internal Medicine

## 2017-12-20 DIAGNOSIS — F5104 Psychophysiologic insomnia: Secondary | ICD-10-CM

## 2017-12-21 ENCOUNTER — Ambulatory Visit (INDEPENDENT_AMBULATORY_CARE_PROVIDER_SITE_OTHER): Payer: Medicare Other | Admitting: Internal Medicine

## 2017-12-21 ENCOUNTER — Encounter: Payer: Self-pay | Admitting: Internal Medicine

## 2017-12-21 VITALS — BP 124/80 | HR 78 | Temp 98.0°F | Ht <= 58 in | Wt 135.6 lb

## 2017-12-21 DIAGNOSIS — R7303 Prediabetes: Secondary | ICD-10-CM

## 2017-12-21 DIAGNOSIS — F329 Major depressive disorder, single episode, unspecified: Secondary | ICD-10-CM | POA: Diagnosis not present

## 2017-12-21 DIAGNOSIS — K056 Periodontal disease, unspecified: Secondary | ICD-10-CM

## 2017-12-21 DIAGNOSIS — M5136 Other intervertebral disc degeneration, lumbar region: Secondary | ICD-10-CM

## 2017-12-21 DIAGNOSIS — M25511 Pain in right shoulder: Secondary | ICD-10-CM | POA: Diagnosis not present

## 2017-12-21 DIAGNOSIS — G8929 Other chronic pain: Secondary | ICD-10-CM

## 2017-12-21 DIAGNOSIS — I10 Essential (primary) hypertension: Secondary | ICD-10-CM | POA: Diagnosis not present

## 2017-12-21 DIAGNOSIS — J301 Allergic rhinitis due to pollen: Secondary | ICD-10-CM

## 2017-12-21 DIAGNOSIS — E039 Hypothyroidism, unspecified: Secondary | ICD-10-CM

## 2017-12-21 DIAGNOSIS — N183 Chronic kidney disease, stage 3 unspecified: Secondary | ICD-10-CM

## 2017-12-21 DIAGNOSIS — F32A Depression, unspecified: Secondary | ICD-10-CM

## 2017-12-21 DIAGNOSIS — E782 Mixed hyperlipidemia: Secondary | ICD-10-CM

## 2017-12-21 DIAGNOSIS — F419 Anxiety disorder, unspecified: Secondary | ICD-10-CM | POA: Diagnosis not present

## 2017-12-21 DIAGNOSIS — F5104 Psychophysiologic insomnia: Secondary | ICD-10-CM | POA: Diagnosis not present

## 2017-12-21 MED ORDER — FEXOFENADINE HCL 180 MG PO TABS: 180.0000 mg | ORAL_TABLET | Freq: Every day | ORAL | 1 refills | Status: DC

## 2017-12-21 MED ORDER — TIZANIDINE HCL 2 MG PO CAPS: 2.0000 mg | ORAL_CAPSULE | Freq: Every day | ORAL | 0 refills | Status: DC | PRN

## 2017-12-21 MED ORDER — DICLOFENAC SODIUM 1 % TD GEL: 2.0000 g | Freq: Four times a day (QID) | TRANSDERMAL | 5 refills | Status: AC

## 2017-12-21 NOTE — Patient Instructions (Addendum)
Ok to use voltaren gel You should only use zanaflex for flare ups of your back and do not mix it with your anxiety medicine and sleeping pill Ok to use allegra for your seasonal allergies and congestion--it should work well Get back to the gym for regular exercise because your sugar has trended up and your kidney function is worse as a result. We'll refer you to [facility] and [facility] will call you--she may be able to help you with getting your denture problems solved.

## 2017-12-21 NOTE — Progress Notes (Signed)
Location:  [facility] clinic Provider:  Tiffany L. Mariea Clonts, D.O., C.M.D.  Code Status: DNR  Goals of Care:  Advanced Directives 10/11/2017  Does Patient Have a Medical Advance Directive? Yes  Type of Paramedic of [place];Living will  Does patient want to make changes to medical advance directive? No - Patient declined  Copy of [place] in Chart? No - copy requested  Would patient like information on creating a medical advance directive? No - Patient declined     Chief Complaint  Patient presents with  . Medical Management of Chronic Issues    4 month F/U; Pt c/o of cough;Pt wants to refill Voltaren gel and Zanaflex 2mg  prescribed by another physician  . Medication Refill    Ambien    HPI: Patient is a 72 y.o. female seen today for medical management of chronic diseases. Feels she has allergies causing issues. She has used Human resources officer D in the past and it works. She notes having a cough, which is new for her with allergies. She does not feel the cough is productive. The tessalon perles did not help with cough. She has not tried anything over the counter. She has stated codeine medication has helped in the past. She denies feverish or chilly. She has nasal drainage-clear. She denies ear pain, sinus pressure or pain. She reports mild headache at times. She reports sleep disturbance with the cough. She denies having trouble breathing in association with this.   She would like [place] to take over prescribing of Zanaflex (2016) for muscle spasms and Voltaren gel (2013) for her shoulder s/p rotator cuff repair. She was prescribed these by Dr. Rhona Raider, Ortho. She does not want to return back to the clinic as she is okay and only needs these occasionally.   DM: A1c is elevated 6.0% up from 5.7%. She has lost weight but reports she has not gone to the gym recently. The last 6 months she was having some personal stress and this got her off her schedule.   Past Medical  History:  Diagnosis Date  . Anxiety   . Cataract    BILATERAL REMOVAL  . Chronic kidney disease    KIDNEY STONES 2 TIMES IN 20 YEARS   . Colon polyp    Tubular Adenoma  . Colon polyp, hyperplastic   . DDD (degenerative disc disease), lumbar   . Diverticulosis   . Flushing   . Hyperlipidemia   . Hypertension   . Hypertonicity of bladder   . Insomnia, unspecified   . Internal hemorrhoid   . Other abnormal blood chemistry   . Sigmoid polyp   . Thyroid disease     Past Surgical History:  Procedure Laterality Date  . [place]   has bilateral silicone implants  . CHOLECYSTECTOMY    . COLONOSCOPY  11/24/2015   Dr. Hilarie Fredrickson  . [place]  . POLYPECTOMY    . ROTATOR CUFF REPAIR  2013    Allergies  Allergen Reactions  . Latex Swelling    Outpatient Encounter Medications as of 12/21/2017  Medication Sig  . ALPRAZolam (XANAX) 1 MG tablet TAKE 2 TABLETS BY MOUTH AT BEDTIME AS NEEDED FOR ANXIETY  . aspirin EC 81 MG tablet Take 1 tablet (81 mg total) by mouth daily.  . benzonatate (TESSALON PERLES) 100 MG capsule Take one capsule by mouth once daily at bedtime as needed for cough  . buPROPion (WELLBUTRIN XL) 300 MG 24 hr  tablet Take 1 tablet (300 mg total) daily by mouth.  . estradiol (ESTRACE) 0.5 MG tablet Take 1 tablet (0.5 mg total) by mouth daily.  Marland Kitchen levothyroxine (SYNTHROID, LEVOTHROID) 88 MCG tablet TAKE ONE TABLET BY MOUTH ONCE DAILY  . losartan (COZAAR) 25 MG tablet TAKE 1 TABLET BY MOUTH ONCE DAILY  . medroxyPROGESTERone (PROVERA) 2.5 MG tablet Take 2.5 mg by mouth daily.  . polyethylene glycol powder (GLYCOLAX/MIRALAX) powder TAKE ONE CAPFUL BY MOUTH TWICE DAILY AS NEEDED  . simvastatin (ZOCOR) 40 MG tablet TAKE 1 TABLET BY MOUTH ONCE DAILY FOR CHOLESTEROL  . zolpidem (AMBIEN) 5 MG tablet TAKE 1 TABLET BY MOUTH AT BEDTIME   No facility-administered encounter medications on file as of 12/21/2017.     Review of Systems:    Review of Systems  Constitutional: Negative for chills, fever and malaise/fatigue.  HENT: Positive for ear pain and sinus pain.   Eyes:       Glasses  Respiratory: Positive for cough and shortness of breath.   Cardiovascular: Negative for chest pain, palpitations and leg swelling.  Gastrointestinal: Negative for blood in stool and heartburn.  Genitourinary: Negative for hematuria.  Neurological: Negative for dizziness and headaches.  Psychiatric/Behavioral: Positive for depression. Negative for memory loss. The patient is nervous/anxious. The patient does not have insomnia.     Health Maintenance  Topic Date Due  . TETANUS/TDAP  10/03/2018  . COLONOSCOPY  11/23/2018  . INFLUENZA VACCINE  Completed  . DEXA SCAN  Completed  . Hepatitis C Screening  Completed  . PNA vac Low Risk Adult  Completed    Physical Exam: Vitals:   12/21/17 1306  BP: 124/80  Pulse: 78  Temp: 98 F (36.7 C)  TempSrc: Oral  SpO2: 96%  Weight: 135 lb 9.6 oz (61.5 kg)  Height: 4\' 2"  (1.27 m)   Body mass index is 38.13 kg/m. Physical Exam  Constitutional: She is oriented to person, place, and time. She appears well-developed and well-nourished.  Cardiovascular: Normal rate, regular rhythm, normal heart sounds and intact distal pulses.  Pulmonary/Chest: Effort normal and breath sounds normal.  Musculoskeletal: Normal range of motion.  Neurological: She is alert and oriented to person, place, and time.  Skin: Skin is warm and dry. Capillary refill takes less than 2 seconds.  Psychiatric: Her speech is normal. Judgment and thought content normal. She is slowed.  Flat mood, but not withdrawn in conversation  Vitals reviewed.   Labs reviewed: Basic Metabolic Panel: Recent Labs    05/09/17 1340 08/09/17 1403 12/18/17 1407  NA 139 135 140  K 4.5 4.1 4.3  CL 104 103 107  CO2 25 24 27   GLUCOSE 115* 103* 105*  BUN 16 13 14   CREATININE 0.84 0.83 1.02*  CALCIUM 9.4 8.8 9.4  TSH  --  4.02  --     Liver Function Tests: Recent Labs    12/18/17 1407  AST 14  ALT 15  BILITOT 0.4  PROT 7.4   No results for input(s): LIPASE, AMYLASE in the last 8760 hours. No results for input(s): AMMONIA in the last 8760 hours. CBC: Recent Labs    05/09/17 1340 12/18/17 1407  WBC 6.0 5.8  NEUTROABS 3,540 3,312  HGB 13.2 12.9  HCT 39.6 37.0  MCV 94.3 89.6  PLT 272 262   Lipid Panel: Recent Labs    05/09/17 1340 12/18/17 1407  CHOL 177 154  HDL 62 47*  LDLCALC 94 84  TRIG 103 129  CHOLHDL 2.9 3.3  Lab Results  Component Value Date   HGBA1C 6.0 (H) 12/18/2017    Procedures since last visit: No results found.  Assessment/Plan  1. Anxiety and depression She is stable, flat affect today during visit. Have tried to wean from Xanax without success. Will maintain current regimen.   2. Psychophysiological insomnia She is on Ambien and needs this to help her sleep. She didn't tolerate a weaning of this. Will continue low dose.  3. Essential hypertension, benign BP is stable today will continue current regimen and assess with labs in future.  - Basic metabolic panel; Future  4. Prediabetes Encouraged her to work on diet and getting back to gym as she can. Will reassess A1c and lipids at future date and start meds if needed.  Will try lifestyle changes first.  - Hemoglobin A1c; Future - Lipid panel; Future  5. Kidney disease, chronic, stage III (GFR 30-59 ml/min) (HCC) Her recent Creatinine is 1.02 is up slightly and her GFR is lower at 54.  Will need to watch this and assess at future visit. Will avoid nephrotoxic drugs And will adjust meds as needed.  - CBC with Differential/Platelet; Future - Basic metabolic panel; Future  6. Seasonal allergic rhinitis due to pollen She wants to start Allegra for this as it as worked in the past. Will order this for her.  - fexofenadine (ALLEGRA ALLERGY) 180 MG tablet; Take 1 tablet (180 mg total) by mouth daily.  Dispense: 30  tablet; Refill: 1  7. Chronic right shoulder pain She has had a RC repair that still gives her trouble. Will order her Voltaren gel  PRN use for pain.  - diclofenac sodium (VOLTAREN) 1 % GEL; Apply 2 g topically 4 (four) times daily. To right shoulder for osteoarthritis  Dispense: 100 g; Refill: 5  8. DDD (degenerative disc disease), lumbar She continues to have lumbar pain and muscle spasms and she is using  Zanaflex as needed.   - tizanidine (ZANAFLEX) 2 MG capsule; Take 1 capsule (2 mg total) by mouth daily as needed for muscle spasms.  Dispense: 15 capsule; Refill: 0  9. Mixed hyperlipidemia Lipids are good at goal. Continue regimen and reassess at future labs. Encouraged to walk and get back to the gym.  - Lipid panel; Future  10. Hypothyroidism, unspecified type Continue current synthroid. She is without signs or symptoms of  Hypothyroidism. Will assess at future lab.  - TSH; Future  11. Sore gums She thinks her dentures are not feeling well and this is making her gums sore.  Will refer her to [facility] for evaluation of this.   - Ambulatory referral to [facility]  Labs/tests ordered:   Orders Placed This Encounter  Procedures  . CBC with Differential/Platelet    Standing Status:   Future    Standing Expiration Date:   08/23/2018  . Basic metabolic panel    Standing Status:   Future    Standing Expiration Date:   08/23/2018    Order Specific Question:   Has the patient fasted?    Answer:   Yes  . Hemoglobin A1c    Standing Status:   Future    Standing Expiration Date:   08/23/2018  . Lipid panel    Standing Status:   Future    Standing Expiration Date:   08/23/2018    Order Specific Question:   Has the patient fasted?    Answer:   Yes  . TSH    Standing Status:  Future    Standing Expiration Date:   08/23/2018  . Ambulatory referral to [facility]    Referral Priority:   Routine    Referral Type:   Consultation    Referral Reason:   Specialty  Services Required    Number of Visits Requested:   1     Next appt:  05/03/2018    Karen Kays, RN, [place], [facility] [phone], [place] [address] Cell Phone (Mon-Fri 8am-5pm):  [phone] On Call:  [phone] & follow prompts after 5pm & weekends Office Phone:  [phone] Office Fax:  [phone]

## 2017-12-22 ENCOUNTER — Telehealth: Payer: Self-pay | Admitting: *Deleted

## 2017-12-22 MED ORDER — TIZANIDINE HCL 2 MG PO TABS: ORAL_TABLET | ORAL | 0 refills | Status: DC

## 2017-12-22 NOTE — Telephone Encounter (Signed)
Rx updated in medication list and faxed to pharmacy.

## 2017-12-22 NOTE — Telephone Encounter (Signed)
Tablets are fine 

## 2017-12-22 NOTE — Telephone Encounter (Signed)
Received fax from [facility] stating that Tizanidine 2mg  capsules are not covered by insurance. Pharmacy wants to know if the capsules could be changed to Tablets instead for insurance to cover. Please Advise.

## 2018-01-08 ENCOUNTER — Other Ambulatory Visit: Payer: Self-pay | Admitting: Internal Medicine

## 2018-01-18 ENCOUNTER — Other Ambulatory Visit: Payer: Self-pay

## 2018-01-18 DIAGNOSIS — F5104 Psychophysiologic insomnia: Secondary | ICD-10-CM

## 2018-01-18 MED ORDER — ZOLPIDEM TARTRATE 5 MG PO TABS: 5.0000 mg | ORAL_TABLET | Freq: Every day | ORAL | 0 refills | Status: DC

## 2018-01-18 NOTE — Telephone Encounter (Signed)
[place] Database verified and compliance confirmed   

## 2018-02-06 ENCOUNTER — Other Ambulatory Visit: Payer: Self-pay | Admitting: Internal Medicine

## 2018-02-13 ENCOUNTER — Telehealth: Payer: Self-pay | Admitting: Internal Medicine

## 2018-02-13 NOTE — Telephone Encounter (Signed)
Left message for patient with information for [place], [place], [place] [address] Phone [phone] as well as Systems developer.  I asked her to call me at [phone]. VDM (DD)

## 2018-02-16 ENCOUNTER — Other Ambulatory Visit: Payer: Self-pay | Admitting: Internal Medicine

## 2018-02-16 DIAGNOSIS — F5104 Psychophysiologic insomnia: Secondary | ICD-10-CM

## 2018-02-23 ENCOUNTER — Telehealth: Payer: Self-pay | Admitting: Internal Medicine

## 2018-02-23 NOTE — Telephone Encounter (Signed)
Left message to follow up with patient on information that I mailed to her regarding dental services.  I asked her to call me at [phone]. VDM (DD)

## 2018-03-07 ENCOUNTER — Other Ambulatory Visit: Payer: Self-pay | Admitting: Internal Medicine

## 2018-03-07 NOTE — Telephone Encounter (Signed)
[place] Database verified and compliance confirmed   

## 2018-03-20 ENCOUNTER — Other Ambulatory Visit: Payer: Self-pay | Admitting: Internal Medicine

## 2018-03-22 ENCOUNTER — Other Ambulatory Visit: Payer: Self-pay | Admitting: Internal Medicine

## 2018-03-22 DIAGNOSIS — F5104 Psychophysiologic insomnia: Secondary | ICD-10-CM

## 2018-04-06 ENCOUNTER — Other Ambulatory Visit: Payer: Self-pay | Admitting: Internal Medicine

## 2018-04-06 NOTE — Telephone Encounter (Signed)
A medication refill was received from pharmacy for alprazolam 1 mg. Rx was called in to pharmacy after verifying last fill date, provider, and quantity on PMP [place].

## 2018-04-20 NOTE — Telephone Encounter (Signed)
-----   Message from Gayland Curry, DO sent at 04/20/2018  3:42 PM EDT ----- Regarding: RE: Medications If she's been off of her hormones already for weeks to months, I recommend she stay off at this point.  They increase her risk of cancer and heart disease when on them long-term.  ----- Message ----- From: Eilene Ghazi, RMA Sent: 04/20/2018   2:06 PM To: Gayland Curry, DO Subject: Medications                                    Patient requesting refills on her Estrace 0.5mg  and Medroxy 2mg  for hot flashes that has returned. She stated that Dr. Deatra Ina does not need her to come back to his office because she doesn't need anymore pap smears, and she would prefer if you would take over these scripts. Pls Advise!

## 2018-04-20 NOTE — Telephone Encounter (Signed)
Spoke with patient regarding her medication refill, she stated that she will keep her appointment with Dr. Deatra Ina to see if she has any other options for the hot flashes. She will continue the conversation with Dr Mariea Clonts when she comes for her appointment on May 03, 2018.

## 2018-04-23 ENCOUNTER — Other Ambulatory Visit: Payer: Self-pay | Admitting: Internal Medicine

## 2018-04-23 DIAGNOSIS — F5104 Psychophysiologic insomnia: Secondary | ICD-10-CM

## 2018-04-26 DIAGNOSIS — Z1289 Encounter for screening for malignant neoplasm of other sites: Secondary | ICD-10-CM | POA: Diagnosis not present

## 2018-04-30 ENCOUNTER — Other Ambulatory Visit: Payer: Medicare Other

## 2018-04-30 DIAGNOSIS — E039 Hypothyroidism, unspecified: Secondary | ICD-10-CM

## 2018-04-30 DIAGNOSIS — R7303 Prediabetes: Secondary | ICD-10-CM | POA: Diagnosis not present

## 2018-04-30 DIAGNOSIS — E782 Mixed hyperlipidemia: Secondary | ICD-10-CM | POA: Diagnosis not present

## 2018-04-30 DIAGNOSIS — N183 Chronic kidney disease, stage 3 unspecified: Secondary | ICD-10-CM

## 2018-04-30 DIAGNOSIS — I10 Essential (primary) hypertension: Secondary | ICD-10-CM

## 2018-05-01 LAB — CBC WITH DIFFERENTIAL/PLATELET
Basophils Absolute: 30 cells/uL (ref 0–200)
Basophils Relative: 0.5 %
Eosinophils Absolute: 18 cells/uL (ref 15–500)
Eosinophils Relative: 0.3 %
HCT: 38.7 % (ref 35.0–45.0)
Hemoglobin: 13.4 g/dL (ref 11.7–15.5)
Lymphs Abs: 2472 cells/uL (ref 850–3900)
MCH: 30.4 pg (ref 27.0–33.0)
MCHC: 34.6 g/dL (ref 32.0–36.0)
MCV: 87.8 fL (ref 80.0–100.0)
MPV: 9 fL (ref 7.5–12.5)
Monocytes Relative: 9.1 %
Neutro Abs: 2844 cells/uL (ref 1500–7800)
Neutrophils Relative %: 48.2 %
Platelets: 280 10*3/uL (ref 140–400)
RBC: 4.41 10*6/uL (ref 3.80–5.10)
RDW: 13.2 % (ref 11.0–15.0)
Total Lymphocyte: 41.9 %
WBC mixed population: 537 cells/uL (ref 200–950)
WBC: 5.9 10*3/uL (ref 3.8–10.8)

## 2018-05-01 LAB — HEMOGLOBIN A1C
Hgb A1c MFr Bld: 6.1 % of total Hgb — ABNORMAL HIGH (ref <5.7)
Mean Plasma Glucose: 128 (calc)
eAG (mmol/L): 7.1 (calc)

## 2018-05-01 LAB — LIPID PANEL
Cholesterol: 193 mg/dL (ref <200)
HDL: 55 mg/dL (ref >50)
LDL Cholesterol (Calc): 113 mg/dL (calc) — ABNORMAL HIGH
Non-HDL Cholesterol (Calc): 138 mg/dL (calc) — ABNORMAL HIGH (ref <130)
Total CHOL/HDL Ratio: 3.5 (calc) (ref <5.0)
Triglycerides: 135 mg/dL (ref <150)

## 2018-05-01 LAB — TSH: TSH: 2.58 mIU/L (ref 0.40–4.50)

## 2018-05-01 LAB — BASIC METABOLIC PANEL
BUN: 16 mg/dL (ref 7–25)
CO2: 23 mmol/L (ref 20–32)
Calcium: 9.2 mg/dL (ref 8.6–10.4)
Chloride: 104 mmol/L (ref 98–110)
Creat: 0.83 mg/dL (ref 0.60–0.93)
Glucose, Bld: 123 mg/dL — ABNORMAL HIGH (ref 65–99)
Potassium: 4.1 mmol/L (ref 3.5–5.3)
Sodium: 137 mmol/L (ref 135–146)

## 2018-05-03 ENCOUNTER — Ambulatory Visit (INDEPENDENT_AMBULATORY_CARE_PROVIDER_SITE_OTHER): Payer: Medicare Other | Admitting: Internal Medicine

## 2018-05-03 ENCOUNTER — Encounter: Payer: Self-pay | Admitting: Internal Medicine

## 2018-05-03 VITALS — BP 126/78 | HR 77 | Temp 98.3°F | Ht <= 58 in | Wt 143.0 lb

## 2018-05-03 DIAGNOSIS — N183 Chronic kidney disease, stage 3 unspecified: Secondary | ICD-10-CM

## 2018-05-03 DIAGNOSIS — L408 Other psoriasis: Secondary | ICD-10-CM

## 2018-05-03 DIAGNOSIS — E039 Hypothyroidism, unspecified: Secondary | ICD-10-CM | POA: Diagnosis not present

## 2018-05-03 DIAGNOSIS — E782 Mixed hyperlipidemia: Secondary | ICD-10-CM

## 2018-05-03 DIAGNOSIS — R7303 Prediabetes: Secondary | ICD-10-CM | POA: Diagnosis not present

## 2018-05-03 DIAGNOSIS — L409 Psoriasis, unspecified: Secondary | ICD-10-CM | POA: Diagnosis not present

## 2018-05-03 MED ORDER — KETOCONAZOLE 2 % EX SHAM: 1.0000 "application " | MEDICATED_SHAMPOO | CUTANEOUS | 0 refills | Status: DC

## 2018-05-03 MED ORDER — KETOCONAZOLE 2 % EX CREA: 1.0000 "application " | TOPICAL_CREAM | Freq: Every day | CUTANEOUS | 0 refills | Status: DC

## 2018-05-03 NOTE — Progress Notes (Signed)
Location:  [facility] clinic Provider:  Julene Rahn L. Mariea Clonts, D.O., C.M.D.  Goals of Care:  Advanced Directives 10/11/2017  Does Patient Have a Medical Advance Directive? Yes  Type of Paramedic of [place];Living will  Does patient want to make changes to medical advance directive? No - Patient declined  Copy of [place] in Chart? No - copy requested  Would patient like information on creating a medical advance directive? No - Patient declined   Chief Complaint  Patient presents with  . Medical Management of Chronic Issues    57mh follow-up    HPI: Patient is a 72y.o. female seen today for medical management of chronic diseases.    Refuses mammograms.  We've talked several times and she does not follow through with getting the test.  LDL has trended up to 113.  Goal less than 70.  DMII:  hba1c 6.1 but has been on an upward trend.  Renal function much improved.  She is strapped financially.  She's been eating mac and cheese, yogurt. In the past two weeks, she discovered frozen veggies in the steamable bags and she's been eating those now.  Says if she controls her carb portions, that should help her, too.  She's not been very active or to the gym.    She's remained depressed.  She worries about her daughter, MBrayton Layman who lost her daughter and cries every day.  She has two cats and plans to get another kitten.  She went [facility] [employer]    Has a rash on her left side of her neck--red, scaly, itchy especially when wet.  Also has more scaling of scalp and forehead, some in nasolabial folds.  Past Medical History:  Diagnosis Date  . Anxiety   . Cataract    BILATERAL REMOVAL  . Chronic kidney disease    KIDNEY STONES 2 TIMES IN 20 YEARS   . Colon polyp    Tubular Adenoma  . Colon polyp, hyperplastic   . DDD (degenerative disc disease), lumbar   . Diverticulosis   . Flushing   . Hyperlipidemia   . Hypertension   . Hypertonicity of bladder   .  Insomnia, unspecified   . Internal hemorrhoid   . Other abnormal blood chemistry   . Sigmoid polyp   . Thyroid disease     Past Surgical History:  Procedure Laterality Date  . [place]  has bilateral silicone implants  . CHOLECYSTECTOMY    . COLONOSCOPY  11/24/2015   [place]    . ROTATOR CUFF REPAIR  2013    Allergies  Allergen Reactions  . Latex Swelling    Outpatient Encounter Medications as of 05/03/2018  Medication Sig  . ALPRAZolam (XANAX) 1 MG tablet TAKE 2 TABLETS BY MOUTH AT BEDTIME AS NEEDED FOR  ANXIETY  . aspirin EC 81 MG tablet Take 1 tablet (81 mg total) by mouth daily.  .Marland KitchenbuPROPion (WELLBUTRIN XL) 300 MG 24 hr tablet Take 1 tablet (300 mg total) daily by mouth.  . diclofenac sodium (VOLTAREN) 1 % GEL Apply 2 g topically 4 (four) times daily. To right shoulder for osteoarthritis  . estradiol (ESTRACE) 0.5 MG tablet Take 1 tablet (0.5 mg total) by mouth daily.  . fexofenadine (ALLEGRA ALLERGY) 180 MG tablet Take 1 tablet (180 mg total) by mouth daily.  .Marland Kitchenlevothyroxine (SYNTHROID, LEVOTHROID) 88 MCG tablet TAKE ONE TABLET BY MOUTH  ONCE DAILY  . losartan (COZAAR) 25 MG tablet TAKE 1 TABLET BY MOUTH ONCE DAILY  . medroxyPROGESTERone (PROVERA) 2.5 MG tablet Take 2.5 mg by mouth daily.  . polyethylene glycol powder (GLYCOLAX/MIRALAX) powder TAKE ONE CAPFUL BY MOUTH TWICE DAILY AS NEEDED  . simvastatin (ZOCOR) 40 MG tablet TAKE 1 TABLET BY MOUTH ONCE DAILY FOR CHOLESTEROL  . tiZANidine (ZANAFLEX) 2 MG tablet Take one tablet by mouth daily as needed for muscle spasms.  Marland Kitchen zolpidem (AMBIEN) 5 MG tablet TAKE 1 TABLET BY MOUTH AT BEDTIME  . [DISCONTINUED] losartan (COZAAR) 25 MG tablet TAKE 1 TABLET BY MOUTH ONCE DAILY   No facility-administered encounter medications on file as of 05/03/2018.     Review of Systems:  Review of Systems  Constitutional: Negative for chills, fever and malaise/fatigue.    HENT: Negative for hearing loss.   Eyes: Negative for blurred vision.  Respiratory: Negative for cough and shortness of breath.   Cardiovascular: Negative for chest pain, palpitations and leg swelling.  Gastrointestinal: Negative for abdominal pain, blood in stool, constipation, diarrhea, heartburn and melena.  Genitourinary: Negative for dysuria.  Musculoskeletal: Negative for falls and joint pain.  Skin: Positive for itching and rash.  Neurological: Negative for dizziness and loss of consciousness.  Endo/Heme/Allergies: Does not bruise/bleed easily.  Psychiatric/Behavioral: Positive for depression. Negative for memory loss. The patient has insomnia. The patient is not nervous/anxious.     Health Maintenance  Topic Date Due  . INFLUENZA VACCINE  05/03/2018  . TETANUS/TDAP  10/03/2018  . COLONOSCOPY  11/23/2018  . DEXA SCAN  Completed  . Hepatitis C Screening  Completed  . PNA vac Low Risk Adult  Completed    Physical Exam: Vitals:   05/03/18 1305  BP: 126/78  Pulse: 77  Temp: 98.3 F (36.8 C)  TempSrc: Oral  SpO2: 98%  Weight: 143 lb (64.9 kg)  Height: _0  (1.448 m)   Body mass index is 30.94 kg/m. Physical Exam  Constitutional: She is oriented to person, place, and time. She appears well-developed and well-nourished. No distress.  HENT:  Head: Normocephalic and atraumatic.  Cardiovascular: Normal rate, regular rhythm, normal heart sounds and intact distal pulses.  Pulmonary/Chest: Effort normal and breath sounds normal. No respiratory distress.  Abdominal: Bowel sounds are normal.  Musculoskeletal: Normal range of motion. She exhibits no tenderness.  Neurological: She is alert and oriented to person, place, and time.  Skin: Skin is warm and dry. Capillary refill takes less than 2 seconds.  Left side of neck of ringlike raised areas with scale, scaly skin in two patches on sides of forehead, also down onto left shoulder, small amount right neck  Psychiatric: She  has a normal mood and affect.   Labs reviewed: Basic Metabolic Panel: Recent Labs    08/09/17 1403 12/18/17 1407 04/30/18 1352  NA 135 140 137  K 4.1 4.3 4.1  CL 103 107 104  CO2 _1 GLUCOSE 103* 105* 123*  BUN _2 CREATININE 0.83 1.02* 0.83  CALCIUM 8.8 9.4 9.2  TSH 4.02  --  2.58   Liver Function Tests: Recent Labs    12/18/17 1407  AST 14  ALT 15  BILITOT 0.4  PROT 7.4   No results for input(s): LIPASE, AMYLASE in the last 8760 hours. No results for input(s): AMMONIA in the last 8760 hours. CBC: Recent Labs    05/09/17 1340 12/18/17 1407 04/30/18 1352  WBC 6.0 5.8 5.9  NEUTROABS 3,540 3,312  2,844  HGB 13.2 12.9 13.4  HCT 39.6 37.0 38.7  MCV 94.3 89.6 87.8  PLT 272 262 280   Lipid Panel: Recent Labs    05/09/17 1340 12/18/17 1407 04/30/18 1352  CHOL 177 154 193  HDL 62 47* 55  LDLCALC 94 84 113*  TRIG 103 129 135  CHOLHDL 2.9 3.3 3.5   Lab Results  Component Value Date   HGBA1C 6.1 (H) 04/30/2018    Assessment/Plan 1. Psoriasis of scalp - will prescribe nizoral--suspect this is psoriasis; if not responsive, may be more of a seborrheic dermatitis - ketoconazole (NIZORAL) 2 % cream; Apply 1 application topically daily.  Dispense: 15 g; Refill: 0 - ketoconazole (NIZORAL) 2 % shampoo; Apply 1 application topically 2 (two) times a week.  Dispense: 120 mL; Refill: 0  2. Annular psoriasis - as in #1 - ketoconazole (NIZORAL) 2 % cream; Apply 1 application topically daily.  Dispense: 15 g; Refill: 0 - ketoconazole (NIZORAL) 2 % shampoo; Apply 1 application topically 2 (two) times a week.  Dispense: 120 mL; Refill: 0  3. Prediabetes - get back to exercise, cut down on simple carbs and refined carbs in diet - CBC with Differential/Platelet; Future - COMPLETE METABOLIC PANEL WITH GFR; Future - Hemoglobin A1c; Future  4. Kidney disease, chronic, stage III (GFR 30-59 ml/min) (HCC) - Avoid nephrotoxic agents like nsaids, dose adjust renally  excreted meds, hydrate. - COMPLETE METABOLIC PANEL WITH GFR; Future  5. Mixed hyperlipidemia - cont zocor therapy and improve diet - COMPLETE METABOLIC PANEL WITH GFR; Future - Lipid panel; Future  6. Hypothyroidism, unspecified type - cont current levothyroxine, last tsh wnl - TSH; Future  Labs/tests ordered:  Orders Placed This Encounter  Procedures  . CBC with Differential/Platelet    Standing Status:   Future    Standing Expiration Date:   01/02/2019  . COMPLETE METABOLIC PANEL WITH GFR    Standing Status:   Future    Standing Expiration Date:   01/02/2019  . Hemoglobin A1c    Standing Status:   Future    Standing Expiration Date:   01/02/2019  . TSH    Standing Status:   Future    Standing Expiration Date:   01/02/2019  . Lipid panel    Standing Status:   Future    Standing Expiration Date:   01/02/2019    Next appt:  10/12/2018 med mgt, labs before  Christa Fasig L. Ladaisha Portillo, D.O. [facility] [address], [place] [address] Cell Phone (Mon-Fri 8am-5pm):  [phone] On Call:  [phone] & follow prompts after 5pm & weekends Office Phone:  [phone] Office Fax:  [phone]

## 2018-05-07 ENCOUNTER — Other Ambulatory Visit: Payer: Self-pay | Admitting: Internal Medicine

## 2018-05-07 NOTE — Telephone Encounter (Signed)
A medication refill was received from pharmacy for alprazolam 1 mg. Rx was called in to pharmacy after verifying last fill date, provider, and quantity on PMP [place].

## 2018-05-09 DIAGNOSIS — H04123 Dry eye syndrome of bilateral lacrimal glands: Secondary | ICD-10-CM | POA: Diagnosis not present

## 2018-05-09 DIAGNOSIS — H353111 Nonexudative age-related macular degeneration, right eye, early dry stage: Secondary | ICD-10-CM | POA: Diagnosis not present

## 2018-05-09 DIAGNOSIS — H35033 Hypertensive retinopathy, bilateral: Secondary | ICD-10-CM | POA: Diagnosis not present

## 2018-05-09 DIAGNOSIS — H11823 Conjunctivochalasis, bilateral: Secondary | ICD-10-CM | POA: Diagnosis not present

## 2018-05-09 DIAGNOSIS — H0100B Unspecified blepharitis left eye, upper and lower eyelids: Secondary | ICD-10-CM | POA: Diagnosis not present

## 2018-05-09 DIAGNOSIS — H0100A Unspecified blepharitis right eye, upper and lower eyelids: Secondary | ICD-10-CM | POA: Diagnosis not present

## 2018-05-09 DIAGNOSIS — Z961 Presence of intraocular lens: Secondary | ICD-10-CM | POA: Diagnosis not present

## 2018-05-09 DIAGNOSIS — E113293 Type 2 diabetes mellitus with mild nonproliferative diabetic retinopathy without macular edema, bilateral: Secondary | ICD-10-CM | POA: Diagnosis not present

## 2018-05-09 DIAGNOSIS — H18413 Arcus senilis, bilateral: Secondary | ICD-10-CM | POA: Diagnosis not present

## 2018-05-09 DIAGNOSIS — Z9849 Cataract extraction status, unspecified eye: Secondary | ICD-10-CM | POA: Diagnosis not present

## 2018-05-09 DIAGNOSIS — H11153 Pinguecula, bilateral: Secondary | ICD-10-CM | POA: Diagnosis not present

## 2018-05-09 DIAGNOSIS — H43393 Other vitreous opacities, bilateral: Secondary | ICD-10-CM | POA: Diagnosis not present

## 2018-05-09 LAB — HM DIABETES EYE EXAM

## 2018-05-15 ENCOUNTER — Other Ambulatory Visit: Payer: Self-pay | Admitting: Internal Medicine

## 2018-05-29 ENCOUNTER — Other Ambulatory Visit: Payer: Self-pay | Admitting: Internal Medicine

## 2018-05-29 DIAGNOSIS — L408 Other psoriasis: Secondary | ICD-10-CM

## 2018-05-29 DIAGNOSIS — F5104 Psychophysiologic insomnia: Secondary | ICD-10-CM

## 2018-05-29 DIAGNOSIS — L409 Psoriasis, unspecified: Secondary | ICD-10-CM

## 2018-05-30 ENCOUNTER — Other Ambulatory Visit: Payer: Self-pay | Admitting: Internal Medicine

## 2018-05-30 DIAGNOSIS — F5104 Psychophysiologic insomnia: Secondary | ICD-10-CM

## 2018-06-07 ENCOUNTER — Other Ambulatory Visit: Payer: Self-pay | Admitting: Internal Medicine

## 2018-07-02 ENCOUNTER — Telehealth: Payer: Self-pay | Admitting: *Deleted

## 2018-07-02 DIAGNOSIS — L409 Psoriasis, unspecified: Secondary | ICD-10-CM

## 2018-07-02 DIAGNOSIS — L308 Other specified dermatitis: Secondary | ICD-10-CM

## 2018-07-02 NOTE — Telephone Encounter (Signed)
I suggest she see dermatology for further evaluation.  Please place referral for psoriasis and eczema if she agrees.

## 2018-07-02 NOTE — Telephone Encounter (Signed)
Patient called and stated that the cream she was given for Psoriasis works well but now she has developed eczema. Stated that she has tried the OTC medications for it with no relief. Patient is requesting a Rx. Please Advise.

## 2018-07-03 NOTE — Telephone Encounter (Signed)
LMOM to return call.

## 2018-07-03 NOTE — Telephone Encounter (Signed)
Discussed Dr.Reed's response with patient, patient will try OTC creams and possibly consider referral in the future.

## 2018-07-06 ENCOUNTER — Other Ambulatory Visit: Payer: Self-pay | Admitting: Internal Medicine

## 2018-07-09 NOTE — Telephone Encounter (Signed)
Patient called and wanted a referral to Dermatologist.  Referral placed.

## 2018-07-09 NOTE — Addendum Note (Signed)
Addended by: Rafael Bihari A on: 07/09/2018 11:30 AM   Modules accepted: Orders

## 2018-07-16 ENCOUNTER — Ambulatory Visit (INDEPENDENT_AMBULATORY_CARE_PROVIDER_SITE_OTHER): Payer: Medicare Other | Admitting: *Deleted

## 2018-07-16 ENCOUNTER — Other Ambulatory Visit: Payer: Self-pay | Admitting: Internal Medicine

## 2018-07-16 DIAGNOSIS — F5104 Psychophysiologic insomnia: Secondary | ICD-10-CM

## 2018-07-16 DIAGNOSIS — Z23 Encounter for immunization: Secondary | ICD-10-CM | POA: Diagnosis not present

## 2018-07-20 ENCOUNTER — Other Ambulatory Visit: Payer: Self-pay | Admitting: Internal Medicine

## 2018-07-20 DIAGNOSIS — L408 Other psoriasis: Secondary | ICD-10-CM

## 2018-07-20 DIAGNOSIS — L409 Psoriasis, unspecified: Secondary | ICD-10-CM

## 2018-08-18 ENCOUNTER — Other Ambulatory Visit: Payer: Self-pay | Admitting: Internal Medicine

## 2018-08-18 DIAGNOSIS — F5104 Psychophysiologic insomnia: Secondary | ICD-10-CM

## 2018-08-20 ENCOUNTER — Other Ambulatory Visit: Payer: Self-pay | Admitting: Internal Medicine

## 2018-08-20 DIAGNOSIS — F5104 Psychophysiologic insomnia: Secondary | ICD-10-CM

## 2018-08-20 NOTE — Telephone Encounter (Signed)
A medication refill was received from pharmacy for zolpidem 5 mg and alprazolam 1 mg.  Rx was called in to pharmacy after verifying last fill date, provider, and quantity on PMP [place].

## 2018-10-05 ENCOUNTER — Other Ambulatory Visit: Payer: Self-pay

## 2018-10-05 DIAGNOSIS — F5104 Psychophysiologic insomnia: Secondary | ICD-10-CM

## 2018-10-05 MED ORDER — ALPRAZOLAM 1 MG PO TABS: ORAL_TABLET | ORAL | 0 refills | Status: DC

## 2018-10-05 MED ORDER — ZOLPIDEM TARTRATE 5 MG PO TABS: 5.0000 mg | ORAL_TABLET | Freq: Every day | ORAL | 0 refills | Status: DC

## 2018-10-05 NOTE — Telephone Encounter (Signed)
Last OV 05/03/2018 Last refill 08/20/2018 Database verified Appointment scheduled for 10/15/2018

## 2018-10-10 DIAGNOSIS — L304 Erythema intertrigo: Secondary | ICD-10-CM | POA: Diagnosis not present

## 2018-10-10 DIAGNOSIS — L821 Other seborrheic keratosis: Secondary | ICD-10-CM | POA: Diagnosis not present

## 2018-10-10 DIAGNOSIS — L218 Other seborrheic dermatitis: Secondary | ICD-10-CM | POA: Diagnosis not present

## 2018-10-10 DIAGNOSIS — L308 Other specified dermatitis: Secondary | ICD-10-CM | POA: Diagnosis not present

## 2018-10-11 ENCOUNTER — Ambulatory Visit: Payer: Self-pay | Admitting: Internal Medicine

## 2018-10-12 ENCOUNTER — Ambulatory Visit: Payer: Self-pay

## 2018-10-12 ENCOUNTER — Other Ambulatory Visit: Payer: Self-pay | Admitting: *Deleted

## 2018-10-12 DIAGNOSIS — F32A Depression, unspecified: Secondary | ICD-10-CM

## 2018-10-12 DIAGNOSIS — F419 Anxiety disorder, unspecified: Principal | ICD-10-CM

## 2018-10-12 DIAGNOSIS — F329 Major depressive disorder, single episode, unspecified: Secondary | ICD-10-CM

## 2018-10-12 MED ORDER — BUPROPION HCL ER (XL) 300 MG PO TB24: 300.0000 mg | ORAL_TABLET | Freq: Every day | ORAL | 1 refills | Status: DC

## 2018-10-12 NOTE — Telephone Encounter (Signed)
Patient requested refill Faxed to pharmacy.  

## 2018-10-15 ENCOUNTER — Ambulatory Visit (INDEPENDENT_AMBULATORY_CARE_PROVIDER_SITE_OTHER): Payer: Medicare Other | Admitting: Internal Medicine

## 2018-10-15 ENCOUNTER — Encounter: Payer: Self-pay | Admitting: Family

## 2018-10-15 ENCOUNTER — Encounter: Payer: Self-pay | Admitting: Internal Medicine

## 2018-10-15 ENCOUNTER — Ambulatory Visit (INDEPENDENT_AMBULATORY_CARE_PROVIDER_SITE_OTHER): Payer: Medicare Other | Admitting: Family

## 2018-10-15 VITALS — BP 100/80 | HR 82 | Temp 98.4°F | Ht <= 58 in | Wt 147.0 lb

## 2018-10-15 VITALS — BP 100/80 | HR 82 | Temp 98.4°F | Resp 18 | Ht <= 58 in | Wt 147.0 lb

## 2018-10-15 DIAGNOSIS — R7303 Prediabetes: Secondary | ICD-10-CM | POA: Diagnosis not present

## 2018-10-15 DIAGNOSIS — L409 Psoriasis, unspecified: Secondary | ICD-10-CM | POA: Diagnosis not present

## 2018-10-15 DIAGNOSIS — Z23 Encounter for immunization: Secondary | ICD-10-CM

## 2018-10-15 DIAGNOSIS — F329 Major depressive disorder, single episode, unspecified: Secondary | ICD-10-CM

## 2018-10-15 DIAGNOSIS — F439 Reaction to severe stress, unspecified: Secondary | ICD-10-CM | POA: Diagnosis not present

## 2018-10-15 DIAGNOSIS — E782 Mixed hyperlipidemia: Secondary | ICD-10-CM

## 2018-10-15 DIAGNOSIS — N183 Chronic kidney disease, stage 3 unspecified: Secondary | ICD-10-CM

## 2018-10-15 DIAGNOSIS — E039 Hypothyroidism, unspecified: Secondary | ICD-10-CM

## 2018-10-15 DIAGNOSIS — F419 Anxiety disorder, unspecified: Secondary | ICD-10-CM

## 2018-10-15 DIAGNOSIS — B372 Candidiasis of skin and nail: Secondary | ICD-10-CM

## 2018-10-15 DIAGNOSIS — Z0000 Encounter for general adult medical examination without abnormal findings: Secondary | ICD-10-CM

## 2018-10-15 DIAGNOSIS — F32A Depression, unspecified: Secondary | ICD-10-CM

## 2018-10-15 MED ORDER — ZOSTER VAC RECOMB ADJUVANTED 50 MCG/0.5ML IM SUSR: 0.5000 mL | Freq: Once | INTRAMUSCULAR | 1 refills | Status: AC

## 2018-10-15 MED ORDER — TETANUS-DIPHTH-ACELL PERTUSSIS 5-2.5-18.5 LF-MCG/0.5 IM SUSP: 0.5000 mL | Freq: Once | INTRAMUSCULAR | 0 refills | Status: AC

## 2018-10-15 MED ORDER — TETANUS-DIPHTH-ACELL PERTUSSIS 5-2.5-18.5 LF-MCG/0.5 IM SUSP: 0.5000 mL | Freq: Once | INTRAMUSCULAR | 0 refills | Status: DC

## 2018-10-15 NOTE — Patient Instructions (Addendum)
Miconazole cream over the counter may help under your breasts when you got hot in the summer and a rash develops.    Please do get back to the gym.  I think you'll feel better after you are on your wellbutrin regularly again and exercise regularly.  Enjoy your kitties!    Come in for fasting labs!

## 2018-10-15 NOTE — Progress Notes (Signed)
Location:  [facility] clinic Provider:  Hennessey Cantrell L. Mariea Clonts, D.O., C.M.D.  Goals of Care:  Advanced Directives 10/15/2018  Does Patient Have a Medical Advance Directive? Yes  Type of Paramedic of [place];Living will  Does patient want to make changes to medical advance directive? No - Patient declined  Copy of [place] in Chart? No - copy requested  Would patient like information on creating a medical advance directive? -     Chief Complaint  Patient presents with  . Medical Management of Chronic Issues    109mth follow-up    HPI: Patient is a 73 y.o. female seen today for medical management of chronic diseases.    Pt did not come for her labs.  She is not fasting at this time of day.    No idea what her sugar and cholesterol are like at this point.    She is frustrated.  She had not seen derm.  Finally this month, she googled and got an appt Monday.  She was given two prescriptions--one is the same as I gave her and she was to get a compound from [facility].  She combined the two herself with generics due to cost.  She has the shampoo I gave her.  There is another Rx waiting at [facility] from derm, but it was $97.  She also had a place by her right breast.   She had been w/o her wellbutrin since November due to changing pharmacies.  She feels like she spends her day on the phone dealing with bills.    Her daughter is focusing on the keto diet now.  I counseled her about hypoglycemia and potential increased in cholesterol that that diet does.  She was going to the gym until the holidays and then she ate candy again and stopped going.  She has avoided buying any more of that now.    Past Medical History:  Diagnosis Date  . Anxiety   . Cataract    BILATERAL REMOVAL  . Chronic kidney disease    KIDNEY STONES 2 TIMES IN 20 YEARS   . Colon polyp    Tubular Adenoma  . Colon polyp, hyperplastic   . DDD (degenerative disc disease), lumbar   .  Diverticulosis   . Flushing   . Hyperlipidemia   . Hypertension   . Hypertonicity of bladder   . Insomnia, unspecified   . Internal hemorrhoid   . Other abnormal blood chemistry   . Sigmoid polyp   . Thyroid disease     Past Surgical History:  Procedure Laterality Date  . [place]   has bilateral silicone implants  . CHOLECYSTECTOMY    . COLONOSCOPY  11/24/2015   Dr. Hilarie Fredrickson  . [place]  . POLYPECTOMY    . ROTATOR CUFF REPAIR  2013    Allergies  Allergen Reactions  . Latex Swelling    Outpatient Encounter Medications as of 10/15/2018  Medication Sig  . ALPRAZolam (XANAX) 1 MG tablet TAKE 2 TABLETS BY MOUTH ONCE DAILY AT BEDTIME AS NEEDED FOR ANXIETY  . aspirin EC 81 MG tablet Take 1 tablet (81 mg total) by mouth daily.  Marland Kitchen buPROPion (WELLBUTRIN XL) 300 MG 24 hr tablet Take 1 tablet (300 mg total) by mouth daily.  . diclofenac sodium (VOLTAREN) 1 % GEL Apply 2 g topically 4 (four) times daily. To right shoulder for osteoarthritis  . estradiol (ESTRACE) 0.5 MG tablet Take  1 tablet (0.5 mg total) by mouth daily.  . fexofenadine (ALLEGRA ALLERGY) 180 MG tablet Take 1 tablet (180 mg total) by mouth daily.  Marland Kitchen ketoconazole (NIZORAL) 2 % cream  APPLY ONE APPLICATION TOPICALLY DAILY  . ketoconazole (NIZORAL) 2 % shampoo  APPLY ONE APPLICATION TOPICALLY TWO TIMES A WEEK  . levothyroxine (SYNTHROID, LEVOTHROID) 88 MCG tablet TAKE 1 TABLET BY MOUTH ONCE DAILY  . losartan (COZAAR) 25 MG tablet TAKE 1 TABLET BY MOUTH ONCE DAILY  . medroxyPROGESTERone (PROVERA) 2.5 MG tablet Take 2.5 mg by mouth daily.  . polyethylene glycol powder (GLYCOLAX/MIRALAX) powder TAKE ONE CAPFUL BY MOUTH TWICE DAILY AS NEEDED  . simvastatin (ZOCOR) 40 MG tablet TAKE 1 TABLET BY MOUTH ONCE DAILY FOR CHOLESTEROL  . Tdap (BOOSTRIX) 5-2.5-18.5 LF-MCG/0.5 injection Inject 0.5 mLs into the muscle once for 1 dose.  Marland Kitchen tiZANidine (ZANAFLEX) 2 MG tablet Take one tablet by  mouth daily as needed for muscle spasms.  Marland Kitchen zolpidem (AMBIEN) 5 MG tablet Take 1 tablet (5 mg total) by mouth at bedtime.   No facility-administered encounter medications on file as of 10/15/2018.     Review of Systems:  Review of Systems  Constitutional: Positive for malaise/fatigue. Negative for chills and fever.  HENT: Negative for congestion.   Eyes: Negative for blurred vision.  Respiratory: Negative for cough and shortness of breath.   Cardiovascular: Negative for chest pain, palpitations and leg swelling.  Gastrointestinal: Negative for abdominal pain, blood in stool, constipation, heartburn and melena.  Genitourinary: Negative for dysuria.  Musculoskeletal: Negative for falls, joint pain and myalgias.  Skin: Positive for itching and rash.       Much improved after starting on cream compound for her psoriasis  Neurological: Negative for dizziness and loss of consciousness.  Endo/Heme/Allergies: Negative for polydipsia. Does not bruise/bleed easily.  Psychiatric/Behavioral: Positive for depression. Negative for memory loss and suicidal ideas. The patient is nervous/anxious and has insomnia.     Health Maintenance  Topic Date Due  . TETANUS/TDAP  10/03/2018  . COLONOSCOPY  11/23/2018  . INFLUENZA VACCINE  Completed  . DEXA SCAN  Completed  . Hepatitis C Screening  Completed  . PNA vac Low Risk Adult  Completed    Physical Exam: Vitals:   10/15/18 1559  BP: 100/80  Pulse: 82  Temp: 98.4 F (36.9 C)  TempSrc: Oral  SpO2: 95%  Weight: 147 lb (66.7 kg)  Height: 4\' 9"  (1.448 m)   Body mass index is 31.81 kg/m. Physical Exam Vitals signs reviewed.  Constitutional:      General: She is not in acute distress.    Appearance: Normal appearance. She is obese. She is not toxic-appearing.  HENT:     Head: Normocephalic and atraumatic.  Eyes:     Comments: glasses  Cardiovascular:     Rate and Rhythm: Normal rate and regular rhythm.     Pulses: Normal pulses.      Heart sounds: Normal heart sounds.  Pulmonary:     Effort: Pulmonary effort is normal.     Breath sounds: Normal breath sounds.  Abdominal:     General: Bowel sounds are normal.  Musculoskeletal: Normal range of motion.  Skin:    General: Skin is warm and dry.     Capillary Refill: Capillary refill takes less than 2 seconds.     Coloration: Skin is pale.     Comments: Dry scaly scalp, but skin of neck, upper chest and around ears dramatically improved  Neurological:  General: No focal deficit present.     Mental Status: She is alert and oriented to person, place, and time.     Labs reviewed: Basic Metabolic Panel: Recent Labs    12/18/17 1407 04/30/18 1352  NA 140 137  K 4.3 4.1  CL 107 104  CO2 27 23  GLUCOSE 105* 123*  BUN 14 16  CREATININE 1.02* 0.83  CALCIUM 9.4 9.2  TSH  --  2.58   Liver Function Tests: Recent Labs    12/18/17 1407  AST 14  ALT 15  BILITOT 0.4  PROT 7.4   No results for input(s): LIPASE, AMYLASE in the last 8760 hours. No results for input(s): AMMONIA in the last 8760 hours. CBC: Recent Labs    12/18/17 1407 04/30/18 1352  WBC 5.8 5.9  NEUTROABS 3,312 2,844  HGB 12.9 13.4  HCT 37.0 38.7  MCV 89.6 87.8  PLT 262 280   Lipid Panel: Recent Labs    12/18/17 1407 04/30/18 1352  CHOL 154 193  HDL 47* 55  LDLCALC 84 113*  TRIG 129 135  CHOLHDL 3.3 3.5   Lab Results  Component Value Date   HGBA1C 6.1 (H) 04/30/2018    Procedures since last visit: No results found.  Assessment/Plan 1. Candidal skin infection -discussed that yeast rash b/w and beneath breasts would be treated with nystatin  2. Prediabetes -ongoing, get back to gym consistently and improve diet--warned about risks of high cholesterol and hypoglycemia issues with keto diet so she plans to do a "lightened up" version of this with her daughter rather than the exact plan so she does not have these complications  3. Stress at home -related to her  granddaughter's suicide and daughter's subsequent depression and suicidal ideation in recent past -pt does not herself go to counseling due to cost -cont wellbutrin and xanax (attempts to taper xanax did not succeed--has been on for years and years before I knew her)  4. Anxiety and depression - cont treatment as above, ideally should have counselor, but cannot afford this  5. Psoriasis -cont compounded steroid and moisturizer as per derm and f/u with them as recommended  6. Kidney disease, chronic, stage III (GFR 30-59 ml/min) (HCC) -Avoid nephrotoxic agents like nsaids, dose adjust renally excreted meds, hydrate.  7. Mixed hyperlipidemia -cont zocor 40mg  po daily and get back to gym or will need adjustment to regimen to be at goal of LDL <70  8. Hypothyroidism, unspecified type -cont current levothyroxine -f/u lab today  Labs/tests ordered:  Was to have labs before but didn't so will come in coming days for labs:  Cbc, cmp, hba1c, tsh, lipid; then f/u labs as appropriate based on those  Next appt: 02/14/2019 med mgt  Eleina Jergens L. Fidela Cieslak, D.O. [facility] [address], [place] [address] Cell Phone (Mon-Fri 8am-5pm):  [phone] On Call:  [phone] & follow prompts after 5pm & weekends Office Phone:  [phone] Office Fax:  [phone]

## 2018-10-15 NOTE — Patient Instructions (Signed)
[name] , Thank you for taking time to come for your Medicare Wellness Visit. I appreciate your ongoing commitment to your health goals. Please review the following plan we discussed and let me know if I can assist you in the future.   Screening recommendations/referrals: Colonoscopy: up date  Mammogram:  Bone Density; completed  Recommended yearly ophthalmology/optometry visit for glaucoma screening and checkup Recommended yearly dental visit for hygiene and checkup  Vaccinations: Influenza vaccine: Up to date  Pneumococcal vaccine: up date  Tdap vaccine : Ordered today  Shingles vaccine: Ordered today    Advanced directives: Information provided   Conditions/risks identified: Cardiovascular disease due to obesity   Next appointment: 1 year for annual wellness visit    Preventive Care 75 Years and Older, Female Preventive care refers to lifestyle choices and visits with your health care provider that can promote health and wellness. What does preventive care include?  A yearly physical exam. This is also called an annual well check.  Dental exams once or twice a year.  Routine eye exams. Ask your health care provider how often you should have your eyes checked.  Personal lifestyle choices, including:  Daily care of your teeth and gums.  Regular physical activity.  Eating a healthy diet.  Avoiding tobacco and drug use.  Limiting alcohol use.  Practicing safe sex.  Taking low-dose aspirin every day.  Taking vitamin and mineral supplements as recommended by your health care provider. What happens during an annual well check? The services and screenings done by your health care provider during your annual well check will depend on your age, overall health, lifestyle risk factors, and family history of disease. Counseling  Your health care provider may ask you questions about your:  Alcohol use.  Tobacco use.  Drug use.  Emotional well-being.  Home and  relationship well-being.  Sexual activity.  Eating habits.  History of falls.  Memory and ability to understand (cognition).  Work and work Statistician.  Reproductive health. Screening  You may have the following tests or measurements:  Height, weight, and BMI.  Blood pressure.  Lipid and cholesterol levels. These may be checked every 5 years, or more frequently if you are over 57 years old.  Skin check.  Lung cancer screening. You may have this screening every year starting at age 71 if you have a 30-pack-year history of smoking and currently smoke or have quit within the past 15 years.  Fecal occult blood test (FOBT) of the stool. You may have this test every year starting at age 57.  Flexible sigmoidoscopy or colonoscopy. You may have a sigmoidoscopy every 5 years or a colonoscopy every 10 years starting at age 54.  Hepatitis C blood test.  Hepatitis B blood test.  Sexually transmitted disease (STD) testing.  Diabetes screening. This is done by checking your blood sugar (glucose) after you have not eaten for a while (fasting). You may have this done every 1-3 years.  Bone density scan. This is done to screen for osteoporosis. You may have this done starting at age 54.  Mammogram. This may be done every 1-2 years. Talk to your health care provider about how often you should have regular mammograms. Talk with your health care provider about your test results, treatment options, and if necessary, the need for more tests. Vaccines  Your health care provider may recommend certain vaccines, such as:  Influenza vaccine. This is recommended every year.  Tetanus, diphtheria, and acellular pertussis (Tdap, Td) vaccine. You  may need a Td booster every 10 years.  Zoster vaccine. You may need this after age 50.  Pneumococcal 13-valent conjugate (PCV13) vaccine. One dose is recommended after age 56.  Pneumococcal polysaccharide (PPSV23) vaccine. One dose is recommended after  age 33. Talk to your health care provider about which screenings and vaccines you need and how often you need them. This information is not intended to replace advice given to you by your health care provider. Make sure you discuss any questions you have with your health care provider. Document Released: 10/16/2015 Document Revised: 06/08/2016 Document Reviewed: 07/21/2015 Elsevier Interactive Patient Education  2017 [place] Prevention in the Home Falls can cause injuries. They can happen to people of all ages. There are many things you can do to make your home safe and to help prevent falls. What can I do on the outside of my home?  Regularly fix the edges of walkways and driveways and fix any cracks.  Remove anything that might make you trip as you walk through a door, such as a raised step or threshold.  Trim any bushes or trees on the path to your home.  Use bright outdoor lighting.  Clear any walking paths of anything that might make someone trip, such as rocks or tools.  Regularly check to see if handrails are loose or broken. Make sure that both sides of any steps have handrails.  Any raised decks and porches should have guardrails on the edges.  Have any leaves, snow, or ice cleared regularly.  Use sand or salt on walking paths during winter.  Clean up any spills in your garage right away. This includes oil or grease spills. What can I do in the bathroom?  Use night lights.  Install grab bars by the toilet and in the tub and shower. Do not use towel bars as grab bars.  Use non-skid mats or decals in the tub or shower.  If you need to sit down in the shower, use a plastic, non-slip stool.  Keep the floor dry. Clean up any water that spills on the floor as soon as it happens.  Remove soap buildup in the tub or shower regularly.  Attach bath mats securely with double-sided non-slip rug tape.  Do not have throw rugs and other things on the floor that can  make you trip. What can I do in the bedroom?  Use night lights.  Make sure that you have a light by your bed that is easy to reach.  Do not use any sheets or blankets that are too big for your bed. They should not hang down onto the floor.  Have a firm chair that has side arms. You can use this for support while you get dressed.  Do not have throw rugs and other things on the floor that can make you trip. What can I do in the kitchen?  Clean up any spills right away.  Avoid walking on wet floors.  Keep items that you use a lot in easy-to-reach places.  If you need to reach something above you, use a strong step stool that has a grab bar.  Keep electrical cords out of the way.  Do not use floor polish or wax that makes floors slippery. If you must use wax, use non-skid floor wax.  Do not have throw rugs and other things on the floor that can make you trip. What can I do with my stairs?  Do not leave any items  on the stairs.  Make sure that there are handrails on both sides of the stairs and use them. Fix handrails that are broken or loose. Make sure that handrails are as long as the stairways.  Check any carpeting to make sure that it is firmly attached to the stairs. Fix any carpet that is loose or worn.  Avoid having throw rugs at the top or bottom of the stairs. If you do have throw rugs, attach them to the floor with carpet tape.  Make sure that you have a light switch at the top of the stairs and the bottom of the stairs. If you do not have them, ask someone to add them for you. What else can I do to help prevent falls?  Wear shoes that:  Do not have high heels.  Have rubber bottoms.  Are comfortable and fit you well.  Are closed at the toe. Do not wear sandals.  If you use a stepladder:  Make sure that it is fully opened. Do not climb a closed stepladder.  Make sure that both sides of the stepladder are locked into place.  Ask someone to hold it for you,  if possible.  Clearly mark and make sure that you can see:  Any grab bars or handrails.  First and last steps.  Where the edge of each step is.  Use tools that help you move around (mobility aids) if they are needed. These include:  Canes.  Walkers.  Scooters.  Crutches.  Turn on the lights when you go into a dark area. Replace any light bulbs as soon as they burn out.  Set up your furniture so you have a clear path. Avoid moving your furniture around.  If any of your floors are uneven, fix them.  If there are any pets around you, be aware of where they are.  Review your medicines with your doctor. Some medicines can make you feel dizzy. This can increase your chance of falling. Ask your doctor what other things that you can do to help prevent falls. This information is not intended to replace advice given to you by your health care provider. Make sure you discuss any questions you have with your health care provider. Document Released: 07/16/2009 Document Revised: 02/25/2016 Document Reviewed: 10/24/2014 Elsevier Interactive Patient Education  2017 [employer].

## 2018-10-15 NOTE — Progress Notes (Signed)
Subjective:   Kelli [name] is a 73 y.o. female who presents for Medicare Annual (Subsequent) preventive examination.  Review of Systems:    Objective:     Vitals: BP 100/80   Pulse 82   Temp 98.4 F (36.9 C) (Oral)   Ht 4\' 9"  (1.448 m)   Wt 147 lb (66.7 kg)   SpO2 95%   BMI 31.81 kg/m   Body mass index is 31.81 kg/m.  Advanced Directives 10/15/2018 10/11/2017 08/14/2017 09/08/2016 05/30/2016 02/25/2016 01/07/2016  Does Patient Have a Medical Advance Directive? Yes Yes No Yes No No No  Type of Paramedic of [place];Living will [place];Living will - [place]  Does patient want to make changes to medical advance directive? No - Patient declined No - Patient declined - - - - -  Copy of [place] in Chart? No - copy requested No - copy requested - No - copy requested - - -  Would patient like information on creating a medical advance directive? - No - Patient declined No - Patient declined - Yes - Educational materials given - No - patient declined information    Tobacco Social History   Tobacco Use  Smoking Status Former Smoker  . Last attempt to quit: 11/30/1995  . Years since quitting: 22.8  Smokeless Tobacco Never Used     Counseling given: Not Answered   Clinical Intake:   Past Medical History:  Diagnosis Date  . Anxiety   . Cataract    BILATERAL REMOVAL  . Chronic kidney disease    KIDNEY STONES 2 TIMES IN 20 YEARS   . Colon polyp    Tubular Adenoma  . Colon polyp, hyperplastic   . DDD (degenerative disc disease), lumbar   . Diverticulosis   . Flushing   . Hyperlipidemia   . Hypertension   . Hypertonicity of bladder   . Insomnia, unspecified   . Internal hemorrhoid   . Other abnormal blood chemistry   . Sigmoid polyp   . Thyroid disease    Past Surgical History:  Procedure Laterality Date  . [place]   has bilateral silicone implants    . CHOLECYSTECTOMY    . COLONOSCOPY  11/24/2015   Dr. Hilarie Fredrickson  . [place]  . POLYPECTOMY    . ROTATOR CUFF REPAIR  2013   Family History  Problem Relation Age of Onset  . Cancer Mother        lung  . Colon polyps Mother   . Stroke Father   . Colon cancer Neg Hx   . Rectal cancer Neg Hx   . Stomach cancer Neg Hx   . Esophageal cancer Neg Hx    Social History   Socioeconomic History  . Marital status: Widowed    Spouse name: Not on file  . Number of children: Not on file  . Years of education: Not on file  . Highest education level: Not on file  Occupational History  . Occupation: retired  Scientific laboratory technician  . Financial resource strain: Not hard at all  . Food insecurity:    Worry: Never true    Inability: Never true  . Transportation needs:    Medical: No    Non-medical: No  Tobacco Use  . Smoking status: Former Smoker    Last attempt to quit: 11/30/1995    Years since quitting: 22.8  . Smokeless tobacco:  Never Used  Substance and Sexual Activity  . Alcohol use: No    Alcohol/week: 0.0 standard drinks  . Drug use: No  . Sexual activity: Never  Lifestyle  . Physical activity:    Days per week: 0 days    Minutes per session: 0 min  . Stress: Only a little  Relationships  . Social connections:    Talks on phone: More than three times a week    Gets together: Once a week    Attends religious service: Never    Active member of club or organization: No    Attends meetings of clubs or organizations: Never    Relationship status: Widowed  Other Topics Concern  . Not on file  Social History Narrative   Widowed   Former [place] stopped   Alcohol once a year   [place]ise 2-3 times a week goes to gym    Outpatient Encounter Medications as of 10/15/2018  Medication Sig  . ALPRAZolam (XANAX) 1 MG tablet TAKE 2 TABLETS BY MOUTH ONCE DAILY AT BEDTIME AS NEEDED FOR ANXIETY  . aspirin EC 81 MG tablet Take 1 tablet (81 mg total) by mouth daily.  Marland Kitchen  buPROPion (WELLBUTRIN XL) 300 MG 24 hr tablet Take 1 tablet (300 mg total) by mouth daily.  . diclofenac sodium (VOLTAREN) 1 % GEL Apply 2 g topically 4 (four) times daily. To right shoulder for osteoarthritis  . estradiol (ESTRACE) 0.5 MG tablet Take 1 tablet (0.5 mg total) by mouth daily.  . fexofenadine (ALLEGRA ALLERGY) 180 MG tablet Take 1 tablet (180 mg total) by mouth daily.  Marland Kitchen ketoconazole (NIZORAL) 2 % cream  APPLY ONE APPLICATION TOPICALLY DAILY  . ketoconazole (NIZORAL) 2 % shampoo  APPLY ONE APPLICATION TOPICALLY TWO TIMES A WEEK  . levothyroxine (SYNTHROID, LEVOTHROID) 88 MCG tablet TAKE 1 TABLET BY MOUTH ONCE DAILY  . losartan (COZAAR) 25 MG tablet TAKE 1 TABLET BY MOUTH ONCE DAILY  . medroxyPROGESTERone (PROVERA) 2.5 MG tablet Take 2.5 mg by mouth daily.  . polyethylene glycol powder (GLYCOLAX/MIRALAX) powder TAKE ONE CAPFUL BY MOUTH TWICE DAILY AS NEEDED  . simvastatin (ZOCOR) 40 MG tablet TAKE 1 TABLET BY MOUTH ONCE DAILY FOR CHOLESTEROL  . Tdap (BOOSTRIX) 5-2.5-18.5 LF-MCG/0.5 injection Inject 0.5 mLs into the muscle once for 1 dose.  Marland Kitchen tiZANidine (ZANAFLEX) 2 MG tablet Take one tablet by mouth daily as needed for muscle spasms.  Marland Kitchen zolpidem (AMBIEN) 5 MG tablet Take 1 tablet (5 mg total) by mouth at bedtime.  . [DISCONTINUED] Tdap (BOOSTRIX) 5-2.5-18.5 LF-MCG/0.5 injection Inject 0.5 mLs into the muscle once.   No facility-administered encounter medications on file as of 10/15/2018.     Activities of Daily Living No flowsheet data found.  Patient Care Team: Gayland Curry, DO as PCP - General (Geriatric Medicine)    Assessment:   This is a routine wellness examination for [place].  Exercise Activities and Dietary recommendations    Goals    . Exercise 3x per week (30 min per time)     Starting 09/08/16, I will maintain my current exercise routine of 2-3 times per week at the gym. I would like weigh 125lb    . Exercise 3x per week (30 min per time)     Patient  would like to go back to the gym 2-3 times a week       Fall Risk Fall Risk  10/15/2018 12/21/2017 10/11/2017 08/14/2017 05/11/2017  Falls in the past year? 0 No No  No No  Number falls in past yr: 0 - - - -  Injury with Fall? 0 - - - -   Is the patient's home free of loose throw rugs in walkways, pet beds, electrical cords, etc?   yes      Grab bars in the bathroom? no      Handrails on the stairs?   no      Adequate lighting?   yes   Depression Screen PHQ 2/9 Scores 10/15/2018 05/03/2018 10/11/2017 08/14/2017  PHQ - 2 Score 0 0 0 0  PHQ- 9 Score - - - -     Cognitive Function MMSE - Mini Mental State Exam 10/15/2018 10/11/2017 09/08/2016 12/26/2013  Orientation to time 5 5 5 5   Orientation to Place 4 5 5 5   Registration 3 3 3 3   Attention/ Calculation 5 5 3 5   Recall 3 3 3 2   Language- name 2 objects 2 2 2 2   Language- repeat 1 1 1 1   Language- follow 3 step command 3 2 3 3   Language- read & follow direction 1 1 1 1   Write a sentence 1 1 1 1   Copy design 1 1 1 1   Total score 29 29 28 29     Immunization History  Administered Date(s) Administered  . Influenza Inj Mdck Quad With Preservative 07/03/2017  . Influenza, High Dose Seasonal PF 08/14/2017, 07/16/2018  . Influenza,inj,Quad PF,6+ Mos 07/08/2013, 08/04/2014, 08/31/2015, 05/30/2016  . Pneumococcal Conjugate-13 01/05/2015  . Pneumococcal Polysaccharide-23 10/03/2008, 09/08/2016  . Td 10/03/2008  . Zoster 10/03/2008    Qualifies for Shingles Vaccine? Yes   Screening Tests Health Maintenance  Topic Date Due  . TETANUS/TDAP  10/03/2018  . COLONOSCOPY  11/23/2018  . INFLUENZA VACCINE  Completed  . DEXA SCAN  Completed  . Hepatitis C Screening  Completed  . PNA vac Low Risk Adult  Completed    Cancer Screenings: Lung: Low Dose CT Chest recommended if Age 75-80 years, 30 pack-year currently smoking OR have quit w/in 15years. Patient does not qualify. Breast:  Up to date on Mammogram? No   Up to date of Bone  Density/Dexa? Yes Colorectal: Up to date   Additional Screenings: Hepatitis C Screening:   Plan:   I have personally reviewed and noted the following in the patient's chart:   . Medical and social history . Use of alcohol, tobacco or illicit drugs  . Current medications and supplements . Functional ability and status . Nutritional status . Physical activity . Advanced directives . List of other physicians . Hospitalizations, surgeries, and ER visits in previous 12 months . Vitals . Screenings to include cognitive, depression, and falls . Referrals and appointments  In addition, I have reviewed and discussed with patient certain preventive protocols, quality metrics, and best practice recommendations. A written personalized care plan for preventive services as well as general preventive health recommendations were provided to patient.   Sandrea Hughs, NP  10/15/2018

## 2018-10-18 ENCOUNTER — Other Ambulatory Visit: Payer: Medicare Other

## 2018-10-18 DIAGNOSIS — N183 Chronic kidney disease, stage 3 unspecified: Secondary | ICD-10-CM

## 2018-10-18 DIAGNOSIS — R7303 Prediabetes: Secondary | ICD-10-CM

## 2018-10-18 DIAGNOSIS — E782 Mixed hyperlipidemia: Secondary | ICD-10-CM

## 2018-10-18 DIAGNOSIS — E039 Hypothyroidism, unspecified: Secondary | ICD-10-CM | POA: Diagnosis not present

## 2018-10-19 LAB — COMPLETE METABOLIC PANEL WITH GFR
AG Ratio: 1.1 (calc) (ref 1.0–2.5)
ALT: 13 U/L (ref 6–29)
AST: 16 U/L (ref 10–35)
Albumin: 3.9 g/dL (ref 3.6–5.1)
Alkaline phosphatase (APISO): 49 U/L (ref 33–130)
BUN: 17 mg/dL (ref 7–25)
CO2: 27 mmol/L (ref 20–32)
Calcium: 9.2 mg/dL (ref 8.6–10.4)
Chloride: 106 mmol/L (ref 98–110)
Creat: 0.75 mg/dL (ref 0.60–0.93)
GFR, Est African American: 90 mL/min/{1.73_m2} (ref >=60)
GFR, Est Non African American: 78 mL/min/{1.73_m2} (ref >=60)
Globulin: 3.7 g/dL (calc) (ref 1.9–3.7)
Glucose, Bld: 118 mg/dL — ABNORMAL HIGH (ref 65–99)
Potassium: 4 mmol/L (ref 3.5–5.3)
Sodium: 141 mmol/L (ref 135–146)
Total Bilirubin: 0.5 mg/dL (ref 0.2–1.2)
Total Protein: 7.6 g/dL (ref 6.1–8.1)

## 2018-10-19 LAB — CBC WITH DIFFERENTIAL/PLATELET
Absolute Monocytes: 525 cells/uL (ref 200–950)
Basophils Absolute: 28 cells/uL (ref 0–200)
Basophils Relative: 0.4 %
Eosinophils Absolute: 7 cells/uL — ABNORMAL LOW (ref 15–500)
Eosinophils Relative: 0.1 %
HCT: 39.3 % (ref 35.0–45.0)
Hemoglobin: 13.2 g/dL (ref 11.7–15.5)
Lymphs Abs: 2492 cells/uL (ref 850–3900)
MCH: 30.6 pg (ref 27.0–33.0)
MCHC: 33.6 g/dL (ref 32.0–36.0)
MCV: 91.2 fL (ref 80.0–100.0)
MPV: 9.3 fL (ref 7.5–12.5)
Monocytes Relative: 7.4 %
Neutro Abs: 4047 cells/uL (ref 1500–7800)
Neutrophils Relative %: 57 %
Platelets: 263 10*3/uL (ref 140–400)
RBC: 4.31 10*6/uL (ref 3.80–5.10)
RDW: 13 % (ref 11.0–15.0)
Total Lymphocyte: 35.1 %
WBC: 7.1 10*3/uL (ref 3.8–10.8)

## 2018-10-19 LAB — TSH: TSH: 7.42 mIU/L — ABNORMAL HIGH (ref 0.40–4.50)

## 2018-10-19 LAB — LIPID PANEL
Cholesterol: 180 mg/dL (ref <200)
HDL: 48 mg/dL — ABNORMAL LOW (ref >50)
LDL Cholesterol (Calc): 107 mg/dL (calc) — ABNORMAL HIGH
Non-HDL Cholesterol (Calc): 132 mg/dL (calc) — ABNORMAL HIGH (ref <130)
Total CHOL/HDL Ratio: 3.8 (calc) (ref <5.0)
Triglycerides: 140 mg/dL (ref <150)

## 2018-10-19 LAB — HEMOGLOBIN A1C
Hgb A1c MFr Bld: 6.1 % of total Hgb — ABNORMAL HIGH (ref <5.7)
Mean Plasma Glucose: 128 (calc)
eAG (mmol/L): 7.1 (calc)

## 2018-10-22 ENCOUNTER — Telehealth: Payer: Self-pay | Admitting: *Deleted

## 2018-10-22 NOTE — Telephone Encounter (Signed)
Ok good.  That should get her TSH back to normal.

## 2018-10-22 NOTE — Telephone Encounter (Signed)
Patient called and stated that she just wanted to let you know she has been taking her thyroid medication at night instead of in the morning. Stated that she has missed a couple of doses and wanted to let you know she is going back to taking it every morning.

## 2018-11-05 ENCOUNTER — Other Ambulatory Visit: Payer: Self-pay | Admitting: Internal Medicine

## 2018-11-05 DIAGNOSIS — F5104 Psychophysiologic insomnia: Secondary | ICD-10-CM

## 2018-11-07 DIAGNOSIS — L218 Other seborrheic dermatitis: Secondary | ICD-10-CM | POA: Diagnosis not present

## 2018-11-14 DIAGNOSIS — H40013 Open angle with borderline findings, low risk, bilateral: Secondary | ICD-10-CM | POA: Diagnosis not present

## 2018-12-01 ENCOUNTER — Encounter: Payer: Self-pay | Admitting: Internal Medicine

## 2018-12-06 ENCOUNTER — Other Ambulatory Visit: Payer: Self-pay | Admitting: Internal Medicine

## 2018-12-06 DIAGNOSIS — F5104 Psychophysiologic insomnia: Secondary | ICD-10-CM

## 2018-12-06 MED ORDER — LEVOTHYROXINE SODIUM 88 MCG PO TABS: 88.0000 ug | ORAL_TABLET | Freq: Every day | ORAL | 1 refills | Status: DC

## 2018-12-06 MED ORDER — SIMVASTATIN 40 MG PO TABS: ORAL_TABLET | ORAL | 1 refills | Status: DC

## 2018-12-06 NOTE — Telephone Encounter (Signed)
[facility]  [place] Database Verified LR: 11/06/2018

## 2019-01-03 ENCOUNTER — Other Ambulatory Visit: Payer: Self-pay | Admitting: Internal Medicine

## 2019-01-03 DIAGNOSIS — F5104 Psychophysiologic insomnia: Secondary | ICD-10-CM

## 2019-01-03 NOTE — Telephone Encounter (Signed)
[place] Database verified and compliance confirmed   Both medications last filled on 12/06/2018

## 2019-01-12 ENCOUNTER — Other Ambulatory Visit: Payer: Self-pay | Admitting: Internal Medicine

## 2019-02-03 ENCOUNTER — Other Ambulatory Visit: Payer: Self-pay | Admitting: Internal Medicine

## 2019-02-03 DIAGNOSIS — F5104 Psychophysiologic insomnia: Secondary | ICD-10-CM

## 2019-02-04 NOTE — Telephone Encounter (Signed)
[place] Database verified and compliance confirmed   Both medications last filled 01/03/2019

## 2019-02-04 NOTE — Telephone Encounter (Signed)
Last refill 01/03/2019 [place] Database verified  Routed to provider for approval

## 2019-02-14 ENCOUNTER — Encounter: Payer: Self-pay | Admitting: Internal Medicine

## 2019-02-14 ENCOUNTER — Ambulatory Visit (INDEPENDENT_AMBULATORY_CARE_PROVIDER_SITE_OTHER): Payer: Medicare Other | Admitting: Internal Medicine

## 2019-02-14 ENCOUNTER — Other Ambulatory Visit: Payer: Self-pay

## 2019-02-14 DIAGNOSIS — E039 Hypothyroidism, unspecified: Secondary | ICD-10-CM

## 2019-02-14 DIAGNOSIS — I129 Hypertensive chronic kidney disease with stage 1 through stage 4 chronic kidney disease, or unspecified chronic kidney disease: Secondary | ICD-10-CM

## 2019-02-14 DIAGNOSIS — E782 Mixed hyperlipidemia: Secondary | ICD-10-CM

## 2019-02-14 DIAGNOSIS — N183 Chronic kidney disease, stage 3 unspecified: Secondary | ICD-10-CM

## 2019-02-14 DIAGNOSIS — I10 Essential (primary) hypertension: Secondary | ICD-10-CM

## 2019-02-14 DIAGNOSIS — R7303 Prediabetes: Secondary | ICD-10-CM

## 2019-02-14 NOTE — Patient Instructions (Signed)
Continue to try to stay active with exercise at home, walking and healthy eating habits.

## 2019-02-14 NOTE — Progress Notes (Signed)
Patient ID: Kelli [name], female   DOB: [birth year], 73 y.o.   MRN: [id number] This service is provided via telemedicine  No vital signs collected/recorded due to the encounter was a telemedicine visit.   Location of patient (ex: home, work): home   Patient consents to a telephone visit:  yes  Location of the provider (ex: office, home):  office  Name of any referring provider:  Hollace Kinnier, DO  Names of all persons participating in the telemedicine service and their role in the encounter:  Patient, DeShannon Smith,CMA, Hollace Kinnier, DO  Time spent on call:  5:42  Virtual Visit via Video Note  I connected with Kelli [name] on 02/14/19 at  1:00 PM EDT by a video enabled telemedicine application and verified that I am speaking with the correct person using two identifiers.  Location: Patient: Home Provider: Office   I discussed the limitations of evaluation and management by telemedicine and the availability of in person appointments. The patient expressed understanding and agreed to proceed.  History of Present Illness: 73 yo female seen via iphone virtual visit.  She is doing a little bit of exercise.  Admits she needs it to be regular.  Has weights and ropes.  Tries to keep her legs and arms moving.    She is eating a lot of berries.     The only time she left her house since March 12th was [facility].  [name] brings her groceries to the porch.  They stay distant and wear masks.  She is afraid to go to [facility] b/c some don't wear masks.  She's trying to avoid people.  She is frustrated with family who don't take this so seriously.    Mood has been ok.  It didn't bother her much at first, but sometimes thinks of things she would like to get that she can't get.  There are some things only she can get for herself that she now has to do w/o but not essentials. The cats help her.  They are sleeping somewhere.  There are three now.    She has a friend  she can talk with online who has similar political perspectives.  She ordered three masks.  She asks how can she make them more beneficial.  We discussed adding a fabric mask on top or using multiple masks, a coffee filter or a paper towel inside.  cscope got canceled and will need to be rescheduled.  She had 10 polyps last time but she needs the 3 year recheck.  Observations/Objective: BP was 115/66 Wt 143 lbs (down 4 lbs from last time) Healthy appearing NAD  Assessment and Plan: 1. Essential hypertension, benign -bp at goal at home -cont same regimen which includes ARB  2. Hypothyroidism, unspecified type -needs tsh rechecked before next visit Lab Results  Component Value Date   TSH 7.42 (H) 10/18/2018    3. Prediabetes -f/u hba1c before next visit, work on diet and exercise regimen  4. Kidney disease, chronic, stage III (GFR 30-59 ml/min) (HCC) -Avoid nephrotoxic agents like nsaids, dose adjust renally excreted meds, hydrate.  5. Mixed hyperlipidemia -f/u lipids before we meet again  Follow Up Instructions: Continue to try to stay active with exercise at home, walking and healthy eating habits.    I discussed the assessment and treatment plan with the patient. The patient was provided an opportunity to ask questions and all were answered. The patient agreed with the plan and demonstrated an  understanding of the instructions.   The patient was advised to call back or seek an in-person evaluation if the symptoms worsen or if the condition fails to improve as anticipated.  I provided 27 minutes of non-face-to-face time during this encounter.   Hollace Kinnier, DO

## 2019-03-25 ENCOUNTER — Other Ambulatory Visit: Payer: Self-pay | Admitting: Internal Medicine

## 2019-03-25 DIAGNOSIS — F329 Major depressive disorder, single episode, unspecified: Secondary | ICD-10-CM

## 2019-03-25 DIAGNOSIS — F32A Depression, unspecified: Secondary | ICD-10-CM

## 2019-05-09 ENCOUNTER — Other Ambulatory Visit: Payer: Medicare Other

## 2019-05-09 ENCOUNTER — Other Ambulatory Visit: Payer: Self-pay

## 2019-05-09 DIAGNOSIS — E782 Mixed hyperlipidemia: Secondary | ICD-10-CM

## 2019-05-09 DIAGNOSIS — R7303 Prediabetes: Secondary | ICD-10-CM

## 2019-05-09 DIAGNOSIS — E039 Hypothyroidism, unspecified: Secondary | ICD-10-CM | POA: Diagnosis not present

## 2019-05-10 LAB — HEMOGLOBIN A1C
Hgb A1c MFr Bld: 5.9 % of total Hgb — ABNORMAL HIGH (ref <5.7)
Mean Plasma Glucose: 123 (calc)
eAG (mmol/L): 6.8 (calc)

## 2019-05-10 LAB — LIPID PANEL
Cholesterol: 167 mg/dL (ref <200)
HDL: 48 mg/dL — ABNORMAL LOW (ref >=50)
LDL Cholesterol (Calc): 96 mg/dL (calc)
Non-HDL Cholesterol (Calc): 119 mg/dL (calc) (ref <130)
Total CHOL/HDL Ratio: 3.5 (calc) (ref <5.0)
Triglycerides: 126 mg/dL (ref <150)

## 2019-05-10 LAB — TSH: TSH: 0.72 mIU/L (ref 0.40–4.50)

## 2019-05-16 ENCOUNTER — Ambulatory Visit: Payer: Self-pay | Admitting: Internal Medicine

## 2019-05-16 ENCOUNTER — Telehealth: Payer: Self-pay

## 2019-05-16 NOTE — Telephone Encounter (Signed)
Tried calling patient to see if she was willing to be seen by Dinah today. Dr. Cyndi Lennert scheduled is overbooked. LMOM for patient to return call.

## 2019-05-27 ENCOUNTER — Encounter: Payer: Self-pay | Admitting: Internal Medicine

## 2019-05-27 ENCOUNTER — Ambulatory Visit (INDEPENDENT_AMBULATORY_CARE_PROVIDER_SITE_OTHER): Payer: Medicare Other | Admitting: Internal Medicine

## 2019-05-27 ENCOUNTER — Other Ambulatory Visit: Payer: Self-pay

## 2019-05-27 VITALS — BP 118/70 | HR 86 | Temp 98.1°F | Ht <= 58 in | Wt 143.0 lb

## 2019-05-27 DIAGNOSIS — F329 Major depressive disorder, single episode, unspecified: Secondary | ICD-10-CM

## 2019-05-27 DIAGNOSIS — F32A Depression, unspecified: Secondary | ICD-10-CM

## 2019-05-27 DIAGNOSIS — E782 Mixed hyperlipidemia: Secondary | ICD-10-CM

## 2019-05-27 DIAGNOSIS — Z23 Encounter for immunization: Secondary | ICD-10-CM | POA: Diagnosis not present

## 2019-05-27 DIAGNOSIS — R7303 Prediabetes: Secondary | ICD-10-CM

## 2019-05-27 DIAGNOSIS — F419 Anxiety disorder, unspecified: Secondary | ICD-10-CM | POA: Diagnosis not present

## 2019-05-27 DIAGNOSIS — E039 Hypothyroidism, unspecified: Secondary | ICD-10-CM

## 2019-05-27 DIAGNOSIS — I10 Essential (primary) hypertension: Secondary | ICD-10-CM | POA: Diagnosis not present

## 2019-05-27 NOTE — Progress Notes (Signed)
Location:  [facility] clinic Provider:  Elizette Shek L. Mariea Clonts, D.O., C.M.D.  Goals of Care:  Advanced Directives 10/15/2018  Does Patient Have a Medical Advance Directive? Yes  Type of Paramedic of [place];Living will  Does patient want to make changes to medical advance directive? No - Patient declined  Copy of [place] in Chart? No - copy requested  Would patient like information on creating a medical advance directive? -   Chief Complaint  Patient presents with  . Medical Management of Chronic Issues    33mth follow-up    HPI: Patient is a 73 y.o. female seen today for medical management of chronic diseases.    Improved cholesterol and sugar average.  hba1c 5.9.  Diet remains limited due to teeth.  Eats mostly fruit, berries, chicken, sweet potatoes and salmon.  She has laid off the ice cream (less) and quit the chocolate altogether.    Her daughter has been doing her shopping for her and putting her food on the porch.  They talk while she shops to make sure she gets what she needs.  She's socially distancing very well.  [facility] and here.    BP is great today.    She's going to have someone to take down a tree today so she's had people coming to give estimates.    She accepts her flu shot.    Her one cat loved her daughter when she was able to visit so she gave him to her.  She still has the other two cats.  She still gets to see videos.    Past Medical History:  Diagnosis Date  . Anxiety   . Cataract    BILATERAL REMOVAL  . Chronic kidney disease    KIDNEY STONES 2 TIMES IN 20 YEARS   . Colon polyp    Tubular Adenoma  . Colon polyp, hyperplastic   . DDD (degenerative disc disease), lumbar   . Diverticulosis   . Flushing   . Hyperlipidemia   . Hypertension   . Hypertonicity of bladder   . Insomnia, unspecified   . Internal hemorrhoid   . Other abnormal blood chemistry   . Sigmoid polyp   . Thyroid  disease     Past Surgical History:  Procedure Laterality Date  . [place]   has bilateral silicone implants  . CHOLECYSTECTOMY    . COLONOSCOPY  11/24/2015   Dr. Hilarie Fredrickson  . [place]  . POLYPECTOMY    . ROTATOR CUFF REPAIR  2013    Allergies  Allergen Reactions  . Latex Swelling    Outpatient Encounter Medications as of 05/27/2019  Medication Sig  . ALPRAZolam (XANAX) 1 MG tablet TAKE 2 TABLETS BY MOUTH EVERY NIGHT AT BEDTIME AS NEEDED FOR ANXIETY  . aspirin EC 81 MG tablet Take 1 tablet (81 mg total) by mouth daily.  Marland Kitchen buPROPion (WELLBUTRIN XL) 300 MG 24 hr tablet TAKE 1 TABLET(300 MG) BY MOUTH DAILY  . diclofenac sodium (VOLTAREN) 1 % GEL Apply 2 g topically 4 (four) times daily. To right shoulder for osteoarthritis  . estradiol (ESTRACE) 1 MG tablet Take 1 tablet by mouth daily.  . fexofenadine (ALLEGRA ALLERGY) 180 MG tablet Take 1 tablet (180 mg total) by mouth daily.  Marland Kitchen ketoconazole (NIZORAL) 2 % cream  APPLY ONE APPLICATION TOPICALLY DAILY  . ketoconazole (NIZORAL) 2 % shampoo  APPLY ONE APPLICATION TOPICALLY TWO  TIMES A WEEK  . levothyroxine (SYNTHROID, LEVOTHROID) 88 MCG tablet Take 1 tablet (88 mcg total) by mouth daily.  Marland Kitchen losartan (COZAAR) 25 MG tablet TAKE 1 TABLET BY MOUTH EVERY DAY  . medroxyPROGESTERone (PROVERA) 2.5 MG tablet Take 2.5 mg by mouth daily.  . polyethylene glycol powder (GLYCOLAX/MIRALAX) powder TAKE ONE CAPFUL BY MOUTH TWICE DAILY AS NEEDED  . simvastatin (ZOCOR) 40 MG tablet TAKE 1 TABLET BY MOUTH ONCE DAILY FOR CHOLESTEROL  . tiZANidine (ZANAFLEX) 2 MG tablet Take one tablet by mouth daily as needed for muscle spasms.  Marland Kitchen zolpidem (AMBIEN) 5 MG tablet TAKE 1 TABLET BY MOUTH EVERY NIGHT AT BEDTIME   No facility-administered encounter medications on file as of 05/27/2019.     Review of Systems:  Review of Systems  Constitutional: Negative for chills and fever.  HENT: Positive for hearing loss. Negative  for congestion.   Eyes: Negative for blurred vision.  Respiratory: Negative for cough and shortness of breath.   Cardiovascular: Negative for chest pain, palpitations and leg swelling.  Gastrointestinal: Negative for abdominal pain, blood in stool, constipation, diarrhea and melena.  Genitourinary: Negative for dysuria.  Musculoskeletal: Negative for falls and joint pain.  Skin: Negative for itching and rash.  Neurological: Negative for dizziness and loss of consciousness.  Psychiatric/Behavioral: Negative for depression and memory loss. The patient is not nervous/anxious and does not have insomnia.        All improved lately    Health Maintenance  Topic Date Due  . TETANUS/TDAP  10/03/2018  . COLONOSCOPY  11/23/2018  . INFLUENZA VACCINE  05/04/2019  . DEXA SCAN  Completed  . Hepatitis C Screening  Completed  . PNA vac Low Risk Adult  Completed    Physical Exam: Vitals:   05/27/19 1528  BP: 118/70  Pulse: 86  Temp: 98.1 F (36.7 C)  TempSrc: Tympanic  SpO2: 96%  Weight: 143 lb (64.9 kg)  Height: 4\' 9"  (1.448 m)   Body mass index is 30.94 kg/m. Physical Exam Vitals signs reviewed.  Constitutional:      General: She is not in acute distress.    Appearance: Normal appearance. She is not toxic-appearing.  HENT:     Head: Normocephalic and atraumatic.  Cardiovascular:     Rate and Rhythm: Normal rate and regular rhythm.     Pulses: Normal pulses.     Heart sounds: Normal heart sounds.  Pulmonary:     Effort: Pulmonary effort is normal.     Breath sounds: Normal breath sounds.  Abdominal:     General: Bowel sounds are normal.  Musculoskeletal: Normal range of motion.     Right lower leg: No edema.     Left lower leg: No edema.  Skin:    General: Skin is warm and dry.     Coloration: Skin is pale.     Comments: Dry scaly patches behind ears, base of neck and in scalp  Neurological:     General: No focal deficit present.     Mental Status: She is alert and  oriented to person, place, and time.     Cranial Nerves: No cranial nerve deficit.  Psychiatric:        Mood and Affect: Mood normal.        Behavior: Behavior normal.     Labs reviewed: Basic Metabolic Panel: Recent Labs    10/18/18 1343 05/09/19 1353  NA 141  --   K 4.0  --   CL 106  --  CO2 27  --   GLUCOSE 118*  --   BUN 17  --   CREATININE 0.75  --   CALCIUM 9.2  --   TSH 7.42* 0.72   Liver Function Tests: Recent Labs    10/18/18 1343  AST 16  ALT 13  BILITOT 0.5  PROT 7.6   No results for input(s): LIPASE, AMYLASE in the last 8760 hours. No results for input(s): AMMONIA in the last 8760 hours. CBC: Recent Labs    10/18/18 1343  WBC 7.1  NEUTROABS 4,047  HGB 13.2  HCT 39.3  MCV 91.2  PLT 263   Lipid Panel: Recent Labs    10/18/18 1343 05/09/19 1353  CHOL 180 167  HDL 48* 48*  LDLCALC 107* 96  TRIG 140 126  CHOLHDL 3.8 3.5   Lab Results  Component Value Date   HGBA1C 5.9 (H) 05/09/2019   \Assessment/Plan 1. Prediabetes - continue to improved diet as she has recently - encourage regular walking and stretch band use - Hemoglobin A1c; Future - CBC with Differential/Platelet; Future - COMPLETE METABOLIC PANEL WITH GFR; Future  2. Essential hypertension, benign -well controlled with current regimen, cont same - CBC with Differential/Platelet; Future - COMPLETE METABOLIC PANEL WITH GFR; Future  3. Hypothyroidism, unspecified type -last tsh wnl, euthyroid clinically, as well -cont same dose of levothyroxine and monitor  4. Mixed hyperlipidemia -improved considerably--at goal, cont improved diet, monitor - Lipid panel; Future  5. Anxiety and depression -controled with current regimen, seems to be doing better with more regular communication with her daughter and having two cats  6. Need for influenza vaccination - Flu Vaccine QUAD High Dose(Fluad) given   Labs/tests ordered:   Lab Orders     Hemoglobin A1c     CBC with  Differential/Platelet     COMPLETE METABOLIC PANEL WITH GFR     Lipid panel  Next appt:  4 mos med mgt, fasting labs before   Ovid Witman L. Ma Munoz, D.O. [facility] [address], [place] [address] Cell Phone (Mon-Fri 8am-5pm):  [phone] On Call:  [phone] & follow prompts after 5pm & weekends Office Phone:  [phone] Office Fax:  [phone]

## 2019-05-27 NOTE — Patient Instructions (Signed)
Keep up the good work with your diet.   Try using the exercise bands for some workout.  Walking is also good for you.

## 2019-06-03 ENCOUNTER — Encounter: Payer: Self-pay | Admitting: Internal Medicine

## 2019-06-06 ENCOUNTER — Other Ambulatory Visit: Payer: Self-pay | Admitting: Internal Medicine

## 2019-06-08 ENCOUNTER — Other Ambulatory Visit: Payer: Self-pay | Admitting: Internal Medicine

## 2019-07-15 ENCOUNTER — Other Ambulatory Visit: Payer: Self-pay | Admitting: *Deleted

## 2019-07-15 MED ORDER — LOSARTAN POTASSIUM 25 MG PO TABS: 25.0000 mg | ORAL_TABLET | Freq: Every day | ORAL | 1 refills | Status: DC

## 2019-07-15 NOTE — Telephone Encounter (Signed)
[facility] 

## 2019-07-19 ENCOUNTER — Other Ambulatory Visit: Payer: Self-pay

## 2019-07-19 ENCOUNTER — Ambulatory Visit (AMBULATORY_SURGERY_CENTER): Payer: Self-pay | Admitting: *Deleted

## 2019-07-19 VITALS — Temp 96.2°F | Ht <= 58 in | Wt 144.0 lb

## 2019-07-19 DIAGNOSIS — Z86010 Personal history of colonic polyps: Secondary | ICD-10-CM

## 2019-07-19 MED ORDER — NA SULFATE-K SULFATE-MG SULF 17.5-3.13-1.6 GM/177ML PO SOLN: ORAL | 0 refills | Status: DC

## 2019-07-19 NOTE — Progress Notes (Signed)
Patient is here in-person for PV. Patient denies any allergies to eggs or soy. Patient denies any problems with anesthesia/sedation. Patient denies any oxygen use at home. Patient denies taking any diet/weight loss medications or blood thinners. Patient is not being treated for MRSA or C-diff. EMMI education assisgned to patient on colonoscopy, this was explained and instructions given to patient.   Pt is aware that care partner will wait in the car during procedure; if they feel like they will be too hot or cold to wait in the car; they may wait in the 4 th floor lobby. Patient is aware to bring only one care partner. We want them to wear a mask (we do not have any that we can provide them), practice social distancing, and we will check their temperatures when they get here.  I did remind the patient that their care partner needs to stay in the parking lot the entire time and have a cell phone available, we will call them when the pt is ready for discharge. Patient will wear mask into building.  Pt declined Covid testing.

## 2019-07-23 ENCOUNTER — Telehealth: Payer: Self-pay | Admitting: *Deleted

## 2019-07-23 NOTE — Telephone Encounter (Signed)
Patient called and stated that she has lost her 90 day supply of medication of Losartan.  Stated that she called the pharmacy and they will only cover a 30 day supply and insurance will not pay. Stated that it was going to be over $30, and she will still be short 2 months without it.   Patient wants to know if you can change it to something else or if it would be ok to NOT take it for 3 months. Please Advise.

## 2019-07-23 NOTE — Telephone Encounter (Addendum)
Patient called back and stated that she has found her medication. Stated she has been looking for it all day.

## 2019-07-23 NOTE — Telephone Encounter (Signed)
Can you take a look in the sample closet and see what we have?  Perhaps we have another ARB medication (the ones that end in "artan") in there that we could use to get her through?  Of course we can send one month's worth of losartan if she can afford to get that also.

## 2019-07-24 NOTE — Telephone Encounter (Signed)
Great news.

## 2019-07-30 ENCOUNTER — Telehealth: Payer: Self-pay | Admitting: Internal Medicine

## 2019-07-30 NOTE — Telephone Encounter (Signed)
Pt has questions regarding prep instructions and would like to discuss.

## 2019-07-30 NOTE — Telephone Encounter (Signed)
Patient had questions regarding 2 day prep. Discussed with the patient diet and prep on 07/31/2019. Patient verbalized understanding. Patient encouraged to call back with any questions.

## 2019-08-01 ENCOUNTER — Telehealth: Payer: Self-pay | Admitting: Internal Medicine

## 2019-08-01 NOTE — Telephone Encounter (Signed)
Pt is scheduled for a 08/02/19 colon and reported that she has been vomiting.

## 2019-08-01 NOTE — Telephone Encounter (Signed)
Pt called to report that after drinking the miralax last night she vomited several times.  She is a 2 day prep.  She has moved her bowels through the night and this morning.  She is no longer nauseated.  She feels that she should be able to drink the suprep this evening but just wanted to make [place] aware of the vomiting last night.  I explained to her that since she has been moving her bowels that it is ok.  I offered to ask Dr. Hilarie Fredrickson if we could call in a med for nausea and she states that she is no longer nauseated and does not need it.  Encouraged her to drink plenty of clear liquids today and advised her to call back with any further questions or issues.  Pt. Verbalized understanding.

## 2019-08-02 ENCOUNTER — Ambulatory Visit (AMBULATORY_SURGERY_CENTER): Payer: Medicare Other | Admitting: Internal Medicine

## 2019-08-02 ENCOUNTER — Other Ambulatory Visit: Payer: Self-pay

## 2019-08-02 ENCOUNTER — Encounter: Payer: Self-pay | Admitting: Internal Medicine

## 2019-08-02 ENCOUNTER — Other Ambulatory Visit: Payer: Self-pay | Admitting: Internal Medicine

## 2019-08-02 VITALS — BP 106/52 | HR 65 | Temp 98.4°F | Resp 16 | Ht <= 58 in | Wt 144.0 lb

## 2019-08-02 DIAGNOSIS — D123 Benign neoplasm of transverse colon: Secondary | ICD-10-CM

## 2019-08-02 DIAGNOSIS — D122 Benign neoplasm of ascending colon: Secondary | ICD-10-CM | POA: Diagnosis not present

## 2019-08-02 DIAGNOSIS — D124 Benign neoplasm of descending colon: Secondary | ICD-10-CM | POA: Diagnosis not present

## 2019-08-02 DIAGNOSIS — Z86010 Personal history of colonic polyps: Secondary | ICD-10-CM | POA: Diagnosis not present

## 2019-08-02 DIAGNOSIS — Z1211 Encounter for screening for malignant neoplasm of colon: Secondary | ICD-10-CM | POA: Diagnosis not present

## 2019-08-02 MED ORDER — SODIUM CHLORIDE 0.9 % IV SOLN: 500.0000 mL | Freq: Once | INTRAVENOUS | Status: DC

## 2019-08-02 NOTE — Progress Notes (Signed)
Pt's states no medical or surgical changes since previsit or office visit. VS done by CW. Temp by LC.

## 2019-08-02 NOTE — Op Note (Signed)
[place] Patient Name: [place] Procedure Date: 08/02/2019 10:59 AM MRN: [id number] Endoscopist: Jerene Bears , MD Age: 73 Referring MD:  Date of Birth: [birth year] Gender: Female Account #: [id number] Procedure:                Colonoscopy Indications:              High risk colon cancer surveillance: Personal                            history of multiple (3 or more) adenomas, Last                            colonoscopy 3 years ago Medicines:                Monitored Anesthesia Care Procedure:                Pre-Anesthesia Assessment:                           - Prior to the procedure, a History and Physical                            was performed, and patient medications and                            allergies were reviewed. The patient's tolerance of                            previous anesthesia was also reviewed. The risks                            and benefits of the procedure and the sedation                            options and risks were discussed with the patient.                            All questions were answered, and informed consent                            was obtained. Prior Anticoagulants: The patient has                            taken no previous anticoagulant or antiplatelet                            agents. ASA Grade Assessment: III - A patient with                            severe systemic disease. After reviewing the risks                            and benefits, the patient was deemed in  satisfactory condition to undergo the procedure.                           After obtaining informed consent, the colonoscope                            was passed under direct vision. Throughout the                            procedure, the patient's blood pressure, pulse, and                            oxygen saturations were monitored continuously. The                            Colonoscope was introduced through the  anus and                            advanced to the cecum, identified by appendiceal                            orifice and ileocecal valve. The colonoscopy was                            performed without difficulty. The patient tolerated                            the procedure well. The quality of the bowel                            preparation was good (2 day MiraLax + Suprep). The                            ileocecal valve, appendiceal orifice, and rectum                            were photographed. Scope In: 11:06:39 AM Scope Out: 11:24:43 AM Scope Withdrawal Time: 0 hours 12 minutes 44 seconds  Total Procedure Duration: 0 hours 18 minutes 4 seconds  Findings:                 The digital rectal exam was normal.                           Two sessile polyps were found in the ascending                            colon. The polyps were 5 to 6 mm in size. These                            polyps were removed with a cold snare. Resection                            and retrieval were complete.  Two sessile polyps were found in the transverse                            colon. The polyps were 3 to 4 mm in size. These                            polyps were removed with a cold snare. Resection                            and retrieval were complete.                           A 4 mm polyp was found in the descending colon. The                            polyp was sessile. The polyp was removed with a                            cold snare. Resection and retrieval were complete.                           Multiple small and large-mouthed diverticula were                            found in the sigmoid colon, descending colon and                            hepatic flexure.                           The retroflexed view of the distal rectum and anal                            verge was normal and showed no anal or rectal                             abnormalities. Complications:            No immediate complications. Estimated Blood Loss:     Estimated blood loss was minimal. Impression:               - Two 5 to 6 mm polyps in the ascending colon,                            removed with a cold snare. Resected and retrieved.                           - Two 3 to 4 mm polyps in the transverse colon,                            removed with a cold snare. Resected and retrieved.                           -  One 4 mm polyp in the descending colon, removed                            with a cold snare. Resected and retrieved.                           - Diverticulosis in the sigmoid colon, in the                            descending colon and at the hepatic flexure.                           - The distal rectum and anal verge are normal on                            retroflexion view. Recommendation:           - Patient has a contact number available for                            emergencies. The signs and symptoms of potential                            delayed complications were discussed with the                            patient. Return to normal activities tomorrow.                            Written discharge instructions were provided to the                            patient.                           - Resume previous diet.                           - Continue present medications.                           - Await pathology results.                           - Repeat colonoscopy is recommended for                            surveillance. The colonoscopy date will be                            determined after pathology results from today's                            exam become available for review. Jerene Bears, MD 08/02/2019 11:28:25 AM This report has been signed electronically.

## 2019-08-02 NOTE — Progress Notes (Signed)
A and O x3. Report to RN. Tolerated MAC anesthesia well.

## 2019-08-02 NOTE — Patient Instructions (Signed)
Handouts given for polyps, high fiber diet and diverticulosis.  YOU HAD AN ENDOSCOPIC PROCEDURE TODAY AT [facility]:   Refer to the procedure report that was given to you for any specific questions about what was found during the examination.  If the procedure report does not answer your questions, please call your gastroenterologist to clarify.  If you requested that your care partner not be given the details of your procedure findings, then the procedure report has been included in a sealed envelope for you to review at your convenience later.  YOU SHOULD EXPECT: Some feelings of bloating in the abdomen. Passage of more gas than usual.  Walking can help get rid of the air that was put into your GI tract during the procedure and reduce the bloating. If you had a lower endoscopy (such as a colonoscopy or flexible sigmoidoscopy) you may notice spotting of blood in your stool or on the toilet paper. If you underwent a bowel prep for your procedure, you may not have a normal bowel movement for a few days.  Please Note:  You might notice some irritation and congestion in your nose or some drainage.  This is from the oxygen used during your procedure.  There is no need for concern and it should clear up in a day or so.  SYMPTOMS TO REPORT IMMEDIATELY:   Following lower endoscopy (colonoscopy or flexible sigmoidoscopy):  Excessive amounts of blood in the stool  Significant tenderness or worsening of abdominal pains  Swelling of the abdomen that is new, acute  Fever of 100F or higher  For urgent or emergent issues, a gastroenterologist can be reached at any hour by calling [phone].   DIET:  We do recommend a small meal at first, but then you may proceed to your regular diet.  Drink plenty of fluids but you should avoid alcoholic beverages for 24 hours.  ACTIVITY:  You should plan to take it easy for the rest of today and you should NOT DRIVE or use heavy machinery until  tomorrow (because of the sedation medicines used during the test).    FOLLOW UP: Our staff will call the number listed on your records 48-72 hours following your procedure to check on you and address any questions or concerns that you may have regarding the information given to you following your procedure. If we do not reach you, we will leave a message.  We will attempt to reach you two times.  During this call, we will ask if you have developed any symptoms of COVID 19. If you develop any symptoms (ie: fever, flu-like symptoms, shortness of breath, cough etc.) before then, please call [phone].  If you test positive for Covid 19 in the 2 weeks post procedure, please call and report this information to [place].    If any biopsies were taken you will be contacted by phone or by letter within the next 1-3 weeks.  Please call us at [phone] if you have not heard about the biopsies in 3 weeks.    SIGNATURES/CONFIDENTIALITY: You and/or your care partner have signed paperwork which will be entered into your electronic medical record.  These signatures attest to the fact that that the information above on your After Visit Summary has been reviewed and is understood.  Full responsibility of the confidentiality of this discharge information lies with you and/or your care-partner.

## 2019-08-02 NOTE — Progress Notes (Signed)
Called to room to assist during endoscopic procedure.  Patient ID and intended procedure confirmed with present staff. Received instructions for my participation in the procedure from the performing physician.  

## 2019-08-06 ENCOUNTER — Telehealth: Payer: Self-pay

## 2019-08-06 ENCOUNTER — Other Ambulatory Visit: Payer: Self-pay | Admitting: *Deleted

## 2019-08-06 DIAGNOSIS — F5104 Psychophysiologic insomnia: Secondary | ICD-10-CM

## 2019-08-06 MED ORDER — ALPRAZOLAM 1 MG PO TABS: ORAL_TABLET | ORAL | 5 refills | Status: DC

## 2019-08-06 MED ORDER — ZOLPIDEM TARTRATE 5 MG PO TABS: 5.0000 mg | ORAL_TABLET | Freq: Every day | ORAL | 5 refills | Status: DC

## 2019-08-06 NOTE — Telephone Encounter (Signed)
Pt called back and stated she is going fine

## 2019-08-06 NOTE — Telephone Encounter (Signed)
First attempt follow up call to pt, lm on vm 

## 2019-08-06 NOTE — Telephone Encounter (Signed)
  Follow up Call-  Call back number 08/02/2019  Post procedure Call Back phone  # [phone]  Permission to leave phone message Yes  Some recent data might be hidden     Patient questions:  Do you have a fever, pain , or abdominal swelling? No. Pain Score  0 *  Have you tolerated food without any problems? Yes.    Have you been able to return to your normal activities? Yes.    Do you have any questions about your discharge instructions: Diet   No. Medications  No. Follow up visit  No.  Do you have questions or concerns about your Care? No.  Actions: * If pain score is 4 or above: No action needed, pain <4.  1. Have you developed a fever since your procedure? no  2.   Have you had an respiratory symptoms (SOB or cough) since your procedure? no  3.   Have you tested positive for COVID 19 since your procedure no  4.   Have you had any family members/close contacts diagnosed with the COVID 19 since your procedure?  no   If yes to any of these questions please route to Joylene John, RN and Alphonsa Gin, Therapist, sports.

## 2019-08-06 NOTE — Telephone Encounter (Signed)
[facility] Verified LR: 07/07/2019  Wants the 5 refill as had before. Written on 5/4 and LR 10/4

## 2019-08-06 NOTE — Telephone Encounter (Signed)
Received Request [facility] [place] Database Verified LR: 07/07/2019 Requesting 5 refills last Rx was written 02/04/19 and LR 10/4 Pended Rx and sent to Dr. Mariea Clonts for approval.

## 2019-08-07 ENCOUNTER — Encounter: Payer: Self-pay | Admitting: Internal Medicine

## 2019-08-08 ENCOUNTER — Other Ambulatory Visit: Payer: Self-pay | Admitting: *Deleted

## 2019-08-08 NOTE — Telephone Encounter (Signed)
[facility] [address] does not have Alprazolam it is on back order. Needs Rx Dr. Mariea Clonts approved on 08/05/19 to be sent to [facility] instead.   Pended Rx and sent to [facility] for approval.

## 2019-08-09 MED ORDER — ALPRAZOLAM 1 MG PO TABS: ORAL_TABLET | ORAL | 0 refills | Status: DC

## 2019-09-10 ENCOUNTER — Other Ambulatory Visit: Payer: Self-pay | Admitting: *Deleted

## 2019-09-10 DIAGNOSIS — F419 Anxiety disorder, unspecified: Secondary | ICD-10-CM

## 2019-09-10 DIAGNOSIS — F329 Major depressive disorder, single episode, unspecified: Secondary | ICD-10-CM

## 2019-09-10 MED ORDER — BUPROPION HCL ER (XL) 300 MG PO TB24: ORAL_TABLET | ORAL | 1 refills | Status: DC

## 2019-09-23 ENCOUNTER — Ambulatory Visit (INDEPENDENT_AMBULATORY_CARE_PROVIDER_SITE_OTHER): Payer: Medicare Other | Admitting: Internal Medicine

## 2019-09-23 ENCOUNTER — Other Ambulatory Visit: Payer: Self-pay

## 2019-09-23 ENCOUNTER — Encounter: Payer: Self-pay | Admitting: Internal Medicine

## 2019-09-23 VITALS — BP 128/80 | HR 87 | Temp 98.1°F | Ht <= 58 in | Wt 149.0 lb

## 2019-09-23 DIAGNOSIS — R7303 Prediabetes: Secondary | ICD-10-CM | POA: Diagnosis not present

## 2019-09-23 DIAGNOSIS — F419 Anxiety disorder, unspecified: Secondary | ICD-10-CM

## 2019-09-23 DIAGNOSIS — Z23 Encounter for immunization: Secondary | ICD-10-CM

## 2019-09-23 DIAGNOSIS — F329 Major depressive disorder, single episode, unspecified: Secondary | ICD-10-CM

## 2019-09-23 DIAGNOSIS — E039 Hypothyroidism, unspecified: Secondary | ICD-10-CM

## 2019-09-23 DIAGNOSIS — E782 Mixed hyperlipidemia: Secondary | ICD-10-CM | POA: Diagnosis not present

## 2019-09-23 DIAGNOSIS — I10 Essential (primary) hypertension: Secondary | ICD-10-CM | POA: Diagnosis not present

## 2019-09-23 DIAGNOSIS — F32A Depression, unspecified: Secondary | ICD-10-CM

## 2019-09-23 MED ORDER — BOOSTRIX 5-2.5-18.5 LF-MCG/0.5 IM SUSP: 0.5000 mL | Freq: Once | INTRAMUSCULAR | 0 refills | Status: AC

## 2019-09-23 NOTE — Progress Notes (Signed)
Location:  [facility] clinic Provider:  Malkie Wille L. Mariea Clonts, D.O., C.M.D.  Goals of Care:  Advanced Directives 08/02/2019  Does Patient Have a Medical Advance Directive? No  Type of Advance Directive -  Does patient want to make changes to medical advance directive? -  Copy of [place] in Chart? -  Would patient like information on creating a medical advance directive? No - Patient declined     Chief Complaint  Patient presents with  . Medical Management of Chronic Issues    19mth follow-up    HPI: Patient is a 73 y.o. female seen today for medical management of chronic diseases.    The whole covid isolation is starting to get to her.  Three things all going on at once all stressful.  She feels like everything she has to call she has to wait on hold or count on a call.  She is switching insurance.  She is going to get [facility] now.    She had never had to shop for part D before.  She's gotten it worked out now.  She says I will need to send them a list of meds to mail order.    Her credit card got scammed.  It sounds like she lost it.    Has not had her labs before visit. We'll do all but lipids today.  She's not been eating right.  Her appetite is only for chocolate.  She does not cook, only uses microwave.  She gained weight though.    She's not been to the gym due to covid.  She does not have the gumption to do more at home.    She sleeps on her right side on the edge of the bed.  When she gets up, she pushes up with her right arm.  Her right arm was feeling weak.  It was hard to sit up.  Her legs felt weak, too, when she went to stand up.  She says she was walking with little steps out to the kitchen.  This am, when she got sitting up, she was going to put her feet down, and she fell backwards.  She's fine now today.  She is a little off balance vs her norm.  Has happened occasionally for a long time.  No numbness or tingling in feet.    I put the information for her  medication refills beyond Jan as medication list comments.  Past Medical History:  Diagnosis Date  . Anxiety   . Cataract    BILATERAL REMOVAL  . Chronic kidney disease    KIDNEY STONES 2 TIMES IN 20 YEARS   . Colon polyp    Tubular Adenoma  . Colon polyp, hyperplastic   . DDD (degenerative disc disease), lumbar   . Diverticulosis   . Flushing   . Hyperlipidemia   . Hypertension   . Hypertonicity of bladder   . Insomnia, unspecified   . Internal hemorrhoid   . Other abnormal blood chemistry   . Sigmoid polyp   . Thyroid disease     Past Surgical History:  Procedure Laterality Date  . [place]   has bilateral silicone implants  . CHOLECYSTECTOMY    . COLONOSCOPY  11/24/2015   Dr. Hilarie Fredrickson  . [place]  . POLYPECTOMY    . ROTATOR CUFF REPAIR  2013    No Known Allergies  Outpatient Encounter Medications as of 09/23/2019  Medication Sig  .  ALPRAZolam (XANAX) 1 MG tablet Take two tablets by mouth every night at bedtime as needed for anxiety  . aspirin EC 81 MG tablet Take 1 tablet (81 mg total) by mouth daily.  Marland Kitchen buPROPion (WELLBUTRIN XL) 300 MG 24 hr tablet TAKE 1 TABLET(300 MG) BY MOUTH DAILY  . diclofenac sodium (VOLTAREN) 1 % GEL Apply 2 g topically 4 (four) times daily. To right shoulder for osteoarthritis  . estradiol (ESTRACE) 1 MG tablet Take 1 tablet by mouth daily.  . fexofenadine (ALLEGRA ALLERGY) 180 MG tablet Take 1 tablet (180 mg total) by mouth daily.  Marland Kitchen ketoconazole (NIZORAL) 2 % cream  APPLY ONE APPLICATION TOPICALLY DAILY  . ketoconazole (NIZORAL) 2 % shampoo  APPLY ONE APPLICATION TOPICALLY TWO TIMES A WEEK  . levothyroxine (SYNTHROID) 88 MCG tablet TAKE 1 TABLET(88 MCG) BY MOUTH DAILY  . losartan (COZAAR) 25 MG tablet Take 1 tablet (25 mg total) by mouth daily.  . medroxyPROGESTERone (PROVERA) 2.5 MG tablet Take 2.5 mg by mouth daily.  . polyethylene glycol powder (GLYCOLAX/MIRALAX) powder TAKE ONE CAPFUL BY  MOUTH TWICE DAILY AS NEEDED  . simvastatin (ZOCOR) 40 MG tablet TAKE 1 TABLET BY MOUTH EVERY DAY FOR CHOLESTEROL  . tiZANidine (ZANAFLEX) 2 MG tablet Take one tablet by mouth daily as needed for muscle spasms.  Marland Kitchen zolpidem (AMBIEN) 5 MG tablet Take 1 tablet (5 mg total) by mouth at bedtime.   No facility-administered encounter medications on file as of 09/23/2019.    Review of Systems:  Review of Systems  Constitutional: Negative for chills, fever and malaise/fatigue.  HENT: Negative for congestion, hearing loss and sore throat.   Eyes: Negative for blurred vision.  Respiratory: Negative for cough and shortness of breath.   Cardiovascular: Negative for chest pain, palpitations and leg swelling.  Gastrointestinal: Negative for abdominal pain, blood in stool, constipation, diarrhea and melena.  Genitourinary: Negative for dysuria.  Musculoskeletal: Negative for falls and joint pain.  Skin: Positive for itching and rash.       psoriasis  Neurological: Negative for tingling, sensory change and loss of consciousness.  Endo/Heme/Allergies: Does not bruise/bleed easily.  Psychiatric/Behavioral: Positive for depression. Negative for memory loss. The patient is nervous/anxious and has insomnia.     Health Maintenance  Topic Date Due  . TETANUS/TDAP  10/03/2018  . COLONOSCOPY  08/01/2022  . INFLUENZA VACCINE  Completed  . DEXA SCAN  Completed  . Hepatitis C Screening  Completed  . PNA vac Low Risk Adult  Completed    Physical Exam: Vitals:   09/23/19 1520  BP: 128/80  Pulse: 87  Temp: 98.1 F (36.7 C)  TempSrc: Oral  SpO2: 97%  Weight: 149 lb (67.6 kg)  Height: 4\' 9"  (1.448 m)   Body mass index is 32.24 kg/m. Physical Exam Constitutional:      General: She is not in acute distress.    Appearance: Normal appearance. She is not ill-appearing or toxic-appearing.  HENT:     Head: Normocephalic and atraumatic.  Cardiovascular:     Rate and Rhythm: Normal rate and regular  rhythm.     Pulses: Normal pulses.     Heart sounds: Normal heart sounds.  Pulmonary:     Effort: Pulmonary effort is normal.     Breath sounds: Normal breath sounds. No wheezing, rhonchi or rales.  Abdominal:     General: Bowel sounds are normal.  Musculoskeletal:        General: No swelling or deformity. Normal range of motion.  Right lower leg: No edema.     Left lower leg: No edema.  Neurological:     General: No focal deficit present.     Mental Status: She is alert and oriented to person, place, and time.  Psychiatric:        Mood and Affect: Mood normal.     Labs reviewed: Basic Metabolic Panel: Recent Labs    10/18/18 1343 05/09/19 1353  NA 141  --   K 4.0  --   CL 106  --   CO2 27  --   GLUCOSE 118*  --   BUN 17  --   CREATININE 0.75  --   CALCIUM 9.2  --   TSH 7.42* 0.72   Liver Function Tests: Recent Labs    10/18/18 1343  AST 16  ALT 13  BILITOT 0.5  PROT 7.6   No results for input(s): LIPASE, AMYLASE in the last 8760 hours. No results for input(s): AMMONIA in the last 8760 hours. CBC: Recent Labs    10/18/18 1343  WBC 7.1  NEUTROABS 4,047  HGB 13.2  HCT 39.3  MCV 91.2  PLT 263   Lipid Panel: Recent Labs    10/18/18 1343 05/09/19 1353  CHOL 180 167  HDL 48* 48*  LDLCALC 107* 96  TRIG 140 126  CHOLHDL 3.8 3.5   Lab Results  Component Value Date   HGBA1C 5.9 (H) 05/09/2019     Assessment/Plan 1. Prediabetes - to get her labs that were ordered for before visit today - get back on track with exercise - 31moFLP; Future - 58mohba1c; Future - 74moBMPGFR; Future - COMPLETE METABOLIC PANEL WITH GFR - CBC with Differential/Platelet - Hemoglobin A1c  2. Essential hypertension, benign -bp at goal with current regimen--cont same and monitor - 2moBMPGFR; Future - COMPLETE METABOLIC PANEL WITH GFR - CBC with Differential/Platelet  3. Hypothyroidism, unspecified type -continue current levothyroxine and monitor  4. Mixed  hyperlipidemia - counseled on healthy low fat and low sugar diet and exercise regimen - 33moFLP; Future - 53moBMPGFR; Future  5. Anxiety and depression -cont xanax, wellbutrin  6. Need for Tdap vaccination - Tdap ([place]) 5-2.5-18.5 LF-MCG/0.5 injection; Inject 0.5 mLs into the muscle once for 1 dose.  Dispense: 0.5 mL; Refill: 0  Labs/tests ordered:   Lab Orders     32moFLP     22mohba1c     72moBMPGFR  Next appt:  4 mos med mgt, fasting labs before  Katiya Fike L. Catrina Fellenz, D.O. [facility] [address], [place] [address] Cell Phone (Mon-Fri 8am-5pm):  [phone] On Call:  [phone] & follow prompts after 5pm & weekends Office Phone:  [phone] Office Fax:  [phone]

## 2019-09-24 ENCOUNTER — Encounter: Payer: Self-pay | Admitting: *Deleted

## 2019-09-24 LAB — CBC WITH DIFFERENTIAL/PLATELET
Absolute Monocytes: 510 cells/uL (ref 200–950)
Basophils Absolute: 41 cells/uL (ref 0–200)
Basophils Relative: 0.6 %
Eosinophils Absolute: 41 cells/uL (ref 15–500)
Eosinophils Relative: 0.6 %
HCT: 40.2 % (ref 35.0–45.0)
Hemoglobin: 13.7 g/dL (ref 11.7–15.5)
Lymphs Abs: 1904 cells/uL (ref 850–3900)
MCH: 31.6 pg (ref 27.0–33.0)
MCHC: 34.1 g/dL (ref 32.0–36.0)
MCV: 92.6 fL (ref 80.0–100.0)
MPV: 9.2 fL (ref 7.5–12.5)
Monocytes Relative: 7.5 %
Neutro Abs: 4304 cells/uL (ref 1500–7800)
Neutrophils Relative %: 63.3 %
Platelets: 267 10*3/uL (ref 140–400)
RBC: 4.34 10*6/uL (ref 3.80–5.10)
RDW: 13 % (ref 11.0–15.0)
Total Lymphocyte: 28 %
WBC: 6.8 10*3/uL (ref 3.8–10.8)

## 2019-09-24 LAB — HEMOGLOBIN A1C
Hgb A1c MFr Bld: 6 % of total Hgb — ABNORMAL HIGH (ref <5.7)
Mean Plasma Glucose: 126 (calc)
eAG (mmol/L): 7 (calc)

## 2019-09-24 LAB — COMPLETE METABOLIC PANEL WITH GFR
AG Ratio: 1.1 (calc) (ref 1.0–2.5)
ALT: 10 U/L (ref 6–29)
AST: 13 U/L (ref 10–35)
Albumin: 3.9 g/dL (ref 3.6–5.1)
Alkaline phosphatase (APISO): 58 U/L (ref 37–153)
BUN: 10 mg/dL (ref 7–25)
CO2: 23 mmol/L (ref 20–32)
Calcium: 9.8 mg/dL (ref 8.6–10.4)
Chloride: 105 mmol/L (ref 98–110)
Creat: 0.79 mg/dL (ref 0.60–0.93)
GFR, Est African American: 85 mL/min/{1.73_m2} (ref >=60)
GFR, Est Non African American: 73 mL/min/{1.73_m2} (ref >=60)
Globulin: 3.6 g/dL (calc) (ref 1.9–3.7)
Glucose, Bld: 117 mg/dL (ref 65–139)
Potassium: 4 mmol/L (ref 3.5–5.3)
Sodium: 139 mmol/L (ref 135–146)
Total Bilirubin: 0.3 mg/dL (ref 0.2–1.2)
Total Protein: 7.5 g/dL (ref 6.1–8.1)

## 2019-10-07 ENCOUNTER — Other Ambulatory Visit: Payer: Self-pay | Admitting: *Deleted

## 2019-10-07 DIAGNOSIS — F5104 Psychophysiologic insomnia: Secondary | ICD-10-CM

## 2019-10-07 DIAGNOSIS — F419 Anxiety disorder, unspecified: Secondary | ICD-10-CM

## 2019-10-07 DIAGNOSIS — F329 Major depressive disorder, single episode, unspecified: Secondary | ICD-10-CM

## 2019-10-07 DIAGNOSIS — J301 Allergic rhinitis due to pollen: Secondary | ICD-10-CM

## 2019-10-07 MED ORDER — BUPROPION HCL ER (XL) 300 MG PO TB24: ORAL_TABLET | ORAL | 1 refills | Status: DC

## 2019-10-07 MED ORDER — LEVOTHYROXINE SODIUM 88 MCG PO TABS: ORAL_TABLET | ORAL | 1 refills | Status: DC

## 2019-10-07 MED ORDER — ZOLPIDEM TARTRATE 5 MG PO TABS: 5.0000 mg | ORAL_TABLET | Freq: Every day | ORAL | 0 refills | Status: DC

## 2019-10-07 MED ORDER — MEDROXYPROGESTERONE ACETATE 2.5 MG PO TABS: 2.5000 mg | ORAL_TABLET | Freq: Every day | ORAL | 1 refills | Status: DC

## 2019-10-07 MED ORDER — SIMVASTATIN 40 MG PO TABS: ORAL_TABLET | ORAL | 1 refills | Status: DC

## 2019-10-07 MED ORDER — LOSARTAN POTASSIUM 25 MG PO TABS: 25.0000 mg | ORAL_TABLET | Freq: Every day | ORAL | 1 refills | Status: DC

## 2019-10-07 MED ORDER — ALPRAZOLAM 1 MG PO TABS: ORAL_TABLET | ORAL | 0 refills | Status: DC

## 2019-10-07 MED ORDER — ESTRADIOL 1 MG PO TABS: 1.0000 mg | ORAL_TABLET | Freq: Every day | ORAL | 1 refills | Status: DC

## 2019-10-07 MED ORDER — FEXOFENADINE HCL 180 MG PO TABS: 180.0000 mg | ORAL_TABLET | Freq: Every day | ORAL | 1 refills | Status: DC

## 2019-10-07 MED ORDER — POLYETHYLENE GLYCOL 3350 17 GM/SCOOP PO POWD: ORAL | 1 refills | Status: DC

## 2019-10-07 NOTE — Telephone Encounter (Signed)
Can we mail her a non-opioid controlled substance agreement to sign?  I realized we neglected to do that at her last visit.

## 2019-10-07 NOTE — Telephone Encounter (Signed)
Patient called and stated that she has new insurance and needs her Rx's faxed to the new [facility].  Pended Rx's and sent to Dr. Mariea Clonts for approval.

## 2019-10-14 ENCOUNTER — Other Ambulatory Visit: Payer: Self-pay | Admitting: *Deleted

## 2019-10-14 MED ORDER — TIZANIDINE HCL 2 MG PO TABS: ORAL_TABLET | ORAL | 0 refills | Status: DC

## 2019-10-14 NOTE — Telephone Encounter (Signed)
Last filled 12/2017 for only 15, please advise??

## 2019-10-28 ENCOUNTER — Other Ambulatory Visit: Payer: Self-pay | Admitting: *Deleted

## 2019-10-28 MED ORDER — TIZANIDINE HCL 2 MG PO TABS: ORAL_TABLET | ORAL | 0 refills | Status: DC

## 2019-10-28 NOTE — Telephone Encounter (Signed)
Patient requested refill °Pended Rx and sent to Dr. Reed for approval.  °

## 2019-11-28 ENCOUNTER — Emergency Department (HOSPITAL_COMMUNITY)
Admission: EM | Admit: 2019-11-28 | Discharge: 2019-11-28 | Disposition: A | Discharge status: Home / Self Care | Payer: Medicare Other | Attending: Emergency Medicine | Admitting: Emergency Medicine

## 2019-11-28 ENCOUNTER — Emergency Department (HOSPITAL_COMMUNITY): Payer: Medicare Other

## 2019-11-28 ENCOUNTER — Encounter (HOSPITAL_COMMUNITY): Payer: Self-pay

## 2019-11-28 ENCOUNTER — Other Ambulatory Visit: Payer: Self-pay

## 2019-11-28 DIAGNOSIS — E039 Hypothyroidism, unspecified: Secondary | ICD-10-CM | POA: Diagnosis not present

## 2019-11-28 DIAGNOSIS — Z87891 Personal history of nicotine dependence: Secondary | ICD-10-CM | POA: Insufficient documentation

## 2019-11-28 DIAGNOSIS — I129 Hypertensive chronic kidney disease with stage 1 through stage 4 chronic kidney disease, or unspecified chronic kidney disease: Secondary | ICD-10-CM | POA: Diagnosis not present

## 2019-11-28 DIAGNOSIS — N189 Chronic kidney disease, unspecified: Secondary | ICD-10-CM | POA: Insufficient documentation

## 2019-11-28 DIAGNOSIS — Z8673 Personal history of transient ischemic attack (TIA), and cerebral infarction without residual deficits: Secondary | ICD-10-CM | POA: Insufficient documentation

## 2019-11-28 DIAGNOSIS — N200 Calculus of kidney: Secondary | ICD-10-CM | POA: Insufficient documentation

## 2019-11-28 DIAGNOSIS — Z7982 Long term (current) use of aspirin: Secondary | ICD-10-CM | POA: Insufficient documentation

## 2019-11-28 DIAGNOSIS — R52 Pain, unspecified: Secondary | ICD-10-CM | POA: Diagnosis not present

## 2019-11-28 DIAGNOSIS — Z79899 Other long term (current) drug therapy: Secondary | ICD-10-CM | POA: Insufficient documentation

## 2019-11-28 DIAGNOSIS — R339 Retention of urine, unspecified: Secondary | ICD-10-CM | POA: Diagnosis present

## 2019-11-28 DIAGNOSIS — R110 Nausea: Secondary | ICD-10-CM | POA: Diagnosis not present

## 2019-11-28 DIAGNOSIS — R1110 Vomiting, unspecified: Secondary | ICD-10-CM | POA: Diagnosis not present

## 2019-11-28 LAB — COMPREHENSIVE METABOLIC PANEL
ALT: 15 U/L (ref 0–44)
AST: 17 U/L (ref 15–41)
Albumin: 3.8 g/dL (ref 3.5–5.0)
Alkaline Phosphatase: 53 U/L (ref 38–126)
Anion gap: 10 (ref 5–15)
BUN: 20 mg/dL (ref 8–23)
CO2: 23 mmol/L (ref 22–32)
Calcium: 9.8 mg/dL (ref 8.9–10.3)
Chloride: 98 mmol/L (ref 98–111)
Creatinine, Ser: 1.04 mg/dL — ABNORMAL HIGH (ref 0.44–1.00)
GFR calc Af Amer: >60 mL/min (ref >60)
GFR calc non Af Amer: 52 mL/min — ABNORMAL LOW (ref >60)
Glucose, Bld: 170 mg/dL — ABNORMAL HIGH (ref 70–99)
Potassium: 4.2 mmol/L (ref 3.5–5.1)
Sodium: 131 mmol/L — ABNORMAL LOW (ref 135–145)
Total Bilirubin: 0.4 mg/dL (ref 0.3–1.2)
Total Protein: 7.5 g/dL (ref 6.5–8.1)

## 2019-11-28 LAB — CBC WITH DIFFERENTIAL/PLATELET
Abs Immature Granulocytes: 0.06 10*3/uL (ref 0.00–0.07)
Basophils Absolute: 0 10*3/uL (ref 0.0–0.1)
Basophils Relative: 0 %
Eosinophils Absolute: 0 10*3/uL (ref 0.0–0.5)
Eosinophils Relative: 0 %
HCT: 37.5 % (ref 36.0–46.0)
Hemoglobin: 13.1 g/dL (ref 12.0–15.0)
Immature Granulocytes: 1 %
Lymphocytes Relative: 13 %
Lymphs Abs: 1.5 10*3/uL (ref 0.7–4.0)
MCH: 32.3 pg (ref 26.0–34.0)
MCHC: 34.9 g/dL (ref 30.0–36.0)
MCV: 92.6 fL (ref 80.0–100.0)
Monocytes Absolute: 0.7 10*3/uL (ref 0.1–1.0)
Monocytes Relative: 6 %
Neutro Abs: 9.2 10*3/uL — ABNORMAL HIGH (ref 1.7–7.7)
Neutrophils Relative %: 80 %
Platelets: 219 10*3/uL (ref 150–400)
RBC: 4.05 MIL/uL (ref 3.87–5.11)
RDW: 13.5 % (ref 11.5–15.5)
WBC: 11.5 10*3/uL — ABNORMAL HIGH (ref 4.0–10.5)
nRBC: 0 % (ref 0.0–0.2)

## 2019-11-28 LAB — URINALYSIS, ROUTINE W REFLEX MICROSCOPIC
Bacteria, UA: NONE SEEN
Bilirubin Urine: NEGATIVE
Glucose, UA: 50 mg/dL — AB
Hgb urine dipstick: NEGATIVE
Ketones, ur: NEGATIVE mg/dL
Leukocytes,Ua: NEGATIVE
Nitrite: NEGATIVE
Protein, ur: NEGATIVE mg/dL
Specific Gravity, Urine: 1.01 (ref 1.005–1.030)
pH: 7 (ref 5.0–8.0)

## 2019-11-28 LAB — LIPASE, BLOOD: Lipase: 32 U/L (ref 11–51)

## 2019-11-28 MED ORDER — IOHEXOL 300 MG/ML  SOLN
100.0000 mL | Freq: Once | INTRAMUSCULAR | Status: AC | PRN
  Administered 2019-11-28: 19:00:00 100 mL via INTRAVENOUS

## 2019-11-28 MED ORDER — KETOROLAC TROMETHAMINE 15 MG/ML IJ SOLN
15.0000 mg | Freq: Once | INTRAMUSCULAR | Status: AC
  Administered 2019-11-28: 15 mg via INTRAVENOUS
  Filled 2019-11-28: qty 1

## 2019-11-28 MED ORDER — FENTANYL CITRATE (PF) 100 MCG/2ML IJ SOLN
50.0000 ug | Freq: Once | INTRAMUSCULAR | Status: AC
  Administered 2019-11-28: 19:00:00 50 ug via INTRAVENOUS
  Filled 2019-11-28: qty 2

## 2019-11-28 MED ORDER — FENTANYL CITRATE (PF) 100 MCG/2ML IJ SOLN
50.0000 ug | Freq: Once | INTRAMUSCULAR | Status: AC
  Administered 2019-11-28: 50 ug via INTRAVENOUS
  Filled 2019-11-28: qty 2

## 2019-11-28 MED ORDER — HYDROMORPHONE HCL 1 MG/ML IJ SOLN
0.5000 mg | Freq: Once | INTRAMUSCULAR | Status: AC
  Administered 2019-11-28: 0.5 mg via INTRAVENOUS
  Filled 2019-11-28: qty 1

## 2019-11-28 MED ORDER — ONDANSETRON HCL 4 MG/2ML IJ SOLN
4.0000 mg | Freq: Once | INTRAMUSCULAR | Status: AC
  Administered 2019-11-28: 4 mg via INTRAVENOUS
  Filled 2019-11-28: qty 2

## 2019-11-28 MED ORDER — SODIUM CHLORIDE (PF) 0.9 % IJ SOLN
INTRAMUSCULAR | Status: AC
  Filled 2019-11-28: qty 50

## 2019-11-28 MED ORDER — ONDANSETRON 4 MG PO TBDP: 4.0000 mg | ORAL_TABLET | Freq: Three times a day (TID) | ORAL | 0 refills | Status: DC | PRN

## 2019-11-28 MED ORDER — KETOROLAC TROMETHAMINE 10 MG PO TABS: 10.0000 mg | ORAL_TABLET | Freq: Four times a day (QID) | ORAL | 0 refills | Status: DC | PRN

## 2019-11-28 NOTE — ED Provider Notes (Signed)
[facility] DEPT Provider Note   CSN: [id number] Arrival date & time: 11/28/19  1734     History Chief Complaint  Patient presents with  . Urinary Retention    Kelli [name] is a 74 y.o. female.  Presents to ER with sensation of not being able to void.  Last appointment around noon.  No change in bowel habits, no bladder or bowel incontinence.  No saddle anesthesia, no numbness or weakness.  Has had associated nausea and occasional vomiting.  No blood in vomit.  HPI     Past Medical History:  Diagnosis Date  . Anxiety   . Cataract    BILATERAL REMOVAL  . Chronic kidney disease    KIDNEY STONES 2 TIMES IN 20 YEARS   . Colon polyp    Tubular Adenoma  . Colon polyp, hyperplastic   . DDD (degenerative disc disease), lumbar   . Diverticulosis   . Flushing   . Hyperlipidemia   . Hypertension   . Hypertonicity of bladder   . Insomnia, unspecified   . Internal hemorrhoid   . Other abnormal blood chemistry   . Sigmoid polyp   . Thyroid disease     Patient Active Problem List   Diagnosis Date Noted  . Osteoporosis 05/19/2017  . Facial droop 05/15/2014  . TIA (transient ischemic attack) 05/15/2014  . Mixed hyperlipidemia 12/26/2013  . Essential hypertension, benign 12/26/2013  . Hypothyroidism 05/27/2013  . Seasonal allergies 05/27/2013  . Anxiety and depression 05/27/2013  . Prediabetes 05/27/2013  . Hyperlipidemia   . DYSPNEA 09/19/2007    Past Surgical History:  Procedure Laterality Date  . [place]   has bilateral silicone implants  . CHOLECYSTECTOMY    . COLONOSCOPY  11/24/2015   Dr. Hilarie Fredrickson  . [place]  . POLYPECTOMY    . ROTATOR CUFF REPAIR  2013     OB History   No obstetric history on file.     Family History  Problem Relation Age of Onset  . Cancer Mother        lung  . Colon polyps Mother   . Stroke Father   . Colon cancer Neg Hx   . Rectal cancer Neg Hx     . Stomach cancer Neg Hx   . Esophageal cancer Neg Hx     Social History   Tobacco Use  . Smoking status: Former Smoker    Quit date: 11/30/1995    Years since quitting: 24.0  . Smokeless tobacco: Never Used  Substance Use Topics  . Alcohol use: No    Alcohol/week: 0.0 standard drinks  . Drug use: No    Home Medications Prior to Admission medications   Medication Sig Start Date End Date Taking? Authorizing Provider  ALPRAZolam Duanne Moron) 1 MG tablet Take two tablets by mouth every night at bedtime as needed for anxiety 10/07/19   Mariea Clonts, Tiffany L, DO  aspirin EC 81 MG tablet Take 1 tablet (81 mg total) by mouth daily. 01/07/16   Reed, Tiffany L, DO  buPROPion (WELLBUTRIN XL) 300 MG 24 hr tablet TAKE 1 TABLET(300 MG) BY MOUTH DAILY 10/07/19   Reed, Tiffany L, DO  diclofenac sodium (VOLTAREN) 1 % GEL Apply 2 g topically 4 (four) times daily. To right shoulder for osteoarthritis 12/21/17   Reed, Tiffany L, DO  estradiol (ESTRACE) 1 MG tablet Take 1 tablet (1 mg total) by mouth daily. 10/07/19   Reed, Tiffany L, DO  fexofenadine (ALLEGRA ALLERGY) 180 MG tablet Take 1 tablet (180 mg total) by mouth daily. 10/07/19   Reed, Tiffany L, DO  ketoconazole (NIZORAL) 2 % cream  APPLY ONE APPLICATION TOPICALLY DAILY 05/29/18   Reed, Tiffany L, DO  ketoconazole (NIZORAL) 2 % shampoo  APPLY ONE APPLICATION TOPICALLY TWO TIMES A WEEK 07/20/18   Reed, Tiffany L, DO  levothyroxine (SYNTHROID) 88 MCG tablet Take one tablet by mouth once daily 30 minutes before breakfast for thyroid 10/07/19   Reed, Tiffany L, DO  losartan (COZAAR) 25 MG tablet Take 1 tablet (25 mg total) by mouth daily. 10/07/19   Reed, Tiffany L, DO  medroxyPROGESTERone (PROVERA) 2.5 MG tablet Take 1 tablet (2.5 mg total) by mouth daily. 10/07/19   Reed, Tiffany L, DO  polyethylene glycol powder (GLYCOLAX/MIRALAX) 17 GM/SCOOP powder Take one capful by mouth twice daily as needed 10/07/19   Reed, Tiffany L, DO  simvastatin (ZOCOR) 40 MG tablet Take one tablet  by mouth once daily for cholesterol. 10/07/19   Reed, Tiffany L, DO  tiZANidine (ZANAFLEX) 2 MG tablet Take one tablet by mouth daily as needed for muscle spasms. 10/28/19   Reed, Tiffany L, DO  zolpidem (AMBIEN) 5 MG tablet Take 1 tablet (5 mg total) by mouth at bedtime. 10/07/19   Reed, Tiffany L, DO    Allergies    Patient has no known allergies.  Review of Systems   Review of Systems  Constitutional: Negative for chills and fever.  HENT: Negative for ear pain and sore throat.   Eyes: Negative for pain and visual disturbance.  Respiratory: Negative for cough and shortness of breath.   Cardiovascular: Negative for chest pain and palpitations.  Gastrointestinal: Negative for abdominal pain and vomiting.  Genitourinary: Positive for decreased urine volume, difficulty urinating and dysuria. Negative for hematuria.  Musculoskeletal: Negative for arthralgias and back pain.  Skin: Negative for color change and rash.  Neurological: Negative for seizures and syncope.  All other systems reviewed and are negative.   Physical Exam Updated Vital Signs BP 128/61   Pulse 67   Temp 98.8 F (37.1 C) (Oral)   Resp 16   Ht 4\' 11"  (1.499 m)   Wt 63.5 kg   SpO2 97%   BMI 28.28 kg/m   Physical Exam Vitals and nursing note reviewed.  Constitutional:      General: She is not in acute distress.    Appearance: She is well-developed.  HENT:     Head: Normocephalic and atraumatic.  Eyes:     Conjunctiva/sclera: Conjunctivae normal.  Cardiovascular:     Rate and Rhythm: Normal rate and regular rhythm.     Heart sounds: No murmur.  Pulmonary:     Effort: Pulmonary effort is normal. No respiratory distress.     Breath sounds: Normal breath sounds.  Abdominal:     Palpations: Abdomen is soft.     Tenderness: There is no abdominal tenderness.     Comments: No generalized abd TTP, there is TTP in suprapubic region  Musculoskeletal:        General: No deformity or signs of injury.     Cervical  back: Neck supple.  Skin:    General: Skin is warm and dry.     Capillary Refill: Capillary refill takes less than 2 seconds.  Neurological:     General: No focal deficit present.     Mental Status: She is alert and oriented to person, place, and time.  Psychiatric:  Mood and Affect: Mood normal.        Behavior: Behavior normal.     ED Results / Procedures / Treatments   Labs (all labs ordered are listed, but only abnormal results are displayed) Labs Reviewed  CBC WITH DIFFERENTIAL/PLATELET - Abnormal; Notable for the following components:      Result Value   WBC 11.5 (*)    Neutro Abs 9.2 (*)    All other components within normal limits  COMPREHENSIVE METABOLIC PANEL - Abnormal; Notable for the following components:   Sodium 131 (*)    Glucose, Bld 170 (*)    Creatinine, Ser 1.04 (*)    GFR calc non Af Amer 52 (*)    All other components within normal limits  LIPASE, BLOOD  URINALYSIS, ROUTINE W REFLEX MICROSCOPIC    EKG None  Radiology No results found.  Procedures Procedures (including critical care time)  Medications Ordered in ED Medications  ondansetron (ZOFRAN) injection 4 mg (has no administration in time range)  fentaNYL (SUBLIMAZE) injection 50 mcg (50 mcg Intravenous Given 11/28/19 1833)    ED Course  I have reviewed the triage vital signs and the nursing notes.  Pertinent labs & imaging results that were available during my care of the patient were reviewed by me and considered in my medical decision making (see chart for details).  Clinical Course as of Nov 27 2324  Thu Nov 28, 2019  1836 Completed initial assessment   [RD]  1948 Noted CT results, patient still in pain, will give additional pain meds, await UA, will likely consult urology  CT ABDOMEN PELVIS W CONTRAST [RD]  2323 Rechecked, pain gone, reviewed return precautions, will dc home   [RD]    Clinical Course User Index [RD] Lucrezia Starch, MD   MDM Rules/Calculators/A&P                       74 year old lady presents to ER with concern for nausea, abdominal pain, urinary retention.  On exam patient well-appearing but noted to have Nausea and seems somewhat uncomfortable initially.  She was very concerned that she was having urinary retention but bladder scan did not show this.  Place Foley catheter with modest return. Doubt patient was truly retaining. CT scan demonstrated R 98mm UVJ stone. Discussed with Junious Silk -he believes this is likely source of patient's symptoms including sensation of urinary retention.  Recommends discontinuing Foley, if symptoms controlled then discharged outpatient management.  On reassessment, unable to control symptoms, patient remains well-appearing with stable vital signs.  She is afebrile, no infection on UA.  Suspect the 4 mm stone will resolve spontaneously.  Provided information for outpatient urology follow-up.  Reviewed return precautions in detail with patient, provided Rx for NSAIDs, Zofran.    After the discussed management above, the patient was determined to be safe for discharge.  The patient was in agreement with this plan and all questions regarding their care were answered.  ED return precautions were discussed and the patient will return to the ED with any significant worsening of condition.   Final Clinical Impression(s) / ED Diagnoses Final diagnoses:  Nephrolithiasis    Rx / DC Orders ED Discharge Orders    None       Lucrezia Starch, MD 11/28/19 2344

## 2019-11-28 NOTE — ED Triage Notes (Signed)
Pt BIB [facility] from home due to pt unable to void since 1200 and c/o pressure in her lower abd. Pt did experience 1 episode of vomiting with EMS and had nausea before EMS arrived. Pt states she has hx of kidney stones.    Bladder Scan shows 69, 72, 71ml with in ED.

## 2019-11-28 NOTE — ED Notes (Signed)
Pt transported to CT ?

## 2019-11-28 NOTE — Discharge Instructions (Signed)
Recommend follow-up with urology regarding his kidney stone.  Recommend following up with your primary doctor regarding Covid vaccination.  Please take Tylenol, NSAIDs such as prescribed Toradol as needed for pain control.  Can also take Zofran for nausea.  Return to ER for any fever, urinary retention, worsening abdominal pain or other new concerning symptom.

## 2019-12-02 ENCOUNTER — Telehealth: Payer: Self-pay | Admitting: *Deleted

## 2019-12-02 ENCOUNTER — Ambulatory Visit: Payer: Self-pay | Admitting: Internal Medicine

## 2019-12-02 NOTE — Telephone Encounter (Signed)
Patient called and stated that she has an appointment today with Dr. Mariea Clonts. Stated that she needs to cancel it because she is having Stage manager and contractors are coming out today to fix it. Stated that it is a serious problem and she can't cancel it. Stated that she will call back to reschedule.  Appointment canceled.

## 2019-12-08 ENCOUNTER — Other Ambulatory Visit: Payer: Self-pay | Admitting: Internal Medicine

## 2019-12-13 ENCOUNTER — Other Ambulatory Visit: Payer: Self-pay | Admitting: Internal Medicine

## 2020-01-06 ENCOUNTER — Other Ambulatory Visit: Payer: Self-pay

## 2020-01-06 ENCOUNTER — Other Ambulatory Visit: Payer: Medicare Other

## 2020-01-06 DIAGNOSIS — I10 Essential (primary) hypertension: Secondary | ICD-10-CM | POA: Diagnosis not present

## 2020-01-06 DIAGNOSIS — R7303 Prediabetes: Secondary | ICD-10-CM

## 2020-01-06 DIAGNOSIS — E782 Mixed hyperlipidemia: Secondary | ICD-10-CM

## 2020-01-07 LAB — BASIC METABOLIC PANEL WITH GFR
BUN: 16 mg/dL (ref 7–25)
CO2: 27 mmol/L (ref 20–32)
Calcium: 9.4 mg/dL (ref 8.6–10.4)
Chloride: 103 mmol/L (ref 98–110)
Creat: 0.76 mg/dL (ref 0.60–0.93)
GFR, Est African American: 88 mL/min/{1.73_m2} (ref >=60)
GFR, Est Non African American: 76 mL/min/{1.73_m2} (ref >=60)
Glucose, Bld: 119 mg/dL — ABNORMAL HIGH (ref 65–99)
Potassium: 4.3 mmol/L (ref 3.5–5.3)
Sodium: 137 mmol/L (ref 135–146)

## 2020-01-07 LAB — LIPID PANEL
Cholesterol: 153 mg/dL (ref <200)
HDL: 48 mg/dL — ABNORMAL LOW (ref >=50)
LDL Cholesterol (Calc): 84 mg/dL (calc)
Non-HDL Cholesterol (Calc): 105 mg/dL (calc) (ref <130)
Total CHOL/HDL Ratio: 3.2 (calc) (ref <5.0)
Triglycerides: 110 mg/dL (ref <150)

## 2020-01-07 LAB — HEMOGLOBIN A1C
Hgb A1c MFr Bld: 5.9 % of total Hgb — ABNORMAL HIGH (ref <5.7)
Mean Plasma Glucose: 123 (calc)
eAG (mmol/L): 6.8 (calc)

## 2020-01-07 NOTE — Progress Notes (Signed)
Overall improvement: Fasting sugar was down.  Kidney function was back to normal. Hba1c was down to 5.9 from 6 in dec Bad cholesterol trended down to 84 from 96 (goal is less than 70 though) and triglycerides also went down to 110 from 126.  Good cholesterol remains low.  Helped by exercise, leafy greens, omega 3 fish oil in foods like salmon, nuts.

## 2020-01-09 ENCOUNTER — Ambulatory Visit (INDEPENDENT_AMBULATORY_CARE_PROVIDER_SITE_OTHER): Payer: Medicare Other | Admitting: Internal Medicine

## 2020-01-09 ENCOUNTER — Encounter: Payer: Self-pay | Admitting: Internal Medicine

## 2020-01-09 ENCOUNTER — Other Ambulatory Visit: Payer: Self-pay

## 2020-01-09 VITALS — BP 118/62 | HR 85 | Temp 98.0°F | Ht 59.0 in | Wt 137.6 lb

## 2020-01-09 DIAGNOSIS — L409 Psoriasis, unspecified: Secondary | ICD-10-CM | POA: Diagnosis not present

## 2020-01-09 DIAGNOSIS — M62830 Muscle spasm of back: Secondary | ICD-10-CM

## 2020-01-09 DIAGNOSIS — E782 Mixed hyperlipidemia: Secondary | ICD-10-CM | POA: Diagnosis not present

## 2020-01-09 DIAGNOSIS — L408 Other psoriasis: Secondary | ICD-10-CM

## 2020-01-09 DIAGNOSIS — E039 Hypothyroidism, unspecified: Secondary | ICD-10-CM | POA: Diagnosis not present

## 2020-01-09 DIAGNOSIS — R7303 Prediabetes: Secondary | ICD-10-CM | POA: Diagnosis not present

## 2020-01-09 DIAGNOSIS — Z598 Other problems related to housing and economic circumstances: Secondary | ICD-10-CM

## 2020-01-09 DIAGNOSIS — I10 Essential (primary) hypertension: Secondary | ICD-10-CM

## 2020-01-09 DIAGNOSIS — Z599 Problem related to housing and economic circumstances, unspecified: Secondary | ICD-10-CM

## 2020-01-09 MED ORDER — KETOCONAZOLE 2 % EX SHAM: MEDICATED_SHAMPOO | CUTANEOUS | 1 refills | Status: DC

## 2020-01-09 MED ORDER — TIZANIDINE HCL 4 MG PO CAPS: 4.0000 mg | ORAL_CAPSULE | Freq: Every day | ORAL | 0 refills | Status: DC | PRN

## 2020-01-09 NOTE — Patient Instructions (Signed)
Call [phone] for the [facility] care manager.  They may be able to help you with affording dental care and dealing with your pharmacy issues.

## 2020-01-09 NOTE — Progress Notes (Signed)
Location:  [facility] clinic Provider:  Tameah Mihalko L. Mariea Clonts, D.O., C.M.D.  Goals of Care:  Advanced Directives 01/09/2020  Does Patient Have a Medical Advance Directive? No  Type of Advance Directive -  Does patient want to make changes to medical advance directive? -  Copy of [place] in Chart? -  Would patient like information on creating a medical advance directive? No - Patient declined  encouraged her to get advance directive notarized and provide [place] with a copy for her chart   Chief Complaint  Patient presents with  . Medical Management of Chronic Issues    4 month follow up     HPI: Patient is a 74 y.o. female seen today for medical management of chronic diseases.    She is saying mail order is being a nuisance for her.  It took till feb to get all of the meds and she needs to call them to sort things out.    She is not depressed, she's agitated.  She gets really frustrated when she has to deal with things like the pharmacy.  She says things are all falling apart at her house.  Leak under sink around thanksgiving.  After he said she could use the one side of the sink, but he never called her back.  Her neighbor then came and fixed it.  Last night she went to switch thermostat heat to cool so called guy that installed and he gave her instructions, but it didn't come up right.  She had to call the company and they didn't call back in 3 hrs.  She called back and talked to someone.  One bathroom set of door knobs don't work.    Has four broken off teeth on the bottom.  Shows me a photo b/c she says her mouth is yucky.  Last night, a piece of the broken tooth came loose and rubbing inside lip.  Today it's been fine.  She googled oral surgeons and it scared her due to costs.  She knows she will eventually have to do that.    Had both covid vaccines.  She had tizanidine before for muscle spasms in her back. They were capsules.  Then the next time, they were pills.     Reviewed labs with her:  Fasting sugar improved.  Kidney function normalized.  Bad cholesterol improved.    4/17, she can finally go out to grocery shop.  She is going to go to the gym again after that.  She has been doing some light weights on ankles and dancing around at home.  She doesn't maintain a routine at home.    Past Medical History:  Diagnosis Date  . Anxiety   . Cataract    BILATERAL REMOVAL  . Chronic kidney disease    KIDNEY STONES 2 TIMES IN 20 YEARS   . Colon polyp    Tubular Adenoma  . Colon polyp, hyperplastic   . DDD (degenerative disc disease), lumbar   . Diverticulosis   . [place]   . Hyperlipidemia   . Hypertension   . Hypertonicity of bladder   . Insomnia, unspecified   . Internal hemorrhoid   . Other abnormal blood chemistry   . Sigmoid polyp   . Thyroid disease     Past Surgical History:  Procedure Laterality Date  . [place]   has bilateral silicone implants  . CHOLECYSTECTOMY    . COLONOSCOPY  11/24/2015   Dr. Hilarie Fredrickson  .  [place]  . POLYPECTOMY    . ROTATOR CUFF REPAIR  2013    No Known Allergies  Outpatient Encounter Medications as of 01/09/2020  Medication Sig  . ALPRAZolam (XANAX) 1 MG tablet Take two tablets by mouth every night at bedtime as needed for anxiety  . aspirin EC 81 MG tablet Take 1 tablet (81 mg total) by mouth daily.  Marland Kitchen buPROPion (WELLBUTRIN XL) 300 MG 24 hr tablet TAKE 1 TABLET(300 MG) BY MOUTH DAILY  . diclofenac sodium (VOLTAREN) 1 % GEL Apply 2 g topically 4 (four) times daily. To right shoulder for osteoarthritis  . estradiol (ESTRACE) 1 MG tablet Take 1 tablet (1 mg total) by mouth daily.  . fexofenadine (ALLEGRA ALLERGY) 180 MG tablet Take 1 tablet (180 mg total) by mouth daily.  Marland Kitchen ketorolac (TORADOL) 10 MG tablet Take 1 tablet (10 mg total) by mouth every 6 (six) hours as needed for moderate pain or severe pain.  Marland Kitchen levothyroxine (SYNTHROID) 88 MCG tablet TAKE 1  TABLET(88 MCG) BY MOUTH DAILY.  Marland Kitchen losartan (COZAAR) 25 MG tablet Take 1 tablet (25 mg total) by mouth daily.  . medroxyPROGESTERone (PROVERA) 2.5 MG tablet Take 1 tablet (2.5 mg total) by mouth daily.  . ondansetron (ZOFRAN ODT) 4 MG disintegrating tablet Take 1 tablet (4 mg total) by mouth every 8 (eight) hours as needed for nausea or vomiting.  . polyethylene glycol powder (GLYCOLAX/MIRALAX) 17 GM/SCOOP powder Take one capful by mouth twice daily as needed  . simvastatin (ZOCOR) 40 MG tablet TAKE 1 TABLET BY MOUTH EVERY DAY FOR CHOLESTEROL  . tiZANidine (ZANAFLEX) 2 MG tablet Take one tablet by mouth daily as needed for muscle spasms.  Marland Kitchen zolpidem (AMBIEN) 5 MG tablet Take 1 tablet (5 mg total) by mouth at bedtime.  . [DISCONTINUED] ketoconazole (NIZORAL) 2 % shampoo  APPLY ONE APPLICATION TOPICALLY TWO TIMES A WEEK  . ketoconazole (NIZORAL) 2 % shampoo Apply topically 2 (two) times a week.  . [DISCONTINUED] ketoconazole (NIZORAL) 2 % cream  APPLY ONE APPLICATION TOPICALLY DAILY (Patient not taking: Reported on 11/28/2019)   No facility-administered encounter medications on file as of 01/09/2020.    Review of Systems:  Review of Systems  Constitutional: Positive for malaise/fatigue. Negative for chills and fever.  HENT: Negative for hearing loss.        Issues with teeth and dentures (see hpi)  Eyes: Negative for blurred vision.       Glasses  Respiratory: Negative for cough and shortness of breath.   Cardiovascular: Negative for chest pain, palpitations and leg swelling.  Gastrointestinal: Negative for abdominal pain, blood in stool, constipation, diarrhea and melena.  Genitourinary: Negative for dysuria.  Musculoskeletal: Negative for falls and joint pain.  Skin: Positive for rash. Negative for itching.       psoriasis  Neurological: Negative for dizziness and loss of consciousness.  Endo/Heme/Allergies: Bruises/bleeds easily.  Psychiatric/Behavioral: Positive for depression. Negative  for memory loss. The patient is nervous/anxious and has insomnia.     Health Maintenance  Topic Date Due  . TETANUS/TDAP  10/03/2018  . INFLUENZA VACCINE  05/03/2020  . COLONOSCOPY  08/01/2022  . DEXA SCAN  Completed  . Hepatitis C Screening  Completed  . PNA vac Low Risk Adult  Completed    Physical Exam: Vitals:   01/09/20 1526  BP: 118/62  Pulse: 85  Temp: 98 F (36.7 C)  SpO2: 98%  Weight: 137 lb 9.6 oz (62.4 kg)  Height: 4'  11" (1.499 m)   Body mass index is 27.79 kg/m. Physical Exam Vitals reviewed.  Constitutional:      General: She is not in acute distress.    Appearance: Normal appearance. She is not toxic-appearing.  HENT:     Head: Normocephalic and atraumatic.     Mouth/Throat:     Comments: Several broken off teeth lower gums Eyes:     Extraocular Movements: Extraocular movements intact.     Pupils: Pupils are equal, round, and reactive to light.     Comments: glasses  Cardiovascular:     Rate and Rhythm: Normal rate and regular rhythm.     Pulses: Normal pulses.     Heart sounds: Normal heart sounds.  Pulmonary:     Effort: Pulmonary effort is normal.     Breath sounds: Normal breath sounds. No wheezing, rhonchi or rales.  Abdominal:     General: Bowel sounds are normal. There is no distension.     Palpations: Abdomen is soft.     Tenderness: There is no abdominal tenderness.  Musculoskeletal:        General: Normal range of motion.     Right lower leg: No edema.     Left lower leg: No edema.  Skin:    General: Skin is warm and dry.     Coloration: Skin is pale.  Neurological:     General: No focal deficit present.     Mental Status: She is alert and oriented to person, place, and time.     Cranial Nerves: No cranial nerve deficit.     Motor: No weakness.     Gait: Gait normal.  Psychiatric:     Comments: A bit anxious and jittery (her baseline)     Labs reviewed: Basic Metabolic Panel: Recent Labs    05/09/19 1353 09/23/19 1604  11/28/19 1810 01/06/20 1102  NA  --  139 131* 137  K  --  4.0 4.2 4.3  CL  --  105 98 103  CO2  --  23 23 27   GLUCOSE  --  117 170* 119*  BUN  --  10 20 16   CREATININE  --  0.79 1.04* 0.76  CALCIUM  --  9.8 9.8 9.4  TSH 0.72  --   --   --    Liver Function Tests: Recent Labs    09/23/19 1604 11/28/19 1810  AST 13 17  ALT 10 15  ALKPHOS  --  53  BILITOT 0.3 0.4  PROT 7.5 7.5  ALBUMIN  --  3.8   Recent Labs    11/28/19 1810  LIPASE 32   No results for input(s): AMMONIA in the last 8760 hours. CBC: Recent Labs    09/23/19 1604 11/28/19 1810  WBC 6.8 11.5*  NEUTROABS 4,304 9.2*  HGB 13.7 13.1  HCT 40.2 37.5  MCV 92.6 92.6  PLT 267 219   Lipid Panel: Recent Labs    05/09/19 1353 01/06/20 1102  CHOL 167 153  HDL 48* 48*  LDLCALC 96 84  TRIG 126 110  CHOLHDL 3.5 3.2   Lab Results  Component Value Date   HGBA1C 5.9 (H) 01/06/2020     Assessment/Plan 1. Psoriasis of scalp -requesting renewal of her nizoral shampoo - ketoconazole (NIZORAL) 2 % shampoo; Apply topically 2 (two) times a week.  Dispense: 360 mL; Refill: 1  2. Annular psoriasis -cont current therapy for areas of neck and for scalp above - ketoconazole (NIZORAL) 2 % shampoo; Apply  topically 2 (two) times a week.  Dispense: 360 mL; Refill: 1  3. Spasm of back muscles -when she tries new exercises or does something at home, she sometimes pulls a muscle and likes to have this in case -reviewed risks especially in combination with her anxiolytic, hypnotic for falls, confusion - tiZANidine (ZANAFLEX) 4 MG capsule; Take 1 capsule (4 mg total) by mouth daily as needed for muscle spasms.  Dispense: 30 capsule; Refill: 0  4. Essential hypertension, benign -bp controlled with current regimen and should get even better with regular exercise again - Basic metabolic panel; Future  5. Prediabetes -doing ok but should improve with regular exercise and improved eating habits again when she gets her own  groceries Lab Results  Component Value Date   HGBA1C 5.9 (H) 01/06/2020  - Hemoglobin A1c; Future  6. Mixed hyperlipidemia -slightly above goal--we both thought she could get this down with return to the gym rather than an increase in statin therapy Lab Results  Component Value Date   [place] 84 01/06/2020   - Lipid panel; Future  7. Hypothyroidism, unspecified type -cont current levothyroxine - Lab Results  Component Value Date   TSH 0.72 05/09/2019   8. Financial difficulties -struggling to pay for many things including her dental work longstanding -she's also struggling to communicate well with her insurance and pharmacy benefit -suggested she reach out to [facility] for some assistance through care management with this -I am aware of her [place] list meds but she has not tolerated weaning this over the past 10 years though I've tried  Labs/tests ordered:   Lab Orders     Basic metabolic panel     Hemoglobin A1c     Lipid panel  Next appt: 05/14/2020 med mgt, fasting labs before  Johneisha Broaden L. Maysel Mccolm, D.O. [facility] [address], [place] [address] Cell Phone (Mon-Fri 8am-5pm):  [phone] On Call:  [phone] & follow prompts after 5pm & weekends Office Phone:  [phone] Office Fax:  [phone]

## 2020-01-21 ENCOUNTER — Other Ambulatory Visit: Payer: Self-pay | Admitting: *Deleted

## 2020-01-21 NOTE — Patient Outreach (Signed)
[facility]) Care Management  01/21/2020  JUBILEE SCHNECK 1946-06-30 [id number]   Referral Date: 4/13 Referral Source: MD office Referral Reason: -struggling to pay for many things including her dental work longstanding -she's also struggling to communicate well with her insurance and pharmacy benefit Insurance: [employer] attempt #1, unsuccessful.  HIPAA compliant voice message left.  Plan: RN CM will send unsuccessful outreach letter and follow up within the next 3-4 business days.  Valente David, [place], MSN [place] [phone]

## 2020-01-24 ENCOUNTER — Other Ambulatory Visit: Payer: Self-pay | Admitting: *Deleted

## 2020-01-24 NOTE — Patient Outreach (Signed)
[facility] [facility]) Care Management  01/24/2020  MAUREN MELBY [birth year] [id number]   Referral Date: 4/13 Referral Source: MD office Referral Reason: -struggling to pay for many things including her dental work longstanding -she's also struggling to communicate well with her insurance and pharmacy benefit Insurance: [employer] attempt #2, unsuccessful.  HIPAA compliant voice message left.  Plan: RN CM will follow up within the next 3-4 business days.  Valente David, [place], MSN [place] [phone]

## 2020-01-30 ENCOUNTER — Other Ambulatory Visit: Payer: Self-pay | Admitting: *Deleted

## 2020-01-30 ENCOUNTER — Other Ambulatory Visit: Payer: Self-pay | Admitting: Internal Medicine

## 2020-01-30 NOTE — Telephone Encounter (Signed)
Received [place]scribe request from pharmacy [facility] Verified LR: 10/16/2019 Pended Rx and sent to Dr. Mariea Clonts for approval.

## 2020-01-30 NOTE — Patient Outreach (Signed)
[facility]) Care Management  01/30/2020  Kelli [name] 1946/01/18 [id number]   Referral Date:4/13 Referral Source:MD office Referral Reason:-struggling to pay for many things including her dental work longstanding -she's also struggling to communicate well with her insurance and pharmacy benefit [place] attempt #3, successful, identity verified.  This care manager introduced self and stated purpose of call.  [facility] care management services explained.  Social: Lives alone and is independent in her care but daughter Kelli [name] is also involved.  Patient has been having trouble managing stress related to her finances.  She is in need of dental work but afraid to get it done due to the cost.  She is also having issues with her home, several items need repair including leaking.  Recently had issue with her bank and account is now overdrafted, bank will replace the money but she will have bills due before bank has resolved the issue.  Conditions: Per chart, has history of HTN, TIA, Hypothyroid, HLD, and anxiety/depression.  Medications: Reviewed with member, denies any issues with affording them or with management.    Appointments: Had appointment with PCP on 4/8, next visit scheduled for 8/12.  Provides her own transportation, all chronic conditions are controlled and stable at this time.    Plan: RN CM will place referral to social worker and health coach.  Kelli [name], [place], MSN [place] [phone]

## 2020-01-31 ENCOUNTER — Other Ambulatory Visit: Payer: Self-pay | Admitting: *Deleted

## 2020-02-03 ENCOUNTER — Telehealth: Payer: Self-pay | Admitting: *Deleted

## 2020-02-03 NOTE — Patient Outreach (Signed)
[facility] [facility]) Care Management  02/03/2020  Kelli [name] Feb 05, 1946 [id number]   CSW received referral from [place] that patient has trouble managing stress levels. Concerned about her finances. Need dental work but feels it will be too expensive. May need assistance with paying next month's bills. There was an issue with her bank and her account was over-drafted. They will be replacing the funds but will take time. Also concerned about needing work done to her home and not being able to pay for it.   CSW made an initial attempt to try and contact patient today to perform phone assessment, as well as assess and assist with social needs and services, without success. A HIPPA compliant message was left for patient on voicemail (ph#: [phone]). CSW also attempted to reach out to [employer] to inquire about making a referral, but had to leave a voicemail for them as well (ph#: [phone]). CSW is currently awaiting a return call. CSW will have CMA mail unsuccessful outreach letter along with Aging Gracefully brochure & make a second outreach attempt within the next 3-4 days, if CSW does not receive a return call from patient in the meantime.    Raynaldo Opitz, LCSW [facility]  Clinical Social Worker cell #: [phone]

## 2020-02-06 ENCOUNTER — Encounter: Payer: Self-pay | Admitting: *Deleted

## 2020-02-06 ENCOUNTER — Other Ambulatory Visit: Payer: Self-pay | Admitting: *Deleted

## 2020-02-06 NOTE — Progress Notes (Signed)
Feel free to tell her that I recommended you call her.  We did talk about it at her visit.  That should help.

## 2020-02-06 NOTE — Patient Outreach (Signed)
[facility]) Care Management  02/06/2020  Kelli [name] [birth year] [id number]   CSW made a second attempt to try and contact patient today to perform the initial phone assessment, as well as assess and assist with social work needs and services, without success.  A HIPAA compliant message was left for patient on voicemail.  CSW continues to await a return call.  CSW will make a third and final outreach attempt on Thursday, Feb 13, 2020, around 9:00am, if a return call is not received from patient in the meantime.  CSW will then proceed with case closure if a return call is not received from patient within a total of 10 business days, as required number of phone attempts will have been made and outreach letter mailed.   Nat Christen, BSW, MSW, LCSW  Licensed Education officer, environmental Health System  Mailing [place] [address], [place], [place] [address] Physical Address-[address], [place],  [address] Toll Free Main # [phone] Fax # [phone] Cell # [phone]  Office # [phone] [email or web address]

## 2020-02-12 ENCOUNTER — Other Ambulatory Visit: Payer: Self-pay | Admitting: *Deleted

## 2020-02-12 NOTE — Patient Outreach (Signed)
Kelli [name]) Care Management  02/12/2020  Kelli [name] Sep 28, 1946 [id number]    CSW made a third and final attempt to try and contact patient today to perform phone assessment, as well as assess and assist with social work needs and services, without success.  A HIPAA compliant message was left for patient on voicemail.  CSW is currently awaiting a return call.  CSW will proceed with case closure in three business days, if a return call is not received from patient in the meantime, as required number of phone attempts have been made and an outreach letter was mailed to patient's home allowing 10 business days for a response if patient was interested in receiving social work services through [place] with Scientist, clinical (histocompatibility and immunogenetics).  CSW will encourage patient's RNCM, Valente David, also with [place] Management, to have patient contact CSW directly, during her next successful outreach attempt to patient.  Kelli [name], BSW, MSW, LCSW  Licensed Education officer, environmental Health System  Mailing [place] [address], [place], [place] [address] Physical Address[address], [place], [place] [address] Toll Free Main # [phone] Fax # [phone] Cell # [phone]  Office # [phone] [name]

## 2020-02-13 ENCOUNTER — Ambulatory Visit: Payer: Self-pay | Admitting: *Deleted

## 2020-02-13 ENCOUNTER — Other Ambulatory Visit: Payer: Self-pay | Admitting: *Deleted

## 2020-02-13 NOTE — Patient Outreach (Signed)
[place] [facility] [facility]) Care Management  02/13/2020  Kelli [name] 10-24-1945 [id number]  Unsuccessful outreach attempt made to patient.  RN Health Coach left HIPAA compliant voicemail message along with her contact information.  Plan: RN Health Coach will call patient within the month of June.  [name] RN, BSN [place] [phone] [email or web address]

## 2020-02-17 ENCOUNTER — Encounter: Payer: Self-pay | Admitting: *Deleted

## 2020-02-17 ENCOUNTER — Other Ambulatory Visit: Payer: Self-pay | Admitting: *Deleted

## 2020-02-17 NOTE — Patient Outreach (Signed)
[place] [facility]) Care Management  02/17/2020  Kelli [name] 07-30-1946 [id number]    CSW will perform a case closure on patient, due to inability to establish initial phone contact, despite required number of phone attempts made and outreach letter mailed to patient's home, allowing 10 business days for a response if patient was interested in receiving social work services and resources through [place] with Scientist, clinical (histocompatibility and immunogenetics).  CSW will notify patient's RNCM, and individual placing initial referral, also with [place] Management, [name] of CSW's plans to close patient's case.  CSW will fax an update to patient's Primary Care Physician, Dr. Hollace Kinnier to ensure that they are aware of CSW's involvement with patient's plan of care.   Nat Christen, BSW, MSW, LCSW  Licensed Education officer, environmental Health System  Mailing [place] [address] [place], [place] [address] Physical Address-[address], [place], [place] [address] Toll Free Main # [phone] Fax # [phone] Cell # [phone]  Office # [phone] [email or web address]

## 2020-02-26 ENCOUNTER — Telehealth: Payer: Self-pay

## 2020-02-26 NOTE — Telephone Encounter (Signed)
I've been getting messages that they are trying to reach her and she needs to call them back.

## 2020-02-26 NOTE — Telephone Encounter (Addendum)
Patient called to give a phone number for [facility] as they instructed her to do so Dr. Mariea Clonts could call and speak with them about her the number for them is [phone]

## 2020-02-27 ENCOUNTER — Other Ambulatory Visit: Payer: Self-pay | Admitting: *Deleted

## 2020-02-27 NOTE — Patient Outreach (Signed)
[place] [facility]) Care Management  02/27/2020  CLARITA SEACAT [birth year] [id number]  Unsuccessful outreach attempt made to patient.  RN Health Coach left HIPAA compliant voicemail message along with her contact information.  Plan: RN Health Coach will call patient within the month of June.  [name] RN, BSN [place] [phone] [email or web address]

## 2020-02-27 NOTE — Telephone Encounter (Signed)
FYI; I called and spoke with the patient about what doctor Mariea Clonts said for her to call [place] that they where trying to get in touch with her and she began to tell me about all her needs and things that have been happening to her I asked her if she would be willing to try this again she replied yes I called and spoke with Verlon Setting at [facility] and she said she would contact patients worker [name] as patients account was still open no further actions required

## 2020-03-03 ENCOUNTER — Other Ambulatory Visit: Payer: Self-pay | Admitting: Internal Medicine

## 2020-03-03 ENCOUNTER — Telehealth: Payer: Self-pay

## 2020-03-03 NOTE — Telephone Encounter (Signed)
Received Prior Auth paperwork for Zolpidem from [facility] [phone] Fax: [phone] Placed in Dr. Cyndi Lennert folder to review, fill out and sign. To be faxed back once completed.  ID: [id number]

## 2020-03-03 NOTE — Telephone Encounter (Signed)
[place] has been on the phone 2 hours talking to [facility] about her Ambien being refilled. She got frustrated and after waiting on hold for a long time she hung up and called here for help. The initial call was about [place] refilling her prescription, but it turns out she needed a PA.   Patients last visit was 01/2020... [facility] said they sent a fax to Dr. Mariea Clonts. (PT2)   I just received the fax it is for a prior authorization I will sent this to [place] or [place] to fill out so she can get her meds. Please advise.

## 2020-03-05 ENCOUNTER — Ambulatory Visit: Payer: Self-pay | Admitting: *Deleted

## 2020-03-05 ENCOUNTER — Other Ambulatory Visit: Payer: Self-pay | Admitting: *Deleted

## 2020-03-05 NOTE — Patient Outreach (Addendum)
[place] [facility]) Care Management  03/05/2020  JUANA DILAURA [birth year] [id number]   Third unsuccessful outreach attempt made to patient. RN Health Coach left HIPAA compliant voicemail message along with her contact information.  Plan: RN Health Coach will call patient within the month of July and will send an unsuccessful letter to patient.   [name] RN, BSN [place] [phone] [email or web address]

## 2020-03-09 ENCOUNTER — Other Ambulatory Visit: Payer: Self-pay | Admitting: *Deleted

## 2020-03-09 MED ORDER — ESTRADIOL 1 MG PO TABS: 1.0000 mg | ORAL_TABLET | Freq: Every day | ORAL | 1 refills | Status: DC

## 2020-03-09 NOTE — Telephone Encounter (Signed)
Patient requested refill to be sent to mail order.  

## 2020-03-10 ENCOUNTER — Telehealth: Payer: Self-pay

## 2020-03-10 DIAGNOSIS — F5104 Psychophysiologic insomnia: Secondary | ICD-10-CM

## 2020-03-10 MED ORDER — DOXEPIN HCL 3 MG PO TABS: 1.0000 | ORAL_TABLET | Freq: Every evening | ORAL | 1 refills | Status: DC | PRN

## 2020-03-10 NOTE — Telephone Encounter (Signed)
New Rx for the doxepin 3mg  qhs as needed for sleep sent to mail order pharmacy.

## 2020-03-10 NOTE — Telephone Encounter (Signed)
I called patient to inform her that [facility] denied her request for Ambien because  Contraindications and inadequate treatments response or intolerance to non HRM's. They did suggest an alternative medication called Doxepin (3mg  or 6 mg). Kelli [name] was very upset with the decision and said she wished she had never signed up with the company. She was acceptable of the new medication. Please advise?

## 2020-03-10 NOTE — Addendum Note (Signed)
Addended by: Gayland Curry on: 03/10/2020 05:52 PM   Modules accepted: Orders

## 2020-03-11 MED ORDER — ZOLPIDEM TARTRATE 5 MG PO TABS: 5.0000 mg | ORAL_TABLET | Freq: Every day | ORAL | 0 refills | Status: DC

## 2020-03-11 NOTE — Addendum Note (Signed)
Addended by: Rafael Bihari A on: 03/11/2020 03:48 PM   Modules accepted: Orders

## 2020-03-11 NOTE — Addendum Note (Signed)
Addended by: Gayland Curry on: 03/11/2020 03:54 PM   Modules accepted: Orders

## 2020-03-11 NOTE — Telephone Encounter (Signed)
Patient notified and agreed. Stated that she will try the alternative.

## 2020-03-11 NOTE — Telephone Encounter (Signed)
She could continue on it if she pays for it out of pocket.  Her insurance does not want to cover it.

## 2020-03-11 NOTE — Telephone Encounter (Signed)
Patient called and stated that she has called around to different pharmacies to get out of pocket expense for Zolpidem.   She stated that [facility] is only going to charge out of pocket for Zolpidem $15. Patient stated that she would like for a Rx to be sent to [facility] and the other alternative to be taken out of her medication list.   Please Advise.

## 2020-03-11 NOTE — Telephone Encounter (Signed)
Patient called and stated that she received a call from someone yesterday regarding her Zolpidem Rx. Stated that she would like to speak with them.  Patient stated that she DOES NOT want another Rx called into pharmacy  because she has been on Zolpidem for years and never had a problem. Stated that it works well and she want to continue on it. Wants to know what Dr. Mariea Clonts can do to make this happen.  Please Advise.

## 2020-03-11 NOTE — Telephone Encounter (Signed)
Ok.  Rx signed

## 2020-03-12 DIAGNOSIS — H0288B Meibomian gland dysfunction left eye, upper and lower eyelids: Secondary | ICD-10-CM | POA: Diagnosis not present

## 2020-03-12 DIAGNOSIS — H0288A Meibomian gland dysfunction right eye, upper and lower eyelids: Secondary | ICD-10-CM | POA: Diagnosis not present

## 2020-03-12 DIAGNOSIS — H5203 Hypermetropia, bilateral: Secondary | ICD-10-CM | POA: Diagnosis not present

## 2020-03-12 DIAGNOSIS — H524 Presbyopia: Secondary | ICD-10-CM | POA: Diagnosis not present

## 2020-03-12 DIAGNOSIS — H16223 Keratoconjunctivitis sicca, not specified as Sjogren's, bilateral: Secondary | ICD-10-CM | POA: Diagnosis not present

## 2020-03-12 DIAGNOSIS — H52223 Regular astigmatism, bilateral: Secondary | ICD-10-CM | POA: Diagnosis not present

## 2020-03-18 ENCOUNTER — Other Ambulatory Visit: Payer: Self-pay | Admitting: *Deleted

## 2020-03-18 ENCOUNTER — Telehealth: Payer: Self-pay

## 2020-03-18 MED ORDER — MEDROXYPROGESTERONE ACETATE 2.5 MG PO TABS: 2.5000 mg | ORAL_TABLET | Freq: Every day | ORAL | 1 refills | Status: DC

## 2020-03-18 NOTE — Telephone Encounter (Signed)
Received Refill Request from [facility]

## 2020-03-18 NOTE — Telephone Encounter (Signed)
Patient called stating that [facility] would [facility] [address]. She also stated that the new sleep medication that she was prescribed is going to cost her over $200, she would like to know if there is something else you could call in for her. Patient would like to be called before anything new for sleep is called in so that she can call and see how much it will cost.[facility]acy [facility]. Please advise.

## 2020-03-18 NOTE — Telephone Encounter (Signed)
She did not want to take the alternative medication that the insurance company would cover and wanted Lorrin Mais called in.   She could use over the counter alternatives which are not any safer than the ambien for sleep in older adults (like unisom, tylenol pm, benadryl, or zquil).

## 2020-03-20 NOTE — Telephone Encounter (Signed)
Called and left message for patient to give the office a call regarding sleep medication concern and to give her Dr. Cyndi Lennert response and recommendations.

## 2020-03-20 NOTE — Telephone Encounter (Signed)
Patient called back and was given Dr. Cyndi Lennert recommendations for sleep medications. Pt verbalized her understanding and agreed.

## 2020-03-24 ENCOUNTER — Telehealth: Payer: Self-pay | Admitting: *Deleted

## 2020-03-24 NOTE — Telephone Encounter (Signed)
Patient called requesting [place] to call her pharmacy and cancel the Rx for Doxepin. Patient stayed on Zolpidem and pays out of pocket.  Stated the they charged her $281 for the Doxepin and she doesn't want it.  Wanted [place] to call and Cancel. [phone]  Called and spoke with Ernst Bowler, She canceled Rx.

## 2020-03-26 ENCOUNTER — Other Ambulatory Visit: Payer: Self-pay | Admitting: Internal Medicine

## 2020-03-26 NOTE — Telephone Encounter (Signed)
rx sent to pharmacy by e-script  

## 2020-03-31 ENCOUNTER — Telehealth: Payer: Self-pay

## 2020-03-31 DIAGNOSIS — H0288A Meibomian gland dysfunction right eye, upper and lower eyelids: Secondary | ICD-10-CM | POA: Diagnosis not present

## 2020-03-31 DIAGNOSIS — H16223 Keratoconjunctivitis sicca, not specified as Sjogren's, bilateral: Secondary | ICD-10-CM | POA: Diagnosis not present

## 2020-03-31 DIAGNOSIS — H0288B Meibomian gland dysfunction left eye, upper and lower eyelids: Secondary | ICD-10-CM | POA: Diagnosis not present

## 2020-03-31 DIAGNOSIS — H40013 Open angle with borderline findings, low risk, bilateral: Secondary | ICD-10-CM | POA: Diagnosis not present

## 2020-03-31 DIAGNOSIS — H18413 Arcus senilis, bilateral: Secondary | ICD-10-CM | POA: Diagnosis not present

## 2020-03-31 DIAGNOSIS — H532 Diplopia: Secondary | ICD-10-CM | POA: Diagnosis not present

## 2020-03-31 DIAGNOSIS — H11153 Pinguecula, bilateral: Secondary | ICD-10-CM | POA: Diagnosis not present

## 2020-03-31 NOTE — Telephone Encounter (Signed)
Mrs. Patlan called with concern about her medication Doxipen. She wanted to make sure it was canceled, per Anita May it was canceled on 03/24/20. Noted in her notes. She also stated the medication is to expensive at 281.00

## 2020-04-06 ENCOUNTER — Ambulatory Visit: Payer: Self-pay | Admitting: *Deleted

## 2020-04-08 ENCOUNTER — Other Ambulatory Visit: Payer: Self-pay | Admitting: *Deleted

## 2020-04-08 NOTE — Patient Outreach (Signed)
[place] [facility]) Care Management  04/08/2020  RICHELLE GLICK 04-22-1946 [id number]  Unsuccessful outreach attempt made to patient. RN Health Coach left HIPAA compliant voicemail message along with her contact information.  Plan: RN Health Coach will call patient within the month of August.  [name] RN, BSN [place] [phone] [email or web address]

## 2020-04-09 ENCOUNTER — Telehealth: Payer: Self-pay | Admitting: *Deleted

## 2020-04-09 DIAGNOSIS — Z79890 Hormone replacement therapy (postmenopausal): Secondary | ICD-10-CM

## 2020-04-09 NOTE — Telephone Encounter (Signed)
Referral entered to Dr. Josefa Half

## 2020-04-09 NOTE — Telephone Encounter (Signed)
Patient called and stated that her GYN Dr at [facility] is retiring. Stated that she only went and saw him every couple of years. Patient is wanting another referral to someone else local around our office.  And wants you to recommend and place a referral.  Please Advise.

## 2020-04-15 NOTE — Telephone Encounter (Signed)
Patient called to check the status of her referral. I provided patient with the name, address, and phone number for the GYN listed on GYN referral.

## 2020-04-16 ENCOUNTER — Other Ambulatory Visit: Payer: Self-pay | Admitting: Internal Medicine

## 2020-04-16 DIAGNOSIS — F32A Depression, unspecified: Secondary | ICD-10-CM

## 2020-04-16 NOTE — Telephone Encounter (Signed)
rx sent to pharmacy by e-script  

## 2020-05-07 ENCOUNTER — Other Ambulatory Visit: Payer: Self-pay | Admitting: *Deleted

## 2020-05-07 MED ORDER — ALPRAZOLAM 1 MG PO TABS: ORAL_TABLET | ORAL | 0 refills | Status: DC

## 2020-05-07 NOTE — Telephone Encounter (Signed)
Patient requested refill Requested to be sent to [facility] LR: 01/30/20 Need to sign contract, added to future appt. Pended Rx and sent to Dr. Mariea Clonts for approval.

## 2020-05-08 ENCOUNTER — Other Ambulatory Visit: Payer: Self-pay

## 2020-05-08 ENCOUNTER — Other Ambulatory Visit: Payer: Self-pay | Admitting: *Deleted

## 2020-05-08 DIAGNOSIS — F5104 Psychophysiologic insomnia: Secondary | ICD-10-CM

## 2020-05-08 DIAGNOSIS — R7303 Prediabetes: Secondary | ICD-10-CM

## 2020-05-08 DIAGNOSIS — I10 Essential (primary) hypertension: Secondary | ICD-10-CM

## 2020-05-08 DIAGNOSIS — E782 Mixed hyperlipidemia: Secondary | ICD-10-CM

## 2020-05-08 MED ORDER — ZOLPIDEM TARTRATE 5 MG PO TABS: 5.0000 mg | ORAL_TABLET | Freq: Every day | ORAL | 0 refills | Status: DC

## 2020-05-08 NOTE — Telephone Encounter (Signed)
Patient requested refill Epic LR: 03/11/20 Needs contract, added note to next appt. Pended Rx and sent to Dr. Mariea Clonts for approval.

## 2020-05-11 ENCOUNTER — Other Ambulatory Visit: Payer: Medicare Other

## 2020-05-14 ENCOUNTER — Ambulatory Visit (INDEPENDENT_AMBULATORY_CARE_PROVIDER_SITE_OTHER): Payer: Medicare Other | Admitting: Internal Medicine

## 2020-05-14 ENCOUNTER — Encounter: Payer: Self-pay | Admitting: Internal Medicine

## 2020-05-14 ENCOUNTER — Other Ambulatory Visit: Payer: Self-pay

## 2020-05-14 VITALS — BP 126/86 | HR 75 | Temp 97.8°F | Ht 59.0 in | Wt 135.8 lb

## 2020-05-14 DIAGNOSIS — F329 Major depressive disorder, single episode, unspecified: Secondary | ICD-10-CM | POA: Diagnosis not present

## 2020-05-14 DIAGNOSIS — E782 Mixed hyperlipidemia: Secondary | ICD-10-CM | POA: Diagnosis not present

## 2020-05-14 DIAGNOSIS — R7303 Prediabetes: Secondary | ICD-10-CM | POA: Diagnosis not present

## 2020-05-14 DIAGNOSIS — F5104 Psychophysiologic insomnia: Secondary | ICD-10-CM

## 2020-05-14 DIAGNOSIS — F419 Anxiety disorder, unspecified: Secondary | ICD-10-CM | POA: Diagnosis not present

## 2020-05-14 DIAGNOSIS — F32A Depression, unspecified: Secondary | ICD-10-CM

## 2020-05-14 DIAGNOSIS — I10 Essential (primary) hypertension: Secondary | ICD-10-CM

## 2020-05-14 DIAGNOSIS — E039 Hypothyroidism, unspecified: Secondary | ICD-10-CM

## 2020-05-14 LAB — TSH: TSH: 2.64 mIU/L (ref 0.40–4.50)

## 2020-05-14 NOTE — Progress Notes (Signed)
Location:  [facility] clinic Provider:  Shemeka Wardle L. Mariea Clonts, D.O., C.M.D.  Goals of Care:  Advanced Directives 05/14/2020  Does Patient Have a Medical Advance Directive? No  Type of Advance Directive -  Does patient want to make changes to medical advance directive? -  Copy of [place] in Chart? -  Would patient like information on creating a medical advance directive? No - Patient declined     Chief Complaint  Patient presents with  . Medical Management of Chronic Issues    4 month follow up     HPI: Patient is a 74 y.o. female seen today for medical management of chronic diseases.    She's down 3-4 lbs from April.  Is eating fish 2-3 times per week.    She knows she needs dental work done, but it's the last place she wants to go.  She is just going until something happens b/c she cannot tolerate numbing and she cannot be put to sleep (she says).    Her daughter was ill.  They thought she had covid.  She had gone to urgent care and she was taking to [facility].  She was very sick with sepsis from kidney stones with infection.  She's remained weak though she's back to work.  Pt depends on her so much.  She's had a friend take her to visit.  She stayed with her for 10 days to help her with meals etc.    She had been getting very frustrated with her insurance company--to get mail order.  Is back to getting her Rxs at [facility] for much cheaper now after she'd been told she had to do mail order at first and was going to be charged $199 vs $7.  She's having plumbing problems.  She tells me about her cats which have worms and she's having challenges getting them to the vet for treatment.   She had her eyes examined and her glasses did not work.  She has to wait to get a new exam for some reason.  Says she can see better w/o glasses except with reading.    Has difficulty remembering names like [place].  Got lost when she had to take a detour recently.   Does not put much in her phone only what she really needs--sometimes it won't work right.  Says she does not have self-confidence which certainly seems true. Talks about several other words she could not remember.  Her sister took prevagen.  She got 30 pills.    Got back to gym for a bit before the construction near her home and the increasing covid delta.  Past Medical History:  Diagnosis Date  . Anxiety   . Cataract    BILATERAL REMOVAL  . Chronic kidney disease    KIDNEY STONES 2 TIMES IN 20 YEARS   . Colon polyp    Tubular Adenoma  . Colon polyp, hyperplastic   . DDD (degenerative disc disease), lumbar   . Diverticulosis   . Flushing   . Hyperlipidemia   . Hypertension   . Hypertonicity of bladder   . Insomnia, unspecified   . Internal hemorrhoid   . Other abnormal blood chemistry   . Sigmoid polyp   . Thyroid disease     Past Surgical History:  Procedure Laterality Date  . [place]   has bilateral silicone implants  . CHOLECYSTECTOMY    . COLONOSCOPY  11/24/2015   Dr. Hilarie Fredrickson  .  [place]  . POLYPECTOMY    . ROTATOR CUFF REPAIR  2013    No Known Allergies  Outpatient Encounter Medications as of 05/14/2020  Medication Sig  . ALPRAZolam (XANAX) 1 MG tablet TAKE 2 TABLETS EVERY NIGHT AT BEDTIME AS NEEDED FOR   ANXIETY  . buPROPion (WELLBUTRIN XL) 300 MG 24 hr tablet TAKE 1 TABLET DAILY  . diclofenac sodium (VOLTAREN) 1 % GEL Apply 2 g topically 4 (four) times daily. To right shoulder for osteoarthritis  . estradiol (ESTRACE) 1 MG tablet Take 1 tablet (1 mg total) by mouth daily.  . fexofenadine (ALLEGRA ALLERGY) 180 MG tablet Take 1 tablet (180 mg total) by mouth daily.  Marland Kitchen levothyroxine (SYNTHROID) 88 MCG tablet TAKE 1 TABLET ONCE DAILY 30MINUTES BEFORE BREAKFAST   FOR THYROID  . losartan (COZAAR) 25 MG tablet TAKE 1 TABLET DAILY  . medroxyPROGESTERone (PROVERA) 2.5 MG tablet Take 1 tablet (2.5 mg total) by mouth daily.   . simvastatin (ZOCOR) 40 MG tablet TAKE 1 TABLET DAILY FOR    CHOLESTEROL  . tiZANidine (ZANAFLEX) 4 MG capsule Take 1 capsule (4 mg total) by mouth daily as needed for muscle spasms.  Marland Kitchen zolpidem (AMBIEN) 5 MG tablet Take 1 tablet (5 mg total) by mouth at bedtime.  . [DISCONTINUED] aspirin EC 81 MG tablet Take 1 tablet (81 mg total) by mouth daily.  . [DISCONTINUED] ketoconazole (NIZORAL) 2 % shampoo Apply topically 2 (two) times a week.  . [DISCONTINUED] ketorolac (TORADOL) 10 MG tablet Take 1 tablet (10 mg total) by mouth every 6 (six) hours as needed for moderate pain or severe pain.  . [DISCONTINUED] ondansetron (ZOFRAN ODT) 4 MG disintegrating tablet Take 1 tablet (4 mg total) by mouth every 8 (eight) hours as needed for nausea or vomiting.  . [DISCONTINUED] polyethylene glycol powder (GLYCOLAX/MIRALAX) 17 GM/SCOOP powder Take one capful by mouth twice daily as needed   No facility-administered encounter medications on file as of 05/14/2020.    Review of Systems:  Review of Systems  Constitutional: Negative for chills and fever.  HENT: Negative for congestion.   Eyes: Positive for blurred vision.  Respiratory: Negative for cough and shortness of breath.   Cardiovascular: Negative for chest pain, palpitations and leg swelling.  Gastrointestinal: Negative for abdominal pain, blood in stool, constipation, diarrhea and melena.  Genitourinary: Negative for dysuria.  Musculoskeletal: Negative for back pain, falls and joint pain.  Skin: Positive for rash. Negative for itching.  Neurological: Negative for dizziness and loss of consciousness.  Psychiatric/Behavioral: Positive for depression. The patient is nervous/anxious and has insomnia.     Health Maintenance  Topic Date Due  . TETANUS/TDAP  10/03/2018  . INFLUENZA VACCINE  05/03/2020  . COLONOSCOPY  08/01/2022  . DEXA SCAN  Completed  . COVID-19 Vaccine  Completed  . Hepatitis C Screening  Completed  . PNA vac Low Risk Adult   Completed    Physical Exam: Vitals:   05/14/20 1408  BP: 126/86  Pulse: 75  Temp: 97.8 F (36.6 C)  TempSrc: Temporal  SpO2: 98%  Weight: 135 lb 12.8 oz (61.6 kg)  Height: 4\' 11"  (1.499 m)   Body mass index is 27.43 kg/m. Physical Exam Vitals reviewed.  Constitutional:      Appearance: Normal appearance.  HENT:     Head: Normocephalic and atraumatic.  Cardiovascular:     Rate and Rhythm: Normal rate and regular rhythm.  Pulmonary:     Effort: Pulmonary effort is normal.  Breath sounds: Normal breath sounds. No wheezing, rhonchi or rales.  Abdominal:     General: Bowel sounds are normal.  Musculoskeletal:        General: Normal range of motion.     Right lower leg: No edema.     Left lower leg: No edema.  Skin:    Comments: Scaly skin of scalp, posterior neck, behind ears  Neurological:     General: No focal deficit present.     Mental Status: She is alert and oriented to person, place, and time.  Psychiatric:        Mood and Affect: Mood normal.     Comments: Unhappy with everything right now, not even getting pleasure from her cats recently     Labs reviewed: Basic Metabolic Panel: Recent Labs    09/23/19 1604 11/28/19 1810 01/06/20 1102  NA 139 131* 137  K 4.0 4.2 4.3  CL 105 98 103  CO2 23 23 27   GLUCOSE 117 170* 119*  BUN 10 20 16   CREATININE 0.79 1.04* 0.76  CALCIUM 9.8 9.8 9.4   Liver Function Tests: Recent Labs    09/23/19 1604 11/28/19 1810  AST 13 17  ALT 10 15  ALKPHOS  --  53  BILITOT 0.3 0.4  PROT 7.5 7.5  ALBUMIN  --  3.8   Recent Labs    11/28/19 1810  LIPASE 32   No results for input(s): AMMONIA in the last 8760 hours. CBC: Recent Labs    09/23/19 1604 11/28/19 1810  WBC 6.8 11.5*  NEUTROABS 4,304 9.2*  HGB 13.7 13.1  HCT 40.2 37.5  MCV 92.6 92.6  PLT 267 219   Lipid Panel: Recent Labs    01/06/20 1102  CHOL 153  HDL 48*  LDLCALC 84  TRIG 110  CHOLHDL 3.2   Lab Results  Component Value Date    HGBA1C 5.9 (H) 01/06/2020    Procedures since last visit: No results found.  Assessment/Plan 1. Prediabetes - did not have phlebotomist last week for labs so will check today -is down 4 lbs -no changes made today - Hemoglobin A1c  2. Essential hypertension, benign -bp controlled,cont same regimen and monitor - Basic metabolic panel - CBC with Differential/Platelet  3. Mixed hyperlipidemia -cont zocor, check flp in future, never fasting b/c comes in afternoon and does not like to drive to come extra times  4. Psychophysiological insomnia -cont ambien--not doing well w/o it  5. Hypothyroidism, unspecified type - cont current levothyroxine and f/u lab today - TSH  6. Anxiety and depression - cont wellbutrin and prn xanax at hs   Labs/tests ordered:   Lab Orders     TSH     Hemoglobin A1c     Basic metabolic panel     CBC with Differential/Platelet  Next appt:  09/17/2020   Libia Fazzini L. Temisha Murley, D.O. [facility] [address], [place] [address] Cell Phone (Mon-Fri 8am-5pm):  [phone] On Call:  [phone] & follow prompts after 5pm & weekends Office Phone:  [phone] Office Fax:  [phone]

## 2020-05-15 LAB — CBC WITH DIFFERENTIAL/PLATELET
Absolute Monocytes: 517 cells/uL (ref 200–950)
Basophils Absolute: 19 cells/uL (ref 0–200)
Basophils Relative: 0.3 %
Eosinophils Absolute: 19 cells/uL (ref 15–500)
Eosinophils Relative: 0.3 %
HCT: 40.2 % (ref 35.0–45.0)
Hemoglobin: 13.4 g/dL (ref 11.7–15.5)
Lymphs Abs: 2255 cells/uL (ref 850–3900)
MCH: 30.9 pg (ref 27.0–33.0)
MCHC: 33.3 g/dL (ref 32.0–36.0)
MCV: 92.8 fL (ref 80.0–100.0)
MPV: 9.5 fL (ref 7.5–12.5)
Monocytes Relative: 8.2 %
Neutro Abs: 3490 cells/uL (ref 1500–7800)
Neutrophils Relative %: 55.4 %
Platelets: 231 10*3/uL (ref 140–400)
RBC: 4.33 10*6/uL (ref 3.80–5.10)
RDW: 13.1 % (ref 11.0–15.0)
Total Lymphocyte: 35.8 %
WBC: 6.3 10*3/uL (ref 3.8–10.8)

## 2020-05-15 LAB — BASIC METABOLIC PANEL
BUN: 20 mg/dL (ref 7–25)
CO2: 29 mmol/L (ref 20–32)
Calcium: 9.9 mg/dL (ref 8.6–10.4)
Chloride: 103 mmol/L (ref 98–110)
Creat: 0.9 mg/dL (ref 0.60–0.93)
Glucose, Bld: 105 mg/dL — ABNORMAL HIGH (ref 65–99)
Potassium: 4.5 mmol/L (ref 3.5–5.3)
Sodium: 138 mmol/L (ref 135–146)

## 2020-05-15 LAB — HEMOGLOBIN A1C
Hgb A1c MFr Bld: 5.7 % of total Hgb — ABNORMAL HIGH (ref <5.7)
Mean Plasma Glucose: 117 (calc)
eAG (mmol/L): 6.5 (calc)

## 2020-05-15 NOTE — Progress Notes (Signed)
Thyroid is in normal range. Kidneys and electrolytes are normal. Blood counts are normal. Sugar average has improved to 5.7 from 5.9 which is fabulous.  Continue getting exercise somehow because it has helped.

## 2020-05-22 ENCOUNTER — Other Ambulatory Visit: Payer: Self-pay | Admitting: *Deleted

## 2020-05-22 NOTE — Patient Outreach (Signed)
[place] [facility]) Care Management  05/22/2020  JANAZIA SCHREIER [birth year] [id number]  Unsuccessful outreach attempt made to patient. RN Health Coach left HIPAA compliant voicemail message along with her contact information.  Plan: RN Health Coach will call patient within the month of September.  [name] RN, BSN [place] [phone] [email or web address]

## 2020-06-01 DIAGNOSIS — Z23 Encounter for immunization: Secondary | ICD-10-CM | POA: Diagnosis not present

## 2020-06-03 ENCOUNTER — Other Ambulatory Visit: Payer: Self-pay | Admitting: *Deleted

## 2020-06-03 DIAGNOSIS — F5104 Psychophysiologic insomnia: Secondary | ICD-10-CM

## 2020-06-03 MED ORDER — ZOLPIDEM TARTRATE 5 MG PO TABS: 5.0000 mg | ORAL_TABLET | Freq: Every day | ORAL | 0 refills | Status: DC

## 2020-06-03 MED ORDER — ALPRAZOLAM 1 MG PO TABS: ORAL_TABLET | ORAL | 0 refills | Status: DC

## 2020-06-03 NOTE — Telephone Encounter (Signed)
Patient requested refill Epic LR: 05/07/2020 Contract on file Pended Rx and sent to Dr. Mariea Clonts for approval.

## 2020-06-29 ENCOUNTER — Other Ambulatory Visit: Payer: Self-pay | Admitting: *Deleted

## 2020-06-29 NOTE — Patient Outreach (Addendum)
[place] [facility]) Care Management  06/29/2020  BRIGHID KOCH [birth year] [id number]   Unsuccessful outreach attempt made to patient. RN Health Coach left HIPAA compliant voicemail message along with her contact information.  Plan: RN Health Coach will call patient within the month of November and will send an unsuccessful letter.   [name] RN, BSN [place] [phone] [email or web address]

## 2020-07-05 DIAGNOSIS — Z23 Encounter for immunization: Secondary | ICD-10-CM | POA: Diagnosis not present

## 2020-07-11 ENCOUNTER — Other Ambulatory Visit: Payer: Self-pay | Admitting: Internal Medicine

## 2020-07-11 DIAGNOSIS — F5104 Psychophysiologic insomnia: Secondary | ICD-10-CM

## 2020-07-13 NOTE — Telephone Encounter (Signed)
Last labs 8/21 Last OV 8/21 Contract 05/14/20 Last refill 06/03/20

## 2020-07-15 ENCOUNTER — Telehealth: Payer: Self-pay | Admitting: *Deleted

## 2020-07-15 NOTE — Telephone Encounter (Signed)
Received Prior Authorization from Pharmacy for Zolpidem.  Initiated through [facility]. Went into determination.   Key: [id number] Rx#: [id number] PA Case #: [id number]

## 2020-07-16 ENCOUNTER — Telehealth: Payer: Self-pay | Admitting: *Deleted

## 2020-07-16 ENCOUNTER — Encounter: Payer: Self-pay | Admitting: *Deleted

## 2020-07-16 DIAGNOSIS — M62830 Muscle spasm of back: Secondary | ICD-10-CM

## 2020-07-16 NOTE — Telephone Encounter (Signed)
error 

## 2020-07-16 NOTE — Telephone Encounter (Signed)
Patient called requesting refill on Muscle Relaxer. Stated that you have given it to her in the past for her back spasms.  Pended Rx for Dr. Cyndi Lennert approval.

## 2020-07-16 NOTE — Telephone Encounter (Signed)
Has something happened that she's having more pain?  I'd prefer for her not to be on muscle relaxers on a regular basis.  In combination with her sleeping pills and anxiety medicines, they increase her risk of falls and confusion as she is getting older.  If she has hurt herself in some way also, she may need evaluation

## 2020-07-17 NOTE — Telephone Encounter (Signed)
Patient notified and agreed. Stated that she is going to get an appointment with her Orthopaedic.

## 2020-07-22 ENCOUNTER — Other Ambulatory Visit: Payer: Self-pay | Admitting: *Deleted

## 2020-07-22 DIAGNOSIS — H16223 Keratoconjunctivitis sicca, not specified as Sjogren's, bilateral: Secondary | ICD-10-CM | POA: Diagnosis not present

## 2020-07-22 DIAGNOSIS — H0288A Meibomian gland dysfunction right eye, upper and lower eyelids: Secondary | ICD-10-CM | POA: Diagnosis not present

## 2020-07-22 MED ORDER — LOSARTAN POTASSIUM 25 MG PO TABS
25.0000 mg | ORAL_TABLET | Freq: Every day | ORAL | 1 refills | Status: DC
Start: 2020-07-22 — End: 2021-01-22

## 2020-07-22 NOTE — Telephone Encounter (Signed)
Patient requested refill

## 2020-08-03 ENCOUNTER — Other Ambulatory Visit: Payer: Self-pay | Admitting: *Deleted

## 2020-08-03 NOTE — Patient Outreach (Signed)
[place] [facility]) Care Management  08/03/2020  Kelli [name] 29-Nov-1945 [id number]  Unsuccessful outreach attempt made to patient. RN Health Coach left HIPAA compliant voicemail message along with her contact information.  Plan: RN Health Coach will call patient within the month of December  [name] RN, [place] [phone] [name]

## 2020-08-04 ENCOUNTER — Ambulatory Visit: Payer: Self-pay | Admitting: *Deleted

## 2020-08-05 ENCOUNTER — Other Ambulatory Visit: Payer: Self-pay | Admitting: *Deleted

## 2020-08-05 MED ORDER — ESTRADIOL 1 MG PO TABS
1.0000 mg | ORAL_TABLET | Freq: Every day | ORAL | 1 refills | Status: DC
Start: 2020-08-05 — End: 2021-06-23

## 2020-08-05 NOTE — Telephone Encounter (Signed)
Patient requested refill to be sent to [facility]

## 2020-09-02 DIAGNOSIS — L304 Erythema intertrigo: Secondary | ICD-10-CM | POA: Diagnosis not present

## 2020-09-02 DIAGNOSIS — L218 Other seborrheic dermatitis: Secondary | ICD-10-CM | POA: Diagnosis not present

## 2020-09-09 ENCOUNTER — Other Ambulatory Visit: Payer: Self-pay | Admitting: *Deleted

## 2020-09-09 DIAGNOSIS — F32A Depression, unspecified: Secondary | ICD-10-CM

## 2020-09-09 MED ORDER — LEVOTHYROXINE SODIUM 88 MCG PO TABS: ORAL_TABLET | ORAL | 1 refills | Status: DC

## 2020-09-09 MED ORDER — SIMVASTATIN 40 MG PO TABS: ORAL_TABLET | ORAL | 1 refills | Status: DC

## 2020-09-09 MED ORDER — BUPROPION HCL ER (XL) 300 MG PO TB24: ORAL_TABLET | ORAL | 1 refills | Status: DC

## 2020-09-09 NOTE — Telephone Encounter (Signed)
Patient requested refills.

## 2020-09-14 ENCOUNTER — Other Ambulatory Visit: Payer: Medicare Other

## 2020-09-17 ENCOUNTER — Ambulatory Visit: Payer: Medicare Other | Admitting: Internal Medicine

## 2020-09-23 ENCOUNTER — Other Ambulatory Visit: Payer: Self-pay | Admitting: *Deleted

## 2020-09-23 NOTE — Patient Outreach (Signed)
[place] [facility]) Care Management  09/23/2020  JURY CASERTA March 20, 1946 [id number]  Unsuccessful outreach attempt made to patient. RN Health Coach left HIPAA compliant voicemail message along with her contact information.  Plan: RN Health Coach will call patient within the month of January.  [name] RN, BSN [place] [phone] [email or web address]

## 2020-09-28 ENCOUNTER — Other Ambulatory Visit: Payer: Self-pay | Admitting: *Deleted

## 2020-09-28 MED ORDER — MEDROXYPROGESTERONE ACETATE 2.5 MG PO TABS
2.5000 mg | ORAL_TABLET | Freq: Every day | ORAL | 1 refills | Status: DC
Start: 2020-09-28 — End: 2020-12-30

## 2020-09-28 NOTE — Telephone Encounter (Signed)
Patient requested refill

## 2020-09-30 ENCOUNTER — Other Ambulatory Visit: Payer: Medicare Other

## 2020-09-30 ENCOUNTER — Other Ambulatory Visit: Payer: Self-pay

## 2020-09-30 DIAGNOSIS — I10 Essential (primary) hypertension: Secondary | ICD-10-CM | POA: Diagnosis not present

## 2020-09-30 DIAGNOSIS — E782 Mixed hyperlipidemia: Secondary | ICD-10-CM | POA: Diagnosis not present

## 2020-09-30 DIAGNOSIS — R7303 Prediabetes: Secondary | ICD-10-CM

## 2020-10-01 LAB — BASIC METABOLIC PANEL WITH GFR
BUN: 13 mg/dL (ref 7–25)
CO2: 23 mmol/L (ref 20–32)
Calcium: 9.1 mg/dL (ref 8.6–10.4)
Chloride: 107 mmol/L (ref 98–110)
Creat: 0.87 mg/dL (ref 0.60–0.93)
GFR, Est African American: 75 mL/min/{1.73_m2} (ref >=60)
GFR, Est Non African American: 65 mL/min/{1.73_m2} (ref >=60)
Glucose, Bld: 116 mg/dL — ABNORMAL HIGH (ref 65–99)
Potassium: 3.9 mmol/L (ref 3.5–5.3)
Sodium: 138 mmol/L (ref 135–146)

## 2020-10-01 LAB — LIPID PANEL
Cholesterol: 157 mg/dL (ref <200)
HDL: 57 mg/dL (ref >=50)
LDL Cholesterol (Calc): 82 mg/dL (calc)
Non-HDL Cholesterol (Calc): 100 mg/dL (calc) (ref <130)
Total CHOL/HDL Ratio: 2.8 (calc) (ref <5.0)
Triglycerides: 86 mg/dL (ref <150)

## 2020-10-01 LAB — HEMOGLOBIN A1C
Hgb A1c MFr Bld: 5.8 % of total Hgb — ABNORMAL HIGH (ref <5.7)
Mean Plasma Glucose: 120 mg/dL
eAG (mmol/L): 6.6 mmol/L

## 2020-10-01 NOTE — Progress Notes (Signed)
Labs are fairly stable. LDL remains above 70 and sugar average in prediabetic range.  Kidneys are ok.

## 2020-10-05 ENCOUNTER — Ambulatory Visit: Payer: Medicare Other | Admitting: Internal Medicine

## 2020-10-21 ENCOUNTER — Telehealth: Payer: Self-pay

## 2020-10-21 NOTE — Telephone Encounter (Signed)
Patient called and asked to speak to Rodena Piety she said Rodena Piety had called her and she was returning the call I checked to see if a note was left about why she had called the patient but there was not so I will tell Rodena Piety so she can call patient back phone number is [phone]

## 2020-10-21 NOTE — Telephone Encounter (Signed)
Patient notified and chose to keep appointment tomorrow for MyChart Visit.  Patient had called and wanted to cancel due to bad weather. Patient thought she had an in office appointment.

## 2020-10-22 ENCOUNTER — Telehealth: Payer: Self-pay

## 2020-10-22 ENCOUNTER — Other Ambulatory Visit: Payer: Self-pay

## 2020-10-22 ENCOUNTER — Telehealth (INDEPENDENT_AMBULATORY_CARE_PROVIDER_SITE_OTHER): Payer: Medicare Other | Admitting: Internal Medicine

## 2020-10-22 ENCOUNTER — Encounter: Payer: Self-pay | Admitting: Internal Medicine

## 2020-10-22 DIAGNOSIS — L409 Psoriasis, unspecified: Secondary | ICD-10-CM

## 2020-10-22 DIAGNOSIS — F5104 Psychophysiologic insomnia: Secondary | ICD-10-CM

## 2020-10-22 DIAGNOSIS — E782 Mixed hyperlipidemia: Secondary | ICD-10-CM

## 2020-10-22 DIAGNOSIS — Z599 Problem related to housing and economic circumstances, unspecified: Secondary | ICD-10-CM | POA: Diagnosis not present

## 2020-10-22 DIAGNOSIS — F32A Depression, unspecified: Secondary | ICD-10-CM

## 2020-10-22 DIAGNOSIS — F419 Anxiety disorder, unspecified: Secondary | ICD-10-CM

## 2020-10-22 DIAGNOSIS — Z79890 Hormone replacement therapy (postmenopausal): Secondary | ICD-10-CM | POA: Insufficient documentation

## 2020-10-22 DIAGNOSIS — R7303 Prediabetes: Secondary | ICD-10-CM | POA: Diagnosis not present

## 2020-10-22 DIAGNOSIS — I10 Essential (primary) hypertension: Secondary | ICD-10-CM

## 2020-10-22 NOTE — Progress Notes (Signed)
This service is provided via telemedicine  No vital signs collected/recorded due to the encounter was a telemedicine visit.   Location of [name] (ex: home, work):  Home   [name] consents to a telephone visit: yes  Location of the provider (ex: office, home): [facility]  Name of any referring provider: Dr. Hollace Kinnier DO  Names of all persons participating in the telemedicine service and their role in the encounter: Dr. Mariea Clonts, DO, Kelli [name]   Time spent on call: 85min    Provider:  Curstin Schmale L. Mariea Clonts, D.O., C.M.D.  Goals of Care:  Advanced Directives 10/22/2020  Does [name] Have a Medical Advance Directive? No  Type of Advance Directive -  Does [name] want to make changes to medical advance directive? -  Copy of Kelli [name] in Chart? -  Would [name] like information on creating a medical advance directive? No - [name] declined     Chief Complaint  [name] presents with  . Medical Management of Chronic Issues    4 month follow up    HPI: [name] is a 75 y.o. female seen today for medical management of chronic diseases.    She's doing ok.  She had mentioned getting lost on back roads due to detours.  I'd said that concerned me.  She is now using her GPS.  She has had to go into debt for her oral surgery.  This has been stressful.    She's been on her hormones--gyn also wanted her to cut them down.  Had her quit for a week or two and her symptoms all came back.  She's now been on them every other day.  She has no symptoms.  Suggested now moving to every 3rd day.    She also would like to be gradually taken off bupropion.  She does not feel depressed anymore.  She started playing video games.  The bupropion is very expensive.  We will need to reduce to 150mg  daily when she next needs a refill.    She is resting ok with ambien and xanax.  She is doing better with her cats.  She was very stressed out and not herself the last time she  was here.   It looks like her creams for her psoriasis were sent in today as requested.  She's continuing to try to reduce her sweets intake.  She was eating peanut butter chocolate candy for a while and switched to chocolate chips themselves which she'll have just a little bit of.   Her grandson sent her a full [place] meal that was enough pasta for three days for both Christmas and her bday.  She's at her daughter's now and had spaghetti there.  Eats just one helping of mashed potatoes normally, will freeze them in single helpings.  She is doing some dancing for exercise in the house.  She's less active b/c she'd doing computer games.    Past Medical History:  Diagnosis Date  . Anxiety   . Cataract    BILATERAL REMOVAL  . Chronic kidney disease    KIDNEY STONES 2 TIMES IN 20 YEARS   . Colon polyp    Tubular Adenoma  . Colon polyp, hyperplastic   . DDD (degenerative disc disease), lumbar   . Diverticulosis   . Flushing   . Hyperlipidemia   . Hypertension   . Hypertonicity of bladder   . Insomnia, unspecified   . Internal hemorrhoid   . Other abnormal blood chemistry   .  Sigmoid polyp   . Thyroid disease     Past Surgical History:  Procedure Laterality Date  . Kelli [name]   has bilateral silicone implants  . CHOLECYSTECTOMY    . COLONOSCOPY  11/24/2015   Dr. Hilarie Fredrickson  . Kelli [name]  . POLYPECTOMY    . ROTATOR CUFF REPAIR  2013    No Known Allergies  Outpatient Encounter Medications as of 10/22/2020  Medication Sig  . ALPRAZolam (XANAX) 1 MG tablet TAKE 2 TABLETS BY MOUTH ONCE DAILY AT BEDTIME AS NEEDED FOR ANXIETY  . buPROPion (WELLBUTRIN XL) 300 MG 24 hr tablet Take one tablet by mouth once daily.  . clobetasol cream (TEMOVATE) 6.72 % Apply 1 application topically 2 (two) times daily.  . diclofenac sodium (VOLTAREN) 1 % GEL Apply 2 g topically 4 (four) times daily. To right shoulder for osteoarthritis  . estradiol (ESTRACE) 1  MG tablet Take 1 tablet (1 mg total) by mouth daily.  . fexofenadine (ALLEGRA ALLERGY) 180 MG tablet Take 1 tablet (180 mg total) by mouth daily.  Marland Kitchen ketoconazole (NIZORAL) 2 % cream Apply 1 application topically daily.  Marland Kitchen levothyroxine (SYNTHROID) 88 MCG tablet Take one tablet by mouth once daily 30 minutes before breakfast on empty stomach  . losartan (COZAAR) 25 MG tablet Take 1 tablet (25 mg total) by mouth daily.  . medroxyPROGESTERone (PROVERA) 2.5 MG tablet Take 1 tablet (2.5 mg total) by mouth daily.  . simvastatin (ZOCOR) 40 MG tablet Take one tablet by mouth once daily for cholesterol.  Marland Kitchen tiZANidine (ZANAFLEX) 4 MG capsule Take 1 capsule (4 mg total) by mouth daily as needed for muscle spasms.  Marland Kitchen zolpidem (AMBIEN) 5 MG tablet TAKE 1 TABLET BY MOUTH AT BEDTIME   No facility-administered encounter medications on file as of 10/22/2020.    Review of Systems:  Review of Systems  Constitutional: Negative for chills, fever and malaise/fatigue.  HENT: Negative for congestion, hearing loss and sore throat.   Eyes: Negative for blurred vision.  Respiratory: Negative for cough and shortness of breath.   Cardiovascular: Negative for chest pain, palpitations and leg swelling.  Gastrointestinal: Negative for abdominal pain and constipation.  Genitourinary: Negative for dysuria.  Musculoskeletal: Negative for back pain, falls and joint pain.  Skin: Negative for itching and rash.  Neurological: Negative for dizziness and loss of consciousness.  Psychiatric/Behavioral: Negative for depression and memory loss. The [name] is nervous/anxious and has insomnia.     Health Maintenance  Topic Date Due  . TETANUS/TDAP  10/03/2018  . COLONOSCOPY (Pts 45-79yrs Insurance coverage will need to be confirmed)  08/01/2022  . INFLUENZA VACCINE  Completed  . DEXA SCAN  Completed  . COVID-19 Vaccine  Completed  . Hepatitis C Screening  Completed  . PNA vac Low Risk Adult  Completed    Physical  Exam: Could not be performed as visit non face-to-face via phone   Labs reviewed: Basic Metabolic Panel: Recent Labs    01/06/20 1102 05/14/20 1550 05/14/20 1559 09/30/20 0000  NA 137 138  --  138  K 4.3 4.5  --  3.9  CL 103 103  --  107  CO2 27 29  --  23  GLUCOSE 119* 105*  --  116*  BUN 16 20  --  13  CREATININE 0.76 0.90  --  0.87  CALCIUM 9.4 9.9  --  9.1  TSH  --   --  2.64  --  Liver Function Tests: Recent Labs    11/28/19 1810  AST 17  ALT 15  ALKPHOS 53  BILITOT 0.4  PROT 7.5  ALBUMIN 3.8   Recent Labs    11/28/19 1810  LIPASE 32   No results for input(s): AMMONIA in the last 8760 hours. CBC: Recent Labs    11/28/19 1810 05/14/20 1550  WBC 11.5* 6.3  NEUTROABS 9.2* 3,490  HGB 13.1 13.4  HCT 37.5 40.2  MCV 92.6 92.8  PLT 219 231   Lipid Panel: Recent Labs    01/06/20 1102 09/30/20 0000  CHOL 153 157  HDL 48* 57  LDLCALC 84 82  TRIG 110 86  CHOLHDL 3.2 2.8   Lab Results  Component Value Date   HGBA1C 5.8 (H) 09/30/2020    Assessment/Plan 1. Anxiety and depression -stable with current regimen and cats and daughter for support, cont same and monitor -wants to wean wellbutrin when next due for refill so dose revised in meds  2. Prediabetes -encouraged return to regular exercise, working on low carb diet Lab Results  Component Value Date   HGBA1C 5.8 (H) 09/30/2020   - Hemoglobin A1c; Future  3. Essential hypertension, benign -bp at goal with current regimen, cont same and monitor - BASIC METABOLIC PANEL WITH GFR; Future - CBC with Differential/Platelet; Future  4. Mixed hyperlipidemia -Continue current Zocor and follow-up in about 6 months to a year on lipid panel  5. Psychophysiological insomnia -Does okay with Ambien and have been unable to wean her from her Xanax though I have tried  6. Hormone replacement therapy -We will continue to wean her estrogen and progesterone therapies She had reduced to every other day  and then I have advised that after a month she go down to every third day since she was tolerating every other day okay  7. Psoriasis of scalp -Continue Nizoral cream, clobetasol cream and her ketoconazole shampoo  8. Financial difficulties -Ongoing and creates quite a bit of stress for her due to some debts she has -Still wants to try to reduce her Wellbutrin to 150 and is due for renewal  Labs/tests ordered:   Lab Orders     Hemoglobin A1c     BASIC METABOLIC PANEL WITH GFR     CBC with Differential/Platelet  Next appt:  10/22/2020 Non face-to-face time spent on televisit:  35 mins  Kabeer Hoagland L. Zeynab Klett, D.O. [facility] [address], [place] [address] Cell Phone (Mon-Fri 8am-5pm):  [phone] On Call:  [phone] & follow prompts after 5pm & weekends Office Phone:  [phone] Office Fax:  [phone]

## 2020-10-22 NOTE — Patient Instructions (Signed)
Try reducing your hormones to every third day for the next month.    Try to avoid the peanut butter cups.  Also be careful not to eat too much pasta.    We can reduce your bupropion to 150mg  when you next need a refill.

## 2020-10-22 NOTE — Telephone Encounter (Signed)
Ms. margree, gimbel are scheduled for a virtual visit with your provider today.    Just as we do with appointments in the office, we must obtain your consent to participate.  Your consent will be active for this visit and any virtual visit you may have with one of our providers in the next 365 days.    If you have a MyChart account, I can also send a copy of this consent to you electronically.  All virtual visits are billed to your insurance company just like a traditional visit in the office.  As this is a virtual visit, video technology does not allow for your provider to perform a traditional examination.  This may limit your provider's ability to fully assess your condition.  If your provider identifies any concerns that need to be evaluated in person or the need to arrange testing such as labs, EKG, etc, we will make arrangements to do so.    Although advances in technology are sophisticated, we cannot ensure that it will always work on either your end or our end.  If the connection with a video visit is poor, we may have to switch to a telephone visit.  With either a video or telephone visit, we are not always able to ensure that we have a secure connection.   I need to obtain your verbal consent now.   Are you willing to proceed with your visit today?   [place] L Mizrachi has provided verbal consent on 10/22/2020 for a virtual visit (video or telephone).   Tanna Savoy, [place] 10/22/2020  3:55 PM

## 2020-10-30 ENCOUNTER — Other Ambulatory Visit: Payer: Self-pay | Admitting: *Deleted

## 2020-10-30 NOTE — Patient Outreach (Signed)
[facility]) Care Management  10/30/2020  Kelli [name] June 10, 1946 [id number]  Multiple attempts to establish contact with patient without success. No response from unsuccessful letter mailed to patient. Case is being closed at this time.   Plan: RN Health Coach will close case.  [name] RN, BSN [place] [phone] [email or web address]

## 2020-11-19 ENCOUNTER — Telehealth: Payer: Self-pay | Admitting: *Deleted

## 2020-11-19 NOTE — Telephone Encounter (Signed)
Yes, if she also practices social distancing and good handwashing.

## 2020-11-19 NOTE — Telephone Encounter (Signed)
Patient called and wanted to know if she wears a N95 mask if she could go back to the gym.  Please Advise.

## 2020-11-20 NOTE — Telephone Encounter (Signed)
Patient notified and agreed.  

## 2020-11-23 ENCOUNTER — Encounter: Payer: Self-pay | Admitting: Internal Medicine

## 2020-12-02 DIAGNOSIS — M545 Low back pain, unspecified: Secondary | ICD-10-CM | POA: Diagnosis not present

## 2020-12-09 ENCOUNTER — Telehealth: Payer: Self-pay

## 2020-12-09 DIAGNOSIS — F32A Depression, unspecified: Secondary | ICD-10-CM

## 2020-12-09 DIAGNOSIS — F5104 Psychophysiologic insomnia: Secondary | ICD-10-CM

## 2020-12-09 DIAGNOSIS — F419 Anxiety disorder, unspecified: Secondary | ICD-10-CM

## 2020-12-09 MED ORDER — ZOLPIDEM TARTRATE 5 MG PO TABS: 5.0000 mg | ORAL_TABLET | Freq: Every day | ORAL | 1 refills | Status: DC

## 2020-12-09 MED ORDER — ALPRAZOLAM 1 MG PO TABS
ORAL_TABLET | ORAL | 1 refills | Status: DC
Start: 2020-12-09 — End: 2021-02-10

## 2020-12-09 NOTE — Telephone Encounter (Signed)
Incoming call received from patient requesting refill request for alprazolam and zolpidem [facility] [address]  Reason for pharmacy change: [facility] miles away   Both rx's last filled on 07/13/2020 for 30 day supply with 5 refills. Treatment agreement will need to be updated at pending appointment in April 2022 to reflect patients request to change pharmacies (notation made on pending appointment)

## 2020-12-09 NOTE — Telephone Encounter (Signed)
RX's were previously pended and will be sent back to Dr.Reed as they are controlled substances and need to be approved by a provider

## 2020-12-09 NOTE — Telephone Encounter (Signed)
Noted.  This is ok.  I've tried to wean these for her unsuccessfully.

## 2020-12-10 ENCOUNTER — Telehealth: Payer: Self-pay | Admitting: Nurse Practitioner

## 2020-12-10 NOTE — Telephone Encounter (Signed)
Called to schedule AWV. Left vm.

## 2020-12-17 ENCOUNTER — Telehealth: Payer: Self-pay

## 2020-12-17 ENCOUNTER — Telehealth: Payer: Self-pay | Admitting: *Deleted

## 2020-12-17 ENCOUNTER — Other Ambulatory Visit: Payer: Self-pay

## 2020-12-17 ENCOUNTER — Encounter: Payer: Medicare Other | Admitting: Nurse Practitioner

## 2020-12-17 DIAGNOSIS — F419 Anxiety disorder, unspecified: Secondary | ICD-10-CM

## 2020-12-17 NOTE — Telephone Encounter (Signed)
I made 3 unsuccessful attempts to contact patient to conduct annual wellness visit. Patient's phone went straight to voicemail. I left voicemail on each attempt stating on 3rd and final attempt that she soul need to call to reschedule appointment.  1st attempt-2:09 pm 2nd attempt-2:20 pm 3rd attempt-2:46 pm

## 2020-12-17 NOTE — Telephone Encounter (Signed)
Patient notified and agreed.  Will discuss further at appointment on 01/11/2021  Would you please place the Wellbutrin Rx. I do not see a choice for Wellbutrin XL 100mg .  Or do you just want the plain.

## 2020-12-17 NOTE — Telephone Encounter (Signed)
Patient called back and stated that she spoke with the [facility] [facility] and they told her that she can get Wellbutrin 150mg  #15 for $10.90.  So she is wanting to know if she could do that and take 1/2 tablet per day to get the month's worth for cheaper.   Please Advise.

## 2020-12-17 NOTE — Telephone Encounter (Signed)
Patient stated that she spoke with the pharmacy and stated that if you reduced the Wellbutrin to 100mg  and to take 1/2 tablet a day it would cost $10.90.   Patient is also wanting to know if she can Discontinue the Estradiol and the MedroxyProgesterone.   Please Advise.

## 2020-12-17 NOTE — Telephone Encounter (Signed)
Patient called and stated that she is currently taking Bupropion 300mg . Patient is wanting to come off of it because it has gone up to $126/month.  Patient stated that she is not depressed and doesn't think she needs it anymore. Stated that she knew she couldn't just stop it.  Please Advise.

## 2020-12-17 NOTE — Telephone Encounter (Signed)
Decrease wellbutrin to 100mg  daily--please send in for her She cannot just stop the hormones--they need to be gradually weaned.

## 2020-12-20 MED ORDER — BUPROPION HCL ER (XL) 150 MG PO TB24: 150.0000 mg | ORAL_TABLET | Freq: Every day | ORAL | 0 refills | Status: DC

## 2020-12-20 NOTE — Telephone Encounter (Signed)
I [facility]  She has multiple pharmacies on her list so I hope that was correct.  Please clarify which pharmacies she still uses and remove the others.  She cannot split the 300mg  XL wellbutrins b/c they are extended release.

## 2020-12-21 NOTE — Telephone Encounter (Signed)
Patient notified and agreed.  

## 2020-12-30 ENCOUNTER — Other Ambulatory Visit: Payer: Self-pay | Admitting: Internal Medicine

## 2021-01-11 ENCOUNTER — Ambulatory Visit: Payer: Self-pay | Admitting: Internal Medicine

## 2021-01-14 DIAGNOSIS — Z23 Encounter for immunization: Secondary | ICD-10-CM | POA: Diagnosis not present

## 2021-01-17 ENCOUNTER — Other Ambulatory Visit: Payer: Self-pay | Admitting: Internal Medicine

## 2021-01-17 DIAGNOSIS — F419 Anxiety disorder, unspecified: Secondary | ICD-10-CM

## 2021-01-22 ENCOUNTER — Other Ambulatory Visit: Payer: Self-pay | Admitting: *Deleted

## 2021-01-22 MED ORDER — LOSARTAN POTASSIUM 25 MG PO TABS: 25.0000 mg | ORAL_TABLET | Freq: Every day | ORAL | 1 refills | Status: DC

## 2021-01-22 NOTE — Telephone Encounter (Signed)
Pharmacy requested refill

## 2021-01-26 ENCOUNTER — Ambulatory Visit: Payer: Medicare Other | Admitting: Family Medicine

## 2021-02-10 ENCOUNTER — Other Ambulatory Visit: Payer: Self-pay | Admitting: *Deleted

## 2021-02-10 DIAGNOSIS — F32A Depression, unspecified: Secondary | ICD-10-CM

## 2021-02-10 DIAGNOSIS — F5104 Psychophysiologic insomnia: Secondary | ICD-10-CM

## 2021-02-10 DIAGNOSIS — F419 Anxiety disorder, unspecified: Secondary | ICD-10-CM

## 2021-02-10 MED ORDER — ZOLPIDEM TARTRATE 5 MG PO TABS
5.0000 mg | ORAL_TABLET | Freq: Every day | ORAL | 1 refills | Status: DC
Start: 2021-02-10 — End: 2021-04-12

## 2021-02-10 MED ORDER — ALPRAZOLAM 1 MG PO TABS: ORAL_TABLET | ORAL | 1 refills | Status: DC
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
## 2021-02-23 ENCOUNTER — Ambulatory Visit: Payer: Medicare Other | Admitting: Family Medicine

## 2021-03-09 ENCOUNTER — Encounter: Payer: Self-pay | Admitting: Family Medicine

## 2021-03-09 ENCOUNTER — Ambulatory Visit (INDEPENDENT_AMBULATORY_CARE_PROVIDER_SITE_OTHER): Payer: Medicare Other | Admitting: Family Medicine

## 2021-03-09 ENCOUNTER — Other Ambulatory Visit: Payer: Self-pay

## 2021-03-09 VITALS — BP 122/58 | HR 73 | Temp 97.3°F | Ht 59.0 in | Wt 148.6 lb

## 2021-03-09 DIAGNOSIS — F32A Depression, unspecified: Secondary | ICD-10-CM | POA: Diagnosis not present

## 2021-03-09 DIAGNOSIS — M810 Age-related osteoporosis without current pathological fracture: Secondary | ICD-10-CM | POA: Diagnosis not present

## 2021-03-09 DIAGNOSIS — E039 Hypothyroidism, unspecified: Secondary | ICD-10-CM | POA: Diagnosis not present

## 2021-03-09 DIAGNOSIS — R7303 Prediabetes: Secondary | ICD-10-CM | POA: Diagnosis not present

## 2021-03-09 DIAGNOSIS — I10 Essential (primary) hypertension: Secondary | ICD-10-CM

## 2021-03-09 DIAGNOSIS — F419 Anxiety disorder, unspecified: Secondary | ICD-10-CM

## 2021-03-09 NOTE — Patient Instructions (Signed)
Continue to watch carbs and exercise

## 2021-03-09 NOTE — Progress Notes (Signed)
Provider:  Alain Honey, MD  Careteam: Patient Care Team: Wardell Honour, MD as PCP - General (Family Medicine)  PLACE OF SERVICE:  [place] Directive information    No Known Allergies  Chief Complaint  Patient presents with  . Medical Management of Chronic Issues    Patient presents today for a medication follow-up and would like to lower the dose on her wellbutrin.     HPI: Patient is a 75 y.o. female patient is here to follow-up with medicines.  She is interested in tapering off bupropion and we discussed a plan for doing that.  Same is true of her hormones which she takes estrogen and progesterone.  Plan to taper that as well She continues to exercise regularly but has gained weight since she stopped wearing her dentures and now they do not seem to fit so she eats "comfort" foods which cause her to gain weight.  Review of Systems:  Review of Systems  Constitutional: Negative.   Respiratory: Negative.   Cardiovascular: Negative.   Neurological: Negative.   All other systems reviewed and are negative.   Past Medical History:  Diagnosis Date  . Anxiety   . Cataract    BILATERAL REMOVAL  . Chronic kidney disease    KIDNEY STONES 2 TIMES IN 20 YEARS   . Colon polyp    Tubular Adenoma  . Colon polyp, hyperplastic   . DDD (degenerative disc disease), lumbar   . Diverticulosis   . Flushing   . Hyperlipidemia   . Hypertension   . Hypertonicity of bladder   . Insomnia, unspecified   . Internal hemorrhoid   . Other abnormal blood chemistry   . Sigmoid polyp   . Thyroid disease    Past Surgical History:  Procedure Laterality Date  . [place]   has bilateral silicone implants  . CHOLECYSTECTOMY    . COLONOSCOPY  11/24/2015   Dr. Hilarie Fredrickson  . [place]  . POLYPECTOMY    . ROTATOR CUFF REPAIR  2013   Social History:   reports that she quit smoking about 25 years ago. She has never used smokeless  tobacco. She reports that she does not drink alcohol and does not use drugs.  Family History  Problem Relation Age of Onset  . Cancer Mother        lung  . Colon polyps Mother   . Stroke Father   . Colon cancer Neg Hx   . Rectal cancer Neg Hx   . Stomach cancer Neg Hx   . Esophageal cancer Neg Hx     Medications: Patient's Medications  New Prescriptions   No medications on file  Previous Medications   ALPRAZOLAM (XANAX) 1 MG TABLET    Take 2 tablets by mouth once daily at bedtime as needed for anxiety.   BUPROPION (WELLBUTRIN XL) 150 MG 24 HR TABLET    TAKE 1 TABLET BY MOUTH EVERY DAY   CLOBETASOL CREAM (TEMOVATE) 0.05 %    Apply 1 application topically 2 (two) times daily.   DICLOFENAC SODIUM (VOLTAREN) 1 % GEL    Apply 2 g topically 4 (four) times daily. To right shoulder for osteoarthritis   ESTRADIOL (ESTRACE) 1 MG TABLET    Take 1 tablet (1 mg total) by mouth daily.   FEXOFENADINE (ALLEGRA ALLERGY) 180 MG TABLET    Take 1 tablet (180 mg total) by mouth daily.   KETOCONAZOLE (NIZORAL) 2 % CREAM  Apply 1 application topically daily.   LEVOTHYROXINE (SYNTHROID) 88 MCG TABLET    Take one tablet by mouth once daily 30 minutes before breakfast on empty stomach   LOSARTAN (COZAAR) 25 MG TABLET    Take 1 tablet (25 mg total) by mouth daily.   MEDROXYPROGESTERONE (PROVERA) 2.5 MG TABLET    TAKE 1 TABLET BY MOUTH EVERY DAY   SIMVASTATIN (ZOCOR) 40 MG TABLET    TAKE 1 TABLET EVERY DAY FOR CHOLESTEROL   TIZANIDINE (ZANAFLEX) 4 MG CAPSULE    Take 1 capsule (4 mg total) by mouth daily as needed for muscle spasms.   ZOLPIDEM (AMBIEN) 5 MG TABLET    Take 1 tablet (5 mg total) by mouth at bedtime.  Modified Medications   No medications on file  Discontinued Medications   No medications on file    Physical Exam:  Vitals:   03/09/21 1445  BP: (!) 122/58  Pulse: 73  Temp: (!) 97.3 F (36.3 C)  SpO2: 98%  Weight: 148 lb 9.6 oz (67.4 kg)  Height: 4\' 11"  (1.499 m)   Body mass index  is 30.01 kg/m. Wt Readings from Last 3 Encounters:  03/09/21 148 lb 9.6 oz (67.4 kg)  05/14/20 135 lb 12.8 oz (61.6 kg)  01/09/20 137 lb 9.6 oz (62.4 kg)    Physical Exam Vitals and nursing note reviewed.  Cardiovascular:     Rate and Rhythm: Normal rate and regular rhythm.  Pulmonary:     Effort: Pulmonary effort is normal.     Breath sounds: Normal breath sounds.  Abdominal:     General: Abdomen is flat.     Palpations: Abdomen is soft.  Neurological:     General: No focal deficit present.     Mental Status: She is alert and oriented to person, place, and time.     Labs reviewed: Basic Metabolic Panel: Recent Labs    05/14/20 1550 05/14/20 1559 09/30/20 0000  NA 138  --  138  K 4.5  --  3.9  CL 103  --  107  CO2 29  --  23  GLUCOSE 105*  --  116*  BUN 20  --  13  CREATININE 0.90  --  0.87  CALCIUM 9.9  --  9.1  TSH  --  2.64  --    Liver Function Tests: No results for input(s): AST, ALT, ALKPHOS, BILITOT, PROT, ALBUMIN in the last 8760 hours. No results for input(s): LIPASE, AMYLASE in the last 8760 hours. No results for input(s): AMMONIA in the last 8760 hours. CBC: Recent Labs    05/14/20 1550  WBC 6.3  NEUTROABS 3,490  HGB 13.4  HCT 40.2  MCV 92.8  PLT 231   Lipid Panel: Recent Labs    09/30/20 0000  CHOL 157  HDL 57  LDLCALC 82  TRIG 86  CHOLHDL 2.8   TSH: Recent Labs    05/14/20 1559  TSH 2.64   A1C: Lab Results  Component Value Date   HGBA1C 5.8 (H) 09/30/2020    ASESSMENT: 1. Essential hypertension, benign Blood pressure is good at 122/58.  Continue on losartan 25 mg but suggested she might break it in half and take it twice daily to get more even coverage  2. Hypothyroidism, unspecified type Patient continues with levothyroxine.  Last TSH done in August 2021 was 2.64 need to recheck on annual basis  3. Osteoporosis without current pathological fracture, unspecified osteoporosis type Not interested in an DEXA scan.  She  has been taking hormones up till now.  She promised to take some calcium and vitamin D  4. Anxiety and depression Several life stressors.  She takes Xanax at bedtime and as needed we will plan on discontinuing bupropion and taper fashion  5. Prediabetes Last A1c was 5.8 which is just slightly above normal and probably consistent with prediabetes.  We will continue to check on an annual basis.  Stressed the need of weight loss and watching carbohydrate intake    Alain Honey, MD [place] [phone]

## 2021-03-12 ENCOUNTER — Telehealth: Payer: Self-pay | Admitting: *Deleted

## 2021-03-12 NOTE — Telephone Encounter (Signed)
LMOM to return call.

## 2021-03-12 NOTE — Telephone Encounter (Signed)
Patient returned call while I was in clinical intake and patient informed of Dani Gobble recommendation to take table once a day. Patient verbalized her understanding and agreed.

## 2021-03-12 NOTE — Telephone Encounter (Signed)
Patient called and stated that Dr. Sabra Heck recommended for her to cut her Losartan in half and take 1/2 am and 1/2 pm Patient stated that the pills are so small that 1/2 of it crumbles when she does this.   Patient is wondering if Losartan could be called in where she could take one pill in the am and one pill in the pm.   Please Advise. (Forwarded to [place] due to Dr. Sabra Heck out of office.)    ASESSMENT: 1. Essential hypertension, benign Blood pressure is good at 122/58.  Continue on losartan 25 mg but suggested she might break it in half and take it twice daily to get more even coverage

## 2021-03-19 ENCOUNTER — Other Ambulatory Visit: Payer: Self-pay | Admitting: *Deleted

## 2021-03-19 MED ORDER — LEVOTHYROXINE SODIUM 88 MCG PO TABS: ORAL_TABLET | ORAL | 1 refills | Status: DC

## 2021-03-19 NOTE — Telephone Encounter (Signed)
Patient requested refill

## 2021-04-11 ENCOUNTER — Other Ambulatory Visit: Payer: Self-pay | Admitting: Family Medicine

## 2021-04-11 DIAGNOSIS — F5104 Psychophysiologic insomnia: Secondary | ICD-10-CM

## 2021-04-11 DIAGNOSIS — F419 Anxiety disorder, unspecified: Secondary | ICD-10-CM

## 2021-04-11 DIAGNOSIS — F32A Depression, unspecified: Secondary | ICD-10-CM

## 2021-04-12 NOTE — Telephone Encounter (Signed)
Patient has request refill on medication "Alprazolam", and "Zolpidem". Patient last refills were 02/10/21 for Alprazolam, and Zolpidem. Medication pend and sent to Sherrie Mustache, NP due to PCP Sabra Heck Lillette Boxer, MD being out of office. Please Advise.

## 2021-04-16 ENCOUNTER — Telehealth: Payer: Self-pay

## 2021-04-16 NOTE — Telephone Encounter (Signed)
Ms. Colello called regarding her daughter, Kelli [name] and questions about her lab results. I informed the patient that I was unable to give her any information regarding her daughter because she is not listed to release information to. She also wanted to know if she would have to have a drug screen when she comes in for her appointment with Dr. Alain Honey, I informed her that that would be up to the provider if he wants to order that or not. I also told her that if [facility] had any questions she could schedule an appointment if she would like to discuss the matter. She verbalized her understanding and stated that she would speak with daughter and relay the information and that she would possibly call back on Monday

## 2021-05-11 ENCOUNTER — Ambulatory Visit: Payer: Medicare Other | Admitting: Family Medicine

## 2021-05-18 ENCOUNTER — Ambulatory Visit: Payer: Medicare Other | Admitting: Family Medicine

## 2021-05-19 ENCOUNTER — Other Ambulatory Visit: Payer: Self-pay | Admitting: Family Medicine

## 2021-05-20 ENCOUNTER — Other Ambulatory Visit: Payer: Self-pay | Admitting: *Deleted

## 2021-05-20 MED ORDER — LOSARTAN POTASSIUM 25 MG PO TABS: 25.0000 mg | ORAL_TABLET | Freq: Every day | ORAL | 1 refills | Status: DC

## 2021-05-20 NOTE — Telephone Encounter (Signed)
Patient requested refill

## 2021-06-11 ENCOUNTER — Other Ambulatory Visit: Payer: Self-pay | Admitting: Nurse Practitioner

## 2021-06-11 DIAGNOSIS — F5104 Psychophysiologic insomnia: Secondary | ICD-10-CM

## 2021-06-11 DIAGNOSIS — F419 Anxiety disorder, unspecified: Secondary | ICD-10-CM

## 2021-06-11 DIAGNOSIS — F32A Depression, unspecified: Secondary | ICD-10-CM

## 2021-06-11 DIAGNOSIS — Z23 Encounter for immunization: Secondary | ICD-10-CM | POA: Diagnosis not present

## 2021-06-11 NOTE — Telephone Encounter (Signed)
Both rx's last filled on 04/12/21, no treatment agreement on file, notation made on pending appointment

## 2021-06-15 ENCOUNTER — Ambulatory Visit: Payer: Medicare Other | Admitting: Family Medicine

## 2021-06-23 ENCOUNTER — Other Ambulatory Visit: Payer: Self-pay | Admitting: *Deleted

## 2021-06-23 MED ORDER — ESTRADIOL 1 MG PO TABS: 1.0000 mg | ORAL_TABLET | Freq: Every day | ORAL | 1 refills | Status: DC

## 2021-06-23 NOTE — Telephone Encounter (Signed)
Patient requested refill

## 2021-07-05 ENCOUNTER — Other Ambulatory Visit: Payer: Self-pay | Admitting: Family Medicine

## 2021-07-06 ENCOUNTER — Ambulatory Visit: Payer: Medicare Other | Admitting: Family Medicine

## 2021-07-08 ENCOUNTER — Ambulatory Visit: Payer: Medicare Other

## 2021-07-15 ENCOUNTER — Telehealth: Payer: Self-pay

## 2021-07-15 NOTE — Telephone Encounter (Signed)
Patient states she has a cold: No energy, stuffy, sneezing and voice change.   Symptoms started Monday am. Took covid at home tests Tuesday and Wed. Both negative.   Patient states it doesn't seem to be her normal allergies. Both Alka-seltzer and sudafed were no help.   [facility] [facility]  To [employer] [facility] [employer]

## 2021-07-16 NOTE — Telephone Encounter (Signed)
LMOM to return call to schedule.  

## 2021-07-19 DIAGNOSIS — Z20822 Contact with and (suspected) exposure to covid-19: Secondary | ICD-10-CM | POA: Diagnosis not present

## 2021-07-21 ENCOUNTER — Telehealth: Payer: Self-pay

## 2021-07-21 ENCOUNTER — Other Ambulatory Visit: Payer: Self-pay | Admitting: *Deleted

## 2021-07-21 DIAGNOSIS — R7303 Prediabetes: Secondary | ICD-10-CM

## 2021-07-21 DIAGNOSIS — E039 Hypothyroidism, unspecified: Secondary | ICD-10-CM

## 2021-07-21 DIAGNOSIS — I10 Essential (primary) hypertension: Secondary | ICD-10-CM

## 2021-07-21 DIAGNOSIS — E782 Mixed hyperlipidemia: Secondary | ICD-10-CM

## 2021-07-21 NOTE — Telephone Encounter (Signed)
Patient would like to know if she is due for labs and if she will be able to get them on her upcoming appointment. She states that it has been a while since she's had labs.I see there is an order in that expires 07/22/21 patient is scheduled for appointment 07/27/21. Orders are under Dr. Magdalene Molly name and need to be place under Dr. Palma Holter name. Please advise and place orders.  Message routed to Dr. Alain Honey

## 2021-07-27 ENCOUNTER — Ambulatory Visit (INDEPENDENT_AMBULATORY_CARE_PROVIDER_SITE_OTHER): Payer: Medicare Other | Admitting: Family Medicine

## 2021-07-27 ENCOUNTER — Other Ambulatory Visit: Payer: Self-pay

## 2021-07-27 ENCOUNTER — Encounter: Payer: Self-pay | Admitting: Family Medicine

## 2021-07-27 VITALS — BP 156/86 | HR 78 | Temp 97.9°F | Ht 59.0 in | Wt 150.0 lb

## 2021-07-27 DIAGNOSIS — F419 Anxiety disorder, unspecified: Secondary | ICD-10-CM

## 2021-07-27 DIAGNOSIS — M25561 Pain in right knee: Secondary | ICD-10-CM | POA: Diagnosis not present

## 2021-07-27 DIAGNOSIS — F32A Depression, unspecified: Secondary | ICD-10-CM | POA: Diagnosis not present

## 2021-07-27 DIAGNOSIS — Z23 Encounter for immunization: Secondary | ICD-10-CM

## 2021-07-27 DIAGNOSIS — E7841 Elevated Lipoprotein(a): Secondary | ICD-10-CM

## 2021-07-27 NOTE — Progress Notes (Signed)
Provider:  Alain Honey, MD  Careteam: Patient Care Team: Wardell Honour, MD as PCP - General (Family Medicine)  PLACE OF SERVICE:  [place] Directive information    No Known Allergies  Chief Complaint  Patient presents with   Medical Management of Chronic Issues    Patient returns to the office for follow up.    Quality Metric Gaps    TDAP, #5 covid, Flu shot     HPI: Patient is a 75 y.o. female Four month visit. Only compaint is R knee pain Stiff in AM and hurts more at day's end Has tapered off Welbutrin but continues hormones three days/week..   Review of Systems:  Review of Systems  Constitutional: Negative.   Respiratory: Negative.    Cardiovascular: Negative.   Musculoskeletal:  Positive for joint pain.  All other systems reviewed and are negative.  Past Medical History:  Diagnosis Date   Anxiety    Cataract    BILATERAL REMOVAL   Chronic kidney disease    KIDNEY STONES 2 TIMES IN 20 YEARS    Colon polyp    Tubular Adenoma   Colon polyp, hyperplastic    DDD (degenerative disc disease), lumbar    Diverticulosis    Flushing    Hyperlipidemia    Hypertension    Hypertonicity of bladder    Insomnia, unspecified    Internal hemorrhoid    Other abnormal blood chemistry    Sigmoid polyp    Thyroid disease    Past Surgical History:  Procedure Laterality Date   BREAST ENHANCEMENT SURGERY  1969   has bilateral silicone implants   CHOLECYSTECTOMY     COLONOSCOPY  11/24/2015   Dr. Hilarie Fredrickson   ECTOPIC PREGNANCY SURGERY  1965   POLYPECTOMY     ROTATOR CUFF REPAIR  2013   Social History:   reports that she quit smoking about 25 years ago. Her smoking use included cigarettes. She has never used smokeless tobacco. She reports that she does not drink alcohol and does not use drugs.  Family History  Problem Relation Age of Onset   Cancer Mother        lung   Colon polyps Mother    Stroke Father    Colon cancer Neg Hx    Rectal cancer  Neg Hx    Stomach cancer Neg Hx    Esophageal cancer Neg Hx     Medications: Patient's Medications  New Prescriptions   No medications on file  Previous Medications   ALPRAZOLAM (XANAX) 1 MG TABLET    TAKE 2 TABLETS BY MOUTH AT BEDTIME AS NEEDED FOR ANXIETY   DICLOFENAC SODIUM (VOLTAREN) 1 % GEL    Apply 2 g topically 4 (four) times daily. To right shoulder for osteoarthritis   ESTRADIOL (ESTRACE) 1 MG TABLET    Take 1 tablet (1 mg total) by mouth daily.   LEVOTHYROXINE (SYNTHROID) 88 MCG TABLET    Take one tablet by mouth once daily 30 minutes before breakfast on empty stomach   LOSARTAN (COZAAR) 25 MG TABLET    Take 1 tablet (25 mg total) by mouth daily.   MEDROXYPROGESTERONE (PROVERA) 2.5 MG TABLET    TAKE 1 TABLET BY MOUTH EVERY DAY   SIMVASTATIN (ZOCOR) 40 MG TABLET    TAKE 1 TABLET EVERY DAY FOR CHOLESTEROL   ZOLPIDEM (AMBIEN) 5 MG TABLET    TAKE 1 TABLET BY MOUTH EVERYDAY AT BEDTIME  Modified Medications   No medications on  file  Discontinued Medications   BUPROPION (WELLBUTRIN XL) 150 MG 24 HR TABLET    TAKE 1 TABLET BY MOUTH EVERY DAY   CLOBETASOL CREAM (TEMOVATE) 0.05 %    Apply 1 application topically 2 (two) times daily.   FEXOFENADINE (ALLEGRA ALLERGY) 180 MG TABLET    Take 1 tablet (180 mg total) by mouth daily.   KETOCONAZOLE (NIZORAL) 2 % CREAM    Apply 1 application topically daily.   TIZANIDINE (ZANAFLEX) 4 MG CAPSULE    Take 1 capsule (4 mg total) by mouth daily as needed for muscle spasms.    Physical Exam:  Vitals:   07/27/21 1408  BP: (!) 156/86  Pulse: 78  Temp: 97.9 F (36.6 C)  SpO2: 97%  Weight: 150 lb (68 kg)  Height: 4\' 11"  (1.499 m)   Body mass index is 30.3 kg/m. Wt Readings from Last 3 Encounters:  07/27/21 150 lb (68 kg)  03/09/21 148 lb 9.6 oz (67.4 kg)  05/14/20 135 lb 12.8 oz (61.6 kg)    Physical Exam Vitals and nursing note reviewed.  Constitutional:      Appearance: Normal appearance.  Cardiovascular:     Rate and Rhythm:  Normal rate and regular rhythm.  Pulmonary:     Effort: Pulmonary effort is normal.     Breath sounds: Normal breath sounds.  Musculoskeletal:     Comments: R Knee: some crepitance; stable to stress; no joint line tenderness  Neurological:     Mental Status: She is alert.    Labs reviewed: Basic Metabolic Panel: Recent Labs    09/30/20 0000  NA 138  K 3.9  CL 107  CO2 23  GLUCOSE 116*  BUN 13  CREATININE 0.87  CALCIUM 9.1   Liver Function Tests: No results for input(s): AST, ALT, ALKPHOS, BILITOT, PROT, ALBUMIN in the last 8760 hours. No results for input(s): LIPASE, AMYLASE in the last 8760 hours. No results for input(s): AMMONIA in the last 8760 hours. CBC: No results for input(s): WBC, NEUTROABS, HGB, HCT, MCV, PLT in the last 8760 hours. Lipid Panel: Recent Labs    09/30/20 0000  CHOL 157  HDL 57  LDLCALC 82  TRIG 86  CHOLHDL 2.8   TSH: No results for input(s): TSH in the last 8760 hours. A1C: Lab Results  Component Value Date   HGBA1C 5.8 (H) 09/30/2020     Assessment/Plan  1. Need for influenza vaccination  - Flu Vaccine QUAD High Dose(Fluad)  2. Anxiety and depression Doing okay off Welbutrin  3. Elevated lipoprotein(a Lipids at goal on statin  4. Acute pain of right knee Likely degenerative; try ibuprofen  Alain Honey, MD [place] [phone]

## 2021-07-27 NOTE — Patient Instructions (Signed)
BP cuff [employer]

## 2021-07-28 DIAGNOSIS — M25561 Pain in right knee: Secondary | ICD-10-CM | POA: Diagnosis not present

## 2021-07-28 DIAGNOSIS — M25569 Pain in unspecified knee: Secondary | ICD-10-CM | POA: Insufficient documentation

## 2021-08-03 DIAGNOSIS — Z20822 Contact with and (suspected) exposure to covid-19: Secondary | ICD-10-CM | POA: Diagnosis not present

## 2021-08-12 ENCOUNTER — Other Ambulatory Visit: Payer: Self-pay | Admitting: Orthopaedic Surgery

## 2021-08-12 DIAGNOSIS — M25561 Pain in right knee: Secondary | ICD-10-CM

## 2021-08-14 ENCOUNTER — Other Ambulatory Visit: Payer: Self-pay | Admitting: Family

## 2021-08-14 ENCOUNTER — Other Ambulatory Visit: Payer: Self-pay | Admitting: Family Medicine

## 2021-08-14 DIAGNOSIS — F419 Anxiety disorder, unspecified: Secondary | ICD-10-CM

## 2021-08-14 DIAGNOSIS — F5104 Psychophysiologic insomnia: Secondary | ICD-10-CM

## 2021-08-16 NOTE — Telephone Encounter (Signed)
Patient has request refill on medication "Alprazolam" and "Ambien". Patient last refill for both medications dated 06/11/2021. Patient has Non Opioid contract on file dated 05/18/2020. Patient needs to update contract. Patient has reminder to update contract in appoointment notes for 09/07/2021. Patient medication pend and sent to PCP Sabra Heck Lillette Boxer, MD for approval.

## 2021-08-18 DIAGNOSIS — M25552 Pain in left hip: Secondary | ICD-10-CM | POA: Diagnosis not present

## 2021-08-18 DIAGNOSIS — M25551 Pain in right hip: Secondary | ICD-10-CM | POA: Diagnosis not present

## 2021-08-18 DIAGNOSIS — M25562 Pain in left knee: Secondary | ICD-10-CM | POA: Diagnosis not present

## 2021-08-25 ENCOUNTER — Other Ambulatory Visit: Payer: Medicare Other

## 2021-08-30 ENCOUNTER — Other Ambulatory Visit: Payer: Self-pay

## 2021-08-30 ENCOUNTER — Other Ambulatory Visit: Payer: Medicare Other

## 2021-08-30 DIAGNOSIS — R7303 Prediabetes: Secondary | ICD-10-CM

## 2021-08-30 DIAGNOSIS — E039 Hypothyroidism, unspecified: Secondary | ICD-10-CM

## 2021-08-30 DIAGNOSIS — E782 Mixed hyperlipidemia: Secondary | ICD-10-CM

## 2021-08-30 DIAGNOSIS — I10 Essential (primary) hypertension: Secondary | ICD-10-CM | POA: Diagnosis not present

## 2021-08-31 ENCOUNTER — Ambulatory Visit
Admission: RE | Admit: 2021-08-31 | Discharge: 2021-08-31 | Disposition: A | Discharge status: Home / Self Care | Payer: Medicare Other | Source: Ambulatory Visit | Attending: Orthopaedic Surgery | Admitting: Orthopaedic Surgery

## 2021-08-31 DIAGNOSIS — M25561 Pain in right knee: Secondary | ICD-10-CM

## 2021-08-31 LAB — COMPLETE METABOLIC PANEL WITH GFR
AG Ratio: 1.3 (calc) (ref 1.0–2.5)
ALT: 16 U/L (ref 6–29)
AST: 19 U/L (ref 10–35)
Albumin: 4 g/dL (ref 3.6–5.1)
Alkaline phosphatase (APISO): 53 U/L (ref 37–153)
BUN/Creatinine Ratio: 48 (calc) — ABNORMAL HIGH (ref 6–22)
BUN: 30 mg/dL — ABNORMAL HIGH (ref 7–25)
CO2: 26 mmol/L (ref 20–32)
Calcium: 9.4 mg/dL (ref 8.6–10.4)
Chloride: 106 mmol/L (ref 98–110)
Creat: 0.62 mg/dL (ref 0.60–1.00)
Globulin: 3.2 g/dL (calc) (ref 1.9–3.7)
Glucose, Bld: 136 mg/dL (ref 65–139)
Potassium: 4 mmol/L (ref 3.5–5.3)
Sodium: 139 mmol/L (ref 135–146)
Total Bilirubin: 0.4 mg/dL (ref 0.2–1.2)
Total Protein: 7.2 g/dL (ref 6.1–8.1)
eGFR: 92 mL/min/{1.73_m2} (ref >=60)

## 2021-08-31 LAB — CBC WITH DIFFERENTIAL/PLATELET
Absolute Monocytes: 470 cells/uL (ref 200–950)
Basophils Absolute: 41 cells/uL (ref 0–200)
Basophils Relative: 0.7 %
Eosinophils Absolute: 29 cells/uL (ref 15–500)
Eosinophils Relative: 0.5 %
HCT: 39.7 % (ref 35.0–45.0)
Hemoglobin: 13.5 g/dL (ref 11.7–15.5)
Lymphs Abs: 2187 cells/uL (ref 850–3900)
MCH: 32.3 pg (ref 27.0–33.0)
MCHC: 34 g/dL (ref 32.0–36.0)
MCV: 95 fL (ref 80.0–100.0)
MPV: 10 fL (ref 7.5–12.5)
Monocytes Relative: 8.1 %
Neutro Abs: 3074 cells/uL (ref 1500–7800)
Neutrophils Relative %: 53 %
Platelets: 248 10*3/uL (ref 140–400)
RBC: 4.18 10*6/uL (ref 3.80–5.10)
RDW: 13.1 % (ref 11.0–15.0)
Total Lymphocyte: 37.7 %
WBC: 5.8 10*3/uL (ref 3.8–10.8)

## 2021-08-31 LAB — HEMOGLOBIN A1C
Hgb A1c MFr Bld: 6.3 % of total Hgb — ABNORMAL HIGH (ref <5.7)
Mean Plasma Glucose: 134 mg/dL
eAG (mmol/L): 7.4 mmol/L

## 2021-08-31 LAB — LIPID PANEL
Cholesterol: 186 mg/dL (ref <200)
HDL: 53 mg/dL (ref >=50)
LDL Cholesterol (Calc): 105 mg/dL (calc) — ABNORMAL HIGH
Non-HDL Cholesterol (Calc): 133 mg/dL (calc) — ABNORMAL HIGH (ref <130)
Total CHOL/HDL Ratio: 3.5 (calc) (ref <5.0)
Triglycerides: 166 mg/dL — ABNORMAL HIGH (ref <150)

## 2021-09-02 DIAGNOSIS — Z20822 Contact with and (suspected) exposure to covid-19: Secondary | ICD-10-CM | POA: Diagnosis not present

## 2021-09-06 DIAGNOSIS — M25562 Pain in left knee: Secondary | ICD-10-CM | POA: Diagnosis not present

## 2021-09-06 DIAGNOSIS — M25561 Pain in right knee: Secondary | ICD-10-CM | POA: Diagnosis not present

## 2021-09-07 ENCOUNTER — Ambulatory Visit: Payer: Medicare Other | Admitting: Family Medicine

## 2021-10-03 DIAGNOSIS — Z20822 Contact with and (suspected) exposure to covid-19: Secondary | ICD-10-CM | POA: Diagnosis not present

## 2021-10-18 ENCOUNTER — Other Ambulatory Visit: Payer: Self-pay | Admitting: Family Medicine

## 2021-10-18 DIAGNOSIS — F32A Depression, unspecified: Secondary | ICD-10-CM

## 2021-10-18 DIAGNOSIS — F419 Anxiety disorder, unspecified: Secondary | ICD-10-CM

## 2021-10-18 DIAGNOSIS — F5104 Psychophysiologic insomnia: Secondary | ICD-10-CM

## 2021-10-18 NOTE — Telephone Encounter (Signed)
Pharmacy requested refill.  Epic LR: 08/16/2021 Contract date: 05/14/2020, added note to upcoming appointment to update.   Pended Rx and sent to [facility] for approval due to Dr. Sabra Heck and Janett Billow out of office.

## 2021-11-02 ENCOUNTER — Encounter: Payer: Self-pay | Admitting: Family Medicine

## 2021-11-02 ENCOUNTER — Other Ambulatory Visit: Payer: Self-pay

## 2021-11-02 ENCOUNTER — Ambulatory Visit (INDEPENDENT_AMBULATORY_CARE_PROVIDER_SITE_OTHER): Payer: Medicare Other | Admitting: Family Medicine

## 2021-11-02 VITALS — BP 144/88 | HR 78 | Temp 97.3°F | Ht 59.0 in | Wt 147.3 lb

## 2021-11-02 DIAGNOSIS — F32A Depression, unspecified: Secondary | ICD-10-CM

## 2021-11-02 DIAGNOSIS — E7841 Elevated Lipoprotein(a): Secondary | ICD-10-CM

## 2021-11-02 DIAGNOSIS — E039 Hypothyroidism, unspecified: Secondary | ICD-10-CM | POA: Diagnosis not present

## 2021-11-02 DIAGNOSIS — I10 Essential (primary) hypertension: Secondary | ICD-10-CM | POA: Diagnosis not present

## 2021-11-02 DIAGNOSIS — F419 Anxiety disorder, unspecified: Secondary | ICD-10-CM | POA: Diagnosis not present

## 2021-11-02 LAB — TSH: TSH: 0.07 mIU/L — ABNORMAL LOW (ref 0.40–4.50)

## 2021-11-02 NOTE — Progress Notes (Signed)
Provider:  Alain Honey, MD  Careteam: Patient Care Team: Wardell Honour, MD as PCP - General (Family Medicine)  PLACE OF SERVICE:  [place] Directive information    No Known Allergies  Chief Complaint  Patient presents with   Medical Management of Chronic Issues    Patient presents today for a 3 month follow up   Quality Metric Gaps    TDAP     HPI: Patient is a 76 y.o. female routine follow-up visit for medical management of chronic problems including blood pressure, anxiety, hypothyroid, and insomnia. Since her last visit here she has seen orthopedics who have done some imaging studies on her right knee.  No surgery is planned.  Patient takes ibuprofen as well as acetaminophen.  We talked about maximum doses of each that she might take during the day. She does endorse some shortness of breath when she walks to the mailbox but she also admits to having been absent from her gym where she used to work out.  I suspect deconditioning is part of the problem.  Review of Systems:  Review of Systems  Constitutional: Negative.   HENT: Negative.    Eyes: Negative.   Respiratory:  Positive for shortness of breath.   Cardiovascular: Negative.   Gastrointestinal: Negative.   Genitourinary: Negative.   Musculoskeletal:  Positive for joint pain.  Neurological: Negative.   Endo/Heme/Allergies: Negative.   Psychiatric/Behavioral:  The patient is nervous/anxious and has insomnia.   All other systems reviewed and are negative.  Past Medical History:  Diagnosis Date   Anxiety    Cataract    BILATERAL REMOVAL   Chronic kidney disease    KIDNEY STONES 2 TIMES IN 20 YEARS    Colon polyp    Tubular Adenoma   Colon polyp, hyperplastic    DDD (degenerative disc disease), lumbar    Diverticulosis    Flushing    Hyperlipidemia    Hypertension    Hypertonicity of bladder    Insomnia, unspecified    Internal hemorrhoid    Other abnormal blood chemistry     Sigmoid polyp    Thyroid disease    Past Surgical History:  Procedure Laterality Date   BREAST ENHANCEMENT SURGERY  1969   has bilateral silicone implants   CHOLECYSTECTOMY     COLONOSCOPY  11/24/2015   Dr. Hilarie Fredrickson   ECTOPIC PREGNANCY SURGERY  1965   POLYPECTOMY     ROTATOR CUFF REPAIR  2013   Social History:   reports that she quit smoking about 25 years ago. Her smoking use included cigarettes. She has never used smokeless tobacco. She reports that she does not drink alcohol and does not use drugs.  Family History  Problem Relation Age of Onset   Cancer Mother        lung   Colon polyps Mother    Stroke Father    Colon cancer Neg Hx    Rectal cancer Neg Hx    Stomach cancer Neg Hx    Esophageal cancer Neg Hx     Medications: Patient's Medications  New Prescriptions   No medications on file  Previous Medications   ALPRAZOLAM (XANAX) 1 MG TABLET    TAKE 2 TABLETS BY MOUTH AT BEDTIME AS NEEDED FOR ANXIETY   DICLOFENAC SODIUM (VOLTAREN) 1 % GEL    Apply 2 g topically 4 (four) times daily. To right shoulder for osteoarthritis   ESTRADIOL (ESTRACE) 1 MG TABLET    Take 1  tablet (1 mg total) by mouth daily.   LEVOTHYROXINE (SYNTHROID) 88 MCG TABLET    TAKE 1 TABLET BY MOUTH EVERY DAY 30 MINS BEFORE BREAKFAST ON AN EMPTY STOMACH   LOSARTAN (COZAAR) 25 MG TABLET    Take 1 tablet (25 mg total) by mouth daily.   MEDROXYPROGESTERONE (PROVERA) 2.5 MG TABLET    TAKE 1 TABLET BY MOUTH EVERY DAY   SIMVASTATIN (ZOCOR) 40 MG TABLET    TAKE 1 TABLET EVERY DAY FOR CHOLESTEROL   ZOLPIDEM (AMBIEN) 5 MG TABLET    TAKE 1 TABLET BY MOUTH EVERYDAY AT BEDTIME  Modified Medications   No medications on file  Discontinued Medications   No medications on file    Physical Exam:  Vitals:   11/02/21 1451  BP: (!) 144/88  Pulse: 78  Temp: (!) 97.3 F (36.3 C)  SpO2: 97%  Weight: 147 lb 4.8 oz (66.8 kg)  Height: 4\' 11"  (1.499 m)   Body mass index is 29.75 kg/m. Wt Readings from Last 3  Encounters:  11/02/21 147 lb 4.8 oz (66.8 kg)  07/27/21 150 lb (68 kg)  03/09/21 148 lb 9.6 oz (67.4 kg)    Physical Exam Vitals and nursing note reviewed.  Constitutional:      Appearance: Normal appearance.  Cardiovascular:     Rate and Rhythm: Normal rate and regular rhythm.  Pulmonary:     Effort: Pulmonary effort is normal.     Breath sounds: Normal breath sounds.  Musculoskeletal:        General: Normal range of motion.  Neurological:     General: No focal deficit present.     Mental Status: She is alert and oriented to person, place, and time.    Labs reviewed: Basic Metabolic Panel: Recent Labs    08/30/21 1407  NA 139  K 4.0  CL 106  CO2 26  GLUCOSE 136  BUN 30*  CREATININE 0.62  CALCIUM 9.4   Liver Function Tests: Recent Labs    08/30/21 1407  AST 19  ALT 16  BILITOT 0.4  PROT 7.2   No results for input(s): LIPASE, AMYLASE in the last 8760 hours. No results for input(s): AMMONIA in the last 8760 hours. CBC: Recent Labs    08/30/21 1407  WBC 5.8  NEUTROABS 3,074  HGB 13.5  HCT 39.7  MCV 95.0  PLT 248   Lipid Panel: Recent Labs    08/30/21 1407  CHOL 186  HDL 53  LDLCALC 105*  TRIG 166*  CHOLHDL 3.5   TSH: No results for input(s): TSH in the last 8760 hours. A1C: Lab Results  Component Value Date   HGBA1C 6.3 (H) 08/30/2021     Assessment/Plan  1. Hypothyroidism, unspecified type TSH was last checked about 1-1/2 years ago.  We will repeat today  2. Anxiety and depression She uses alprazolam for anxiety but sometimes mixes her alprazolam and her Ambien.  Encouraged separation of dosing depending on needs  3. Essential hypertension, benign Blood pressure is acceptable at 144/88 on losartan 25 mg  4. Elevated lipoprotein(a) Lipids were last assessed 3 months ago LDL was okay at 105.   Alain Honey, MD [facility] [phone]

## 2021-11-03 ENCOUNTER — Telehealth: Payer: Self-pay | Admitting: *Deleted

## 2021-11-03 ENCOUNTER — Encounter: Payer: Self-pay | Admitting: Family Medicine

## 2021-11-03 MED ORDER — LEVOTHYROXINE SODIUM 75 MCG PO TABS: 75.0000 ug | ORAL_TABLET | Freq: Every day | ORAL | 1 refills | Status: DC

## 2021-11-03 NOTE — Telephone Encounter (Signed)
-----   Message from Kelli Honour, MD sent at 11/03/2021  8:11 AM EST ----- TSH is too low which means need to reduce thyroid supplement, from 88 to 75 mcg

## 2021-11-03 NOTE — Telephone Encounter (Signed)
Patient notified and agreed. Medication list updated and Rx sent to Pharmacy.

## 2021-11-04 ENCOUNTER — Other Ambulatory Visit: Payer: Self-pay | Admitting: *Deleted

## 2021-11-04 MED ORDER — SIMVASTATIN 40 MG PO TABS: ORAL_TABLET | ORAL | 1 refills | Status: DC

## 2021-11-04 NOTE — Telephone Encounter (Signed)
Pharmacy requested refill

## 2021-11-17 ENCOUNTER — Emergency Department (HOSPITAL_COMMUNITY)
Admission: EM | Admit: 2021-11-17 | Discharge: 2021-11-17 | Disposition: A | Discharge status: Home / Self Care | Payer: Medicare Other | Attending: Physician Assistant | Admitting: Physician Assistant

## 2021-11-17 ENCOUNTER — Encounter (HOSPITAL_COMMUNITY): Payer: Self-pay | Admitting: Emergency Medicine

## 2021-11-17 ENCOUNTER — Emergency Department (HOSPITAL_COMMUNITY): Payer: Medicare Other

## 2021-11-17 DIAGNOSIS — Z5321 Procedure and treatment not carried out due to patient leaving prior to being seen by health care provider: Secondary | ICD-10-CM | POA: Diagnosis not present

## 2021-11-17 DIAGNOSIS — Z20822 Contact with and (suspected) exposure to covid-19: Secondary | ICD-10-CM | POA: Insufficient documentation

## 2021-11-17 DIAGNOSIS — R059 Cough, unspecified: Secondary | ICD-10-CM | POA: Diagnosis not present

## 2021-11-17 DIAGNOSIS — G4489 Other headache syndrome: Secondary | ICD-10-CM | POA: Diagnosis not present

## 2021-11-17 DIAGNOSIS — R519 Headache, unspecified: Secondary | ICD-10-CM | POA: Diagnosis not present

## 2021-11-17 LAB — CBC WITH DIFFERENTIAL/PLATELET
Abs Immature Granulocytes: 0.02 10*3/uL (ref 0.00–0.07)
Basophils Absolute: 0.1 10*3/uL (ref 0.0–0.1)
Basophils Relative: 1 %
Eosinophils Absolute: 0 10*3/uL (ref 0.0–0.5)
Eosinophils Relative: 0 %
HCT: 40 % (ref 36.0–46.0)
Hemoglobin: 13.4 g/dL (ref 12.0–15.0)
Immature Granulocytes: 0 %
Lymphocytes Relative: 32 %
Lymphs Abs: 2.4 10*3/uL (ref 0.7–4.0)
MCH: 30.8 pg (ref 26.0–34.0)
MCHC: 33.5 g/dL (ref 30.0–36.0)
MCV: 92 fL (ref 80.0–100.0)
Monocytes Absolute: 0.4 10*3/uL (ref 0.1–1.0)
Monocytes Relative: 5 %
Neutro Abs: 4.8 10*3/uL (ref 1.7–7.7)
Neutrophils Relative %: 62 %
Platelets: 239 10*3/uL (ref 150–400)
RBC: 4.35 MIL/uL (ref 3.87–5.11)
RDW: 14 % (ref 11.5–15.5)
WBC: 7.7 10*3/uL (ref 4.0–10.5)
nRBC: 0 % (ref 0.0–0.2)

## 2021-11-17 LAB — RESP PANEL BY RT-PCR (FLU A&B, COVID) ARPGX2
Influenza A by PCR: NEGATIVE
Influenza B by PCR: NEGATIVE
SARS Coronavirus 2 by RT PCR: NEGATIVE

## 2021-11-17 LAB — BASIC METABOLIC PANEL
Anion gap: 11 (ref 5–15)
BUN: 11 mg/dL (ref 8–23)
CO2: 22 mmol/L (ref 22–32)
Calcium: 9.8 mg/dL (ref 8.9–10.3)
Chloride: 103 mmol/L (ref 98–111)
Creatinine, Ser: 0.81 mg/dL (ref 0.44–1.00)
GFR, Estimated: >60 mL/min (ref >60)
Glucose, Bld: 122 mg/dL — ABNORMAL HIGH (ref 70–99)
Potassium: 3.8 mmol/L (ref 3.5–5.1)
Sodium: 136 mmol/L (ref 135–145)

## 2021-11-17 NOTE — ED Provider Triage Note (Signed)
Emergency Medicine Provider Triage Evaluation Note  Kelli [name] , a 76 y.o. female  was evaluated in triage.  Pt complains of headache.  Does not have history of chronic headache.  States initially started on the right side of her head and is now bilateral.  No vision changes, eye pain.  No facial droop, numbness, weakness.  Took #2 Goody powders and did not help.  Denies any recent head trauma, sudden onset thunderclap headache.  No neck pain, fever.  Has noticed a mild cough for which she has been taking menthol cough drops.  No chest pain, shortness of breath, lower extremity swelling.  No sick contacts.  Review of Systems  Positive: Headache, cough Negative: Fever, emesis, vision changes  Physical Exam  BP 127/68 (BP Location: Left Arm)    Pulse 97    Temp 99.8 F (37.7 C) (Oral)    Resp 16    SpO2 95%  Gen:   Awake, no distress   Resp:  Normal effort  MSK:   Moves extremities without difficulty  Neuro:  Cn 2-12 grossly intact, equal strength, intact sensation, ambulatory wo ataxic gait Other:    Medical Decision Making  Medically screening exam initiated at 1:08 PM.  Appropriate orders placed.  Kelli [name] was informed that the remainder of the evaluation will be completed by another provider, this initial triage assessment does not replace that evaluation, and the importance of remaining in the ED until their evaluation is complete.  HA, cough   Kelli [name] A, PA-C 11/17/21 1309

## 2021-11-17 NOTE — ED Triage Notes (Signed)
EMS stated, she has a headache  denies any other symptoms.

## 2021-11-17 NOTE — ED Notes (Addendum)
Called for vitals x3

## 2021-11-17 NOTE — ED Notes (Signed)
Pt called X2 for vitals with no response

## 2021-11-17 NOTE — ED Notes (Addendum)
Called PT at 18:23 pm no answer , for vitals

## 2021-11-18 ENCOUNTER — Telehealth: Payer: Self-pay

## 2021-11-18 NOTE — Telephone Encounter (Signed)
2 incoming calls received from patient in which she left a voicemail stating she was seen in the ER and unable to review her mychart account to see the results of test performed.  I returned call to patient and left a message informing her of the mychart help link phone number [phone]

## 2021-11-19 ENCOUNTER — Other Ambulatory Visit: Payer: Self-pay

## 2021-11-19 MED ORDER — MEDROXYPROGESTERONE ACETATE 2.5 MG PO TABS: 2.5000 mg | ORAL_TABLET | Freq: Every day | ORAL | 1 refills | Status: DC

## 2021-12-22 DIAGNOSIS — Z1152 Encounter for screening for COVID-19: Secondary | ICD-10-CM | POA: Diagnosis not present

## 2021-12-23 ENCOUNTER — Other Ambulatory Visit: Payer: Self-pay | Admitting: Family

## 2021-12-23 DIAGNOSIS — F32A Depression, unspecified: Secondary | ICD-10-CM

## 2021-12-23 DIAGNOSIS — F5104 Psychophysiologic insomnia: Secondary | ICD-10-CM

## 2021-12-24 NOTE — Telephone Encounter (Signed)
Patient has request refill on medications "Alprazolam", and "Ambien". Both medications were filled 10/18/2021. Patient has Non Opioid Contract signed 05/18/2020. Patient has upcoming appointment 05/03/2022. "Sign Contract" added to patient appointment note. Medications pend and sent to Marlowe Sax, NP due to PCP Sabra Heck Lillette Boxer, MD being out of office.  ?

## 2021-12-30 ENCOUNTER — Encounter: Payer: Medicare Other | Admitting: Nurse Practitioner

## 2022-01-12 DIAGNOSIS — Z1152 Encounter for screening for COVID-19: Secondary | ICD-10-CM | POA: Diagnosis not present

## 2022-01-24 IMAGING — CT CT ABD-PELV W/ CM
2 of 4 series · 16 of 46 positions shown, 18 images · IV contrast (OMNIPAQUE 300)
Comparison: None.

CLINICAL DATA: Right lower quadrant abdominal pain, vomiting and
nausea, inability to void

EXAM:
CT ABDOMEN AND PELVIS WITH CONTRAST
TECHNIQUE: Multidetector CT imaging of the abdomen and pelvis was performed
using the standard protocol following bolus administration of
intravenous contrast.
CONTRAST:  100mL OMNIPAQUE IOHEXOL 300 MG/ML  SOLN

[Series 2: axial st · axial · 0.75mm/px · z∈[-366,+9]mm · 13 of 83 slices shown, 15 images]
[im 4/83  soft-tissue]
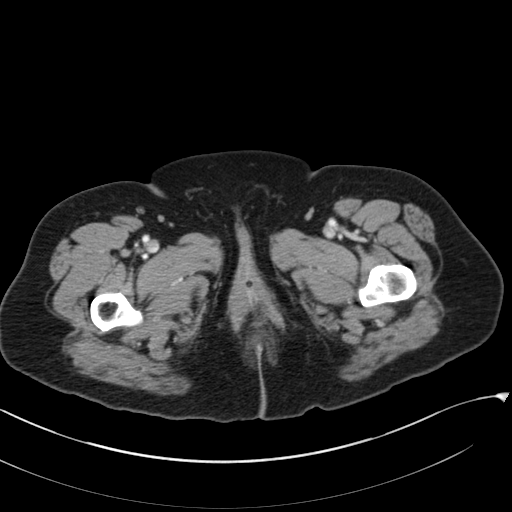
[im 4/83  bone]
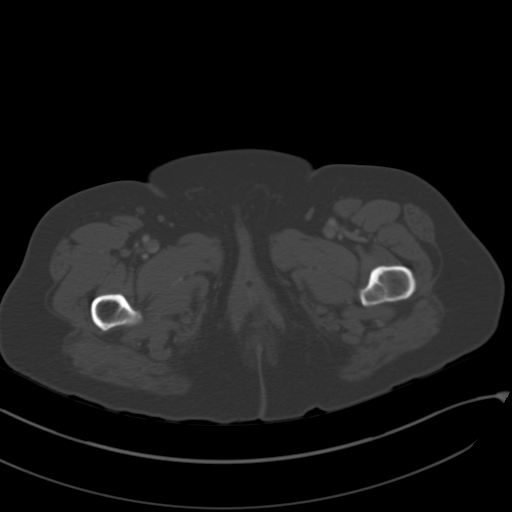
[im 12/83  soft-tissue]
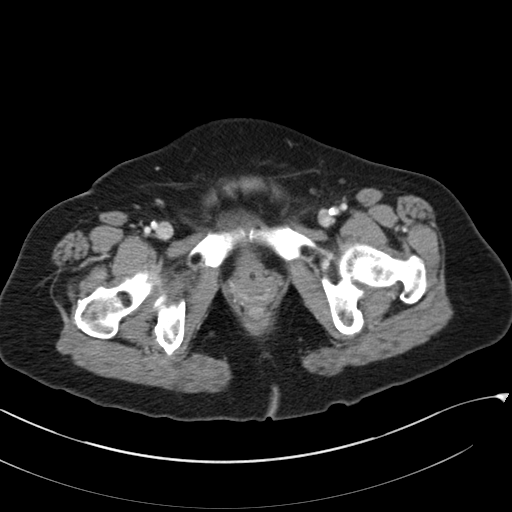
[im 19/83  soft-tissue]
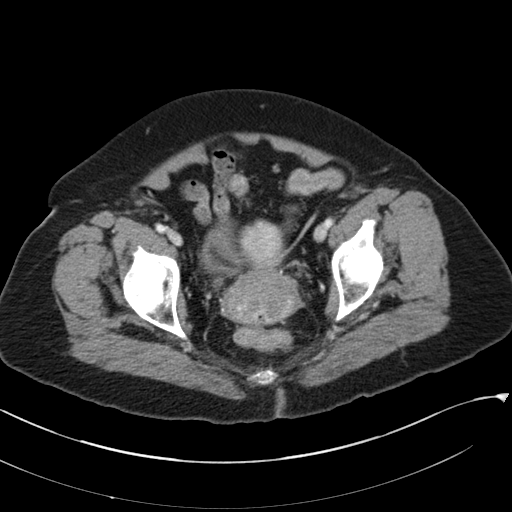
[im 23/83  soft-tissue]
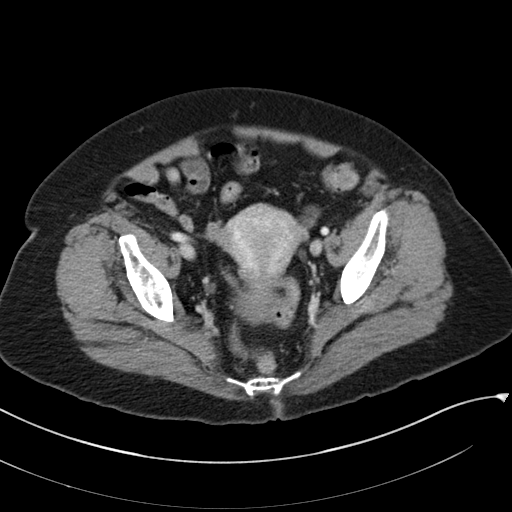
[im 30/83  soft-tissue]
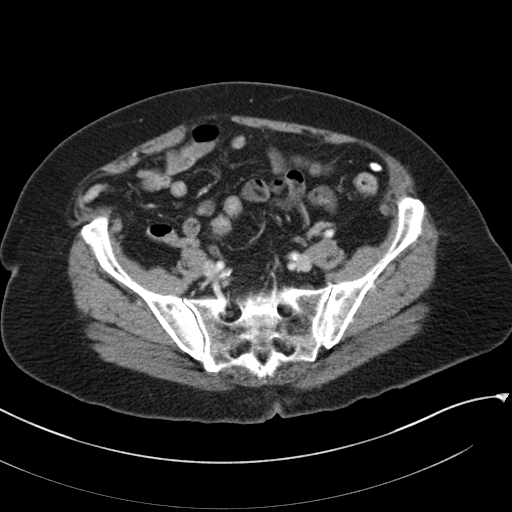
[im 34/83  soft-tissue]
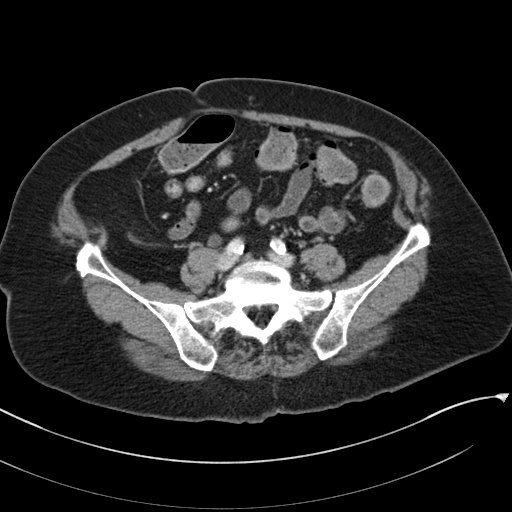
[im 42/83  soft-tissue]
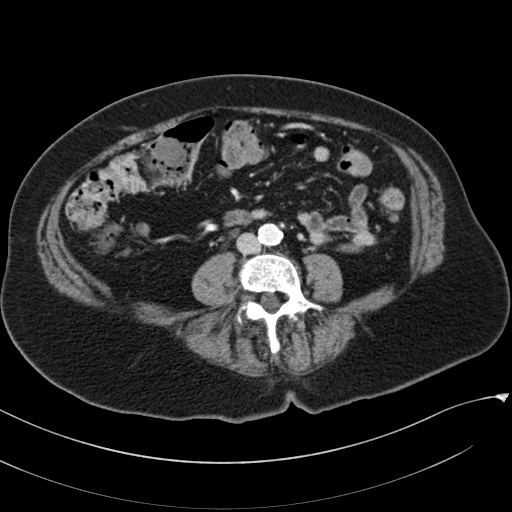
[im 49/83  soft-tissue]
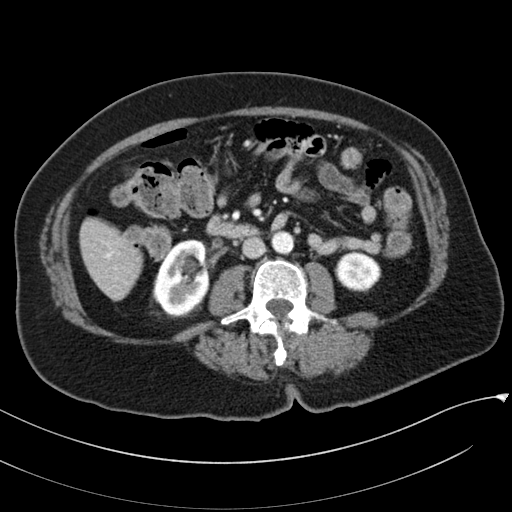
[im 53/83  soft-tissue]
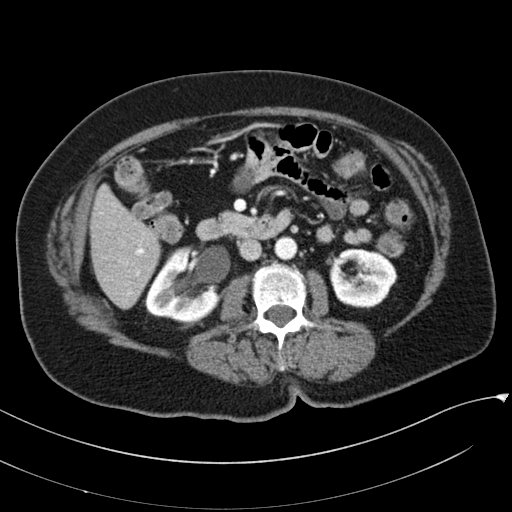
[im 53/83  bone]
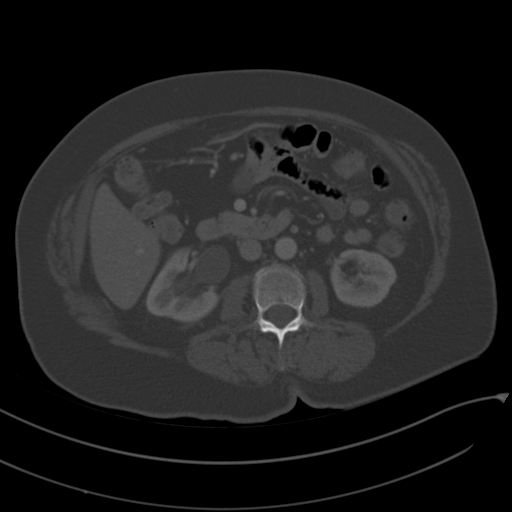
[im 60/83  soft-tissue]
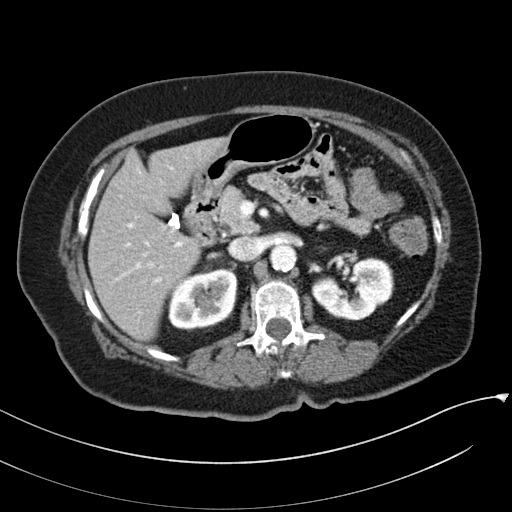
[im 64/83  soft-tissue]
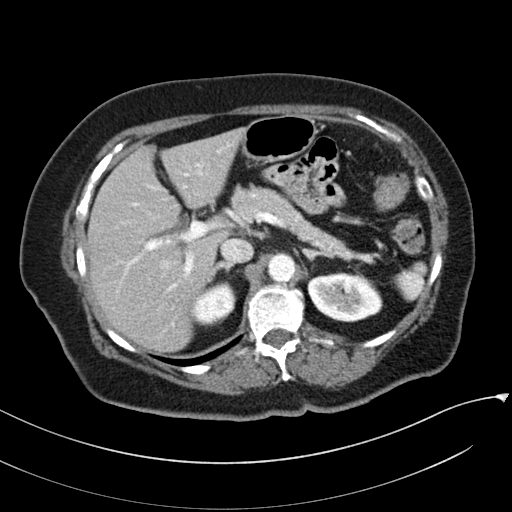
[im 71/83  soft-tissue]
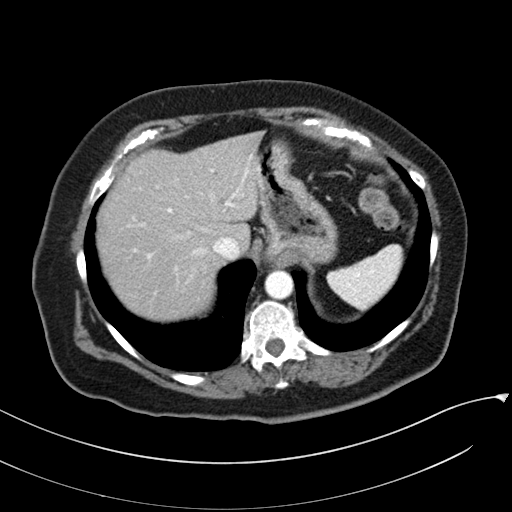
[im 79/83  soft-tissue]
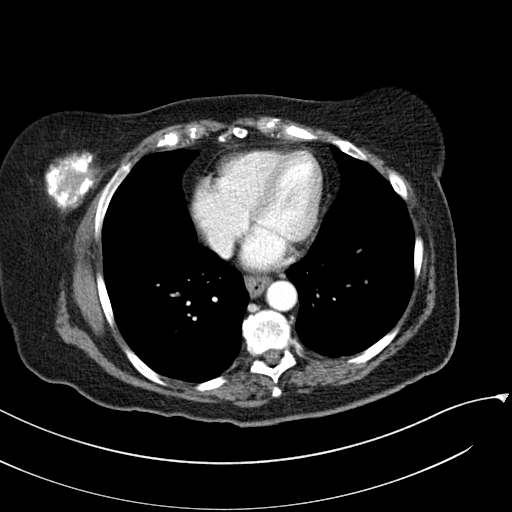

[Series 6: coronal st · coronal · 0.79mm/px · 3 of 145 slices shown]
[im 49/145  soft-tissue]
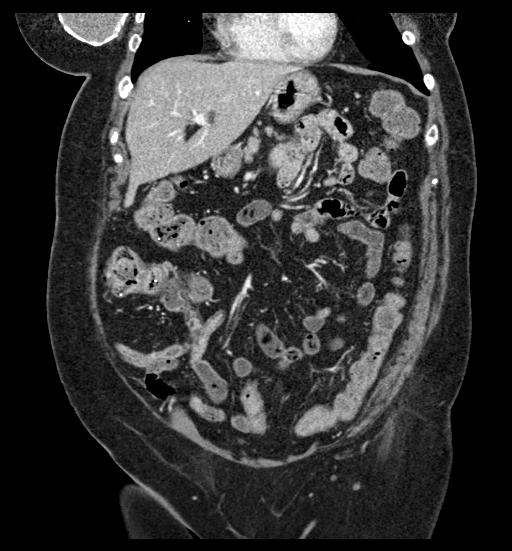
[im 65/145  soft-tissue]
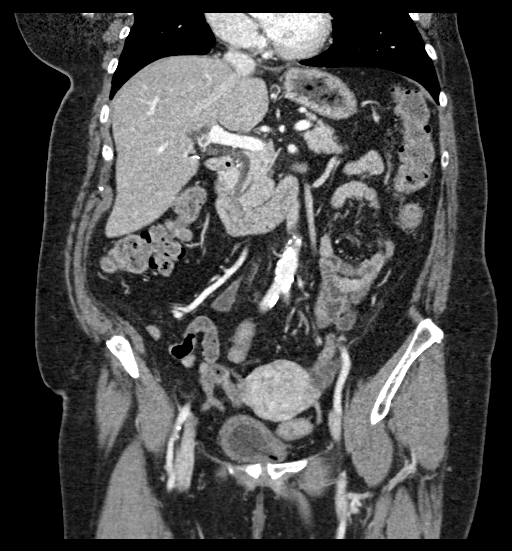
[im 81/145  soft-tissue]
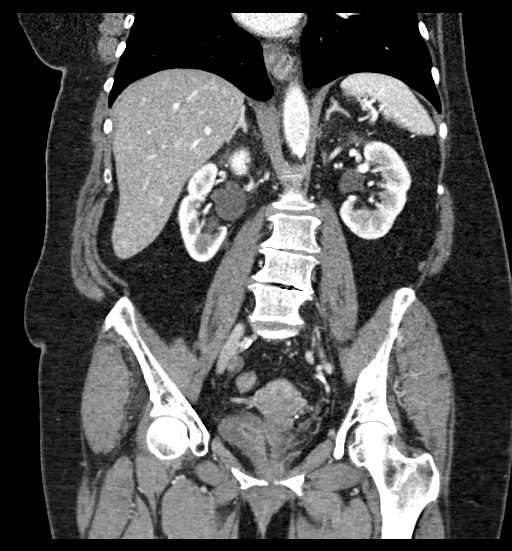

[16 of 46 positions shown; findings below may reference images not displayed]

FINDINGS: Lower chest: No acute pleural or parenchymal lung disease.

Hepatobiliary: No focal liver abnormality is seen. Status post
cholecystectomy. No biliary dilatation.

Pancreas: Unremarkable. No pancreatic ductal dilatation or
surrounding inflammatory changes.

Spleen: Normal in size without focal abnormality.

Adrenals/Urinary Tract: The kidneys enhance normally and
symmetrically. There is right-sided obstructive uropathy related to
an obstructing 4 mm right UVJ calculus, reference image 68. No
left-sided obstruction.

The bladder is decompressed with a Foley catheter. Gas in the
bladder lumen likely due to catheterization.

The adrenals are unremarkable.

Stomach/Bowel: No bowel obstruction or ileus. There is a normal
appendix in the right lower quadrant, without evidence of
inflammation. Small appendicolith measures 4 mm.

Vascular/Lymphatic: Extensive atherosclerosis of the abdominal
aorta. No evidence of high-grade stenosis. No pathologic adenopathy
within the abdomen or pelvis.

Reproductive: The cervix appears bulky and heterogeneous. Please
correlate with direct visual inspection and Pap smear if not
recently performed. Remainder of the uterus is unremarkable. Simple
appearing 1.8 cm left adnexal cyst. Right adnexa is unremarkable.

Other: No free fluid.  There is a small hiatal hernia.

Musculoskeletal: No acute or destructive bony lesions. Reconstructed
images demonstrate no additional findings.
IMPRESSION: 1. Right-sided obstructive uropathy related to a 4 mm obstructing
right UVJ calculus.
2. Bulky heterogeneous appearance of the cervix. Recommend
correlation with pelvic examination and Pap smear, if not recently
performed.
[DATE] cm simple appearing left adnexal cyst. Six-month follow-up
could be performed by ultrasound.

## 2022-01-31 DIAGNOSIS — Z1152 Encounter for screening for COVID-19: Secondary | ICD-10-CM | POA: Diagnosis not present

## 2022-02-23 ENCOUNTER — Other Ambulatory Visit: Payer: Self-pay | Admitting: Family

## 2022-02-23 DIAGNOSIS — F5104 Psychophysiologic insomnia: Secondary | ICD-10-CM

## 2022-02-23 DIAGNOSIS — F32A Depression, unspecified: Secondary | ICD-10-CM

## 2022-02-23 MED ORDER — LOSARTAN POTASSIUM 25 MG PO TABS: 25.0000 mg | ORAL_TABLET | Freq: Every day | ORAL | 1 refills | Status: DC

## 2022-02-23 MED ORDER — LEVOTHYROXINE SODIUM 75 MCG PO TABS: 75.0000 ug | ORAL_TABLET | Freq: Every day | ORAL | 1 refills | Status: DC

## 2022-02-23 NOTE — Telephone Encounter (Signed)
Pharmacy requested refill per Dr. Sanjuan Dame approval appoint upcoming in 05/2022

## 2022-04-28 ENCOUNTER — Other Ambulatory Visit: Payer: Self-pay | Admitting: *Deleted

## 2022-04-28 DIAGNOSIS — F419 Anxiety disorder, unspecified: Secondary | ICD-10-CM

## 2022-04-28 DIAGNOSIS — F5104 Psychophysiologic insomnia: Secondary | ICD-10-CM

## 2022-04-28 MED ORDER — ZOLPIDEM TARTRATE 5 MG PO TABS: ORAL_TABLET | ORAL | 1 refills | Status: DC

## 2022-04-28 MED ORDER — ALPRAZOLAM 1 MG PO TABS: ORAL_TABLET | ORAL | 1 refills | Status: DC

## 2022-04-28 NOTE — Telephone Encounter (Signed)
Patient requested refill.  Epic LR: 02/23/2022 Contract Date: 05/14/2020 Note added to upcoming appointment to update.   Pended Rx's and sent to [facility] for approval due to Dr. Sabra Heck out of Office.

## 2022-05-02 ENCOUNTER — Other Ambulatory Visit: Payer: Self-pay | Admitting: *Deleted

## 2022-05-02 DIAGNOSIS — F32A Depression, unspecified: Secondary | ICD-10-CM

## 2022-05-02 MED ORDER — ALPRAZOLAM 1 MG PO TABS: ORAL_TABLET | ORAL | 1 refills | Status: DC

## 2022-05-02 NOTE — Telephone Encounter (Signed)
Patient stated that [facility] does not have Alprazolam in [place] and she cannot get her Rx.   She needs Rx sent to [facility] because [facility] cannot transfer this Rx.   Pended Rx and sent to [facility] for approval due to Dr. Sabra Heck out of office.

## 2022-05-02 NOTE — Telephone Encounter (Signed)
Refilled

## 2022-05-03 ENCOUNTER — Ambulatory Visit (INDEPENDENT_AMBULATORY_CARE_PROVIDER_SITE_OTHER): Payer: Medicare Other | Admitting: Family Medicine

## 2022-05-03 ENCOUNTER — Encounter: Payer: Self-pay | Admitting: Family Medicine

## 2022-05-03 VITALS — BP 140/88 | HR 88 | Temp 97.7°F | Ht 59.0 in | Wt 147.6 lb

## 2022-05-03 DIAGNOSIS — E7841 Elevated Lipoprotein(a): Secondary | ICD-10-CM

## 2022-05-03 DIAGNOSIS — F419 Anxiety disorder, unspecified: Secondary | ICD-10-CM

## 2022-05-03 DIAGNOSIS — E039 Hypothyroidism, unspecified: Secondary | ICD-10-CM | POA: Diagnosis not present

## 2022-05-03 DIAGNOSIS — R7303 Prediabetes: Secondary | ICD-10-CM

## 2022-05-03 DIAGNOSIS — F32A Depression, unspecified: Secondary | ICD-10-CM | POA: Diagnosis not present

## 2022-05-03 DIAGNOSIS — L409 Psoriasis, unspecified: Secondary | ICD-10-CM

## 2022-05-03 DIAGNOSIS — I10 Essential (primary) hypertension: Secondary | ICD-10-CM

## 2022-05-03 DIAGNOSIS — L408 Other psoriasis: Secondary | ICD-10-CM

## 2022-05-03 MED ORDER — MEDROXYPROGESTERONE ACETATE 2.5 MG PO TABS: 2.5000 mg | ORAL_TABLET | Freq: Every day | ORAL | 1 refills | Status: DC

## 2022-05-03 MED ORDER — MOMETASONE FUROATE 0.1 % EX CREA: TOPICAL_CREAM | CUTANEOUS | 1 refills | Status: DC

## 2022-05-03 NOTE — Progress Notes (Signed)
Provider:  Alain Honey, MD  Careteam: Patient Care Team: Wardell Honour, MD as PCP - General (Family Medicine)  PLACE OF SERVICE:  [place] Directive information    No Known Allergies  Chief Complaint  Patient presents with   Medical Management of Chronic Issues    Patient presents today for a 6 month follow-up.   Quality Metric Gaps    TDAP, COVID booster #6     HPI: Patient is a 76 y.o. female patient presents today for routine management of chronic problems including hypertension, hypothyroidism, hyperlipidemia, hormone replacement therapy. Generally doing well does have some back pain and knee pain for which she sees orthopedics. Also complains today of some flareup of her psoriasis. We discussed her lipids.  She is on low potency statin.  If LDL is not at goal we will consider moving her to more effective statin. She has started back to the gym to try and get more regular exercise  Review of Systems:  Review of Systems  Constitutional: Negative.   HENT: Negative.    Eyes: Negative.   Respiratory: Negative.    Cardiovascular: Negative.   Genitourinary: Negative.   Musculoskeletal:  Positive for back pain.  Skin:  Positive for rash.  Psychiatric/Behavioral:  The patient is nervous/anxious.   All other systems reviewed and are negative.   Past Medical History:  Diagnosis Date   Anxiety    Cataract    BILATERAL REMOVAL   Chronic kidney disease    KIDNEY STONES 2 TIMES IN 20 YEARS    Colon polyp    Tubular Adenoma   Colon polyp, hyperplastic    DDD (degenerative disc disease), lumbar    Diverticulosis    Flushing    Hyperlipidemia    Hypertension    Hypertonicity of bladder    Insomnia, unspecified    Internal hemorrhoid    Other abnormal blood chemistry    Sigmoid polyp    Thyroid disease    Past Surgical History:  Procedure Laterality Date   BREAST ENHANCEMENT SURGERY  1969   has bilateral silicone implants    CHOLECYSTECTOMY     COLONOSCOPY  11/24/2015   Dr. Hilarie Fredrickson   ECTOPIC PREGNANCY SURGERY  1965   POLYPECTOMY     ROTATOR CUFF REPAIR  2013   Social History:   reports that she quit smoking about 26 years ago. Her smoking use included cigarettes. She has never used smokeless tobacco. She reports that she does not drink alcohol and does not use drugs.  Family History  Problem Relation Age of Onset   Cancer Mother        lung   Colon polyps Mother    Stroke Father    Colon cancer Neg Hx    Rectal cancer Neg Hx    Stomach cancer Neg Hx    Esophageal cancer Neg Hx     Medications: Patient's Medications  New Prescriptions   MOMETASONE (ELOCON) 0.1 % CREAM    Apply q day to affected areas  Previous Medications   ALPRAZOLAM (XANAX) 1 MG TABLET    Take Two Tablets by mouth at bedtime as needed for anxiety.   DICLOFENAC SODIUM (VOLTAREN) 1 % GEL    Apply 2 g topically 4 (four) times daily. To right shoulder for osteoarthritis   ESTRADIOL (ESTRACE) 1 MG TABLET    Take 1 tablet (1 mg total) by mouth daily.   LEVOTHYROXINE (SYNTHROID) 75 MCG TABLET    Take 1 tablet (  75 mcg total) by mouth daily before breakfast.   LOSARTAN (COZAAR) 25 MG TABLET    Take 1 tablet (25 mg total) by mouth daily.   SIMVASTATIN (ZOCOR) 40 MG TABLET    TAKE 1 TABLET EVERY DAY FOR CHOLESTEROL   ZOLPIDEM (AMBIEN) 5 MG TABLET    TAKE 1 TABLET BY MOUTH EVERYDAY AT BEDTIME  Modified Medications   Modified Medication Previous Medication   MEDROXYPROGESTERONE (PROVERA) 2.5 MG TABLET medroxyPROGESTERone (PROVERA) 2.5 MG tablet      Take 1 tablet (2.5 mg total) by mouth daily.    Take 1 tablet (2.5 mg total) by mouth daily.  Discontinued Medications   No medications on file    Physical Exam:  Vitals:   05/03/22 1458  BP: (!) 140/88  Pulse: 88  Temp: 97.7 F (36.5 C)  SpO2: 98%  Weight: 147 lb 9.6 oz (67 kg)  Height: '4\' 11"'$  (1.499 m)   Body mass index is 29.81 kg/m. Wt Readings from Last 3 Encounters:  05/03/22  147 lb 9.6 oz (67 kg)  11/02/21 147 lb 4.8 oz (66.8 kg)  07/27/21 150 lb (68 kg)    Physical Exam Vitals and nursing note reviewed.  Constitutional:      Appearance: Normal appearance.  Cardiovascular:     Rate and Rhythm: Normal rate and regular rhythm.  Pulmonary:     Effort: Pulmonary effort is normal.     Breath sounds: Normal breath sounds.  Skin:    Comments: Scattered red areas that have been labeled psoriasis although they are not scaly  Neurological:     General: No focal deficit present.     Mental Status: She is alert and oriented to person, place, and time.     Labs reviewed: Basic Metabolic Panel: Recent Labs    08/30/21 1407 11/02/21 1540 11/17/21 1314  NA 139  --  136  K 4.0  --  3.8  CL 106  --  103  CO2 26  --  22  GLUCOSE 136  --  122*  BUN 30*  --  11  CREATININE 0.62  --  0.81  CALCIUM 9.4  --  9.8  TSH  --  0.07*  --    Liver Function Tests: Recent Labs    08/30/21 1407  AST 19  ALT 16  BILITOT 0.4  PROT 7.2   No results for input(s): "LIPASE", "AMYLASE" in the last 8760 hours. No results for input(s): "AMMONIA" in the last 8760 hours. CBC: Recent Labs    08/30/21 1407 11/17/21 1314  WBC 5.8 7.7  NEUTROABS 3,074 4.8  HGB 13.5 13.4  HCT 39.7 40.0  MCV 95.0 92.0  PLT 248 239   Lipid Panel: Recent Labs    08/30/21 1407  CHOL 186  HDL 53  LDLCALC 105*  TRIG 166*  CHOLHDL 3.5   TSH: Recent Labs    11/02/21 1540  TSH 0.07*   A1C: Lab Results  Component Value Date   HGBA1C 6.3 (H) 08/30/2021     Assessment/Plan  1. Prediabetes Last A1c was 6.3.  Controlled by diet and weight  2. Essential hypertension, benign Blood pressure today 140/88.  Prior recording 144/88.  On low-dose losartan  3. Psoriasis of scalp Patient has been given some unknown cream before assume this was steroid.  I will try mometasone since this is approved for face    5. Anxiety and depression Continue with as needed Xanax and Ambien at  bedtime  6. Hypothyroidism, unspecified  type Check TSH today spent about 8 months currently on 75 mcg levothyroxine  7. Elevated lipoprotein(a) As noted previously if LDL not at goal consider switching to atorvastatin   Alain Honey, MD [place] [phone]

## 2022-05-04 LAB — LIPID PANEL
Cholesterol: 168 mg/dL (ref <200)
HDL: 49 mg/dL — ABNORMAL LOW (ref >=50)
LDL Cholesterol (Calc): 97 mg/dL (calc)
Non-HDL Cholesterol (Calc): 119 mg/dL (calc) (ref <130)
Total CHOL/HDL Ratio: 3.4 (calc) (ref <5.0)
Triglycerides: 122 mg/dL (ref <150)

## 2022-05-04 LAB — HEMOGLOBIN A1C
Hgb A1c MFr Bld: 6.1 % of total Hgb — ABNORMAL HIGH (ref <5.7)
Mean Plasma Glucose: 128 mg/dL
eAG (mmol/L): 7.1 mmol/L

## 2022-05-09 DIAGNOSIS — M47895 Other spondylosis, thoracolumbar region: Secondary | ICD-10-CM | POA: Diagnosis not present

## 2022-05-09 DIAGNOSIS — M546 Pain in thoracic spine: Secondary | ICD-10-CM | POA: Diagnosis not present

## 2022-05-16 ENCOUNTER — Telehealth: Payer: Self-pay | Admitting: *Deleted

## 2022-05-16 DIAGNOSIS — L409 Psoriasis, unspecified: Secondary | ICD-10-CM

## 2022-05-16 MED ORDER — SIMVASTATIN 40 MG PO TABS: ORAL_TABLET | ORAL | 1 refills | Status: DC

## 2022-05-16 NOTE — Telephone Encounter (Signed)
Patient called with Concerns:  1.) Requesting refill for Simvastatin.   -Refill sent to pharmacy as requested.   2.) Was given  mometasone for Psoriasis for her face. Patient is wondering if she could also use this for spots on her arms and if so, needs refill.   Pended Rx and sent to Dr. Sabra Heck for approval.   Please Advise.

## 2022-05-17 MED ORDER — MOMETASONE FUROATE 0.1 % EX CREA: TOPICAL_CREAM | CUTANEOUS | 1 refills | Status: DC

## 2022-05-17 NOTE — Telephone Encounter (Signed)
Kelli Honour, MD  You 18 minutes ago (8:07 AM)   Yes, can use mometasone everywhere       Patient notified, LM on Voicemail with Dr. Ammie Ferrier response. Rx sent to pharmacy.

## 2022-05-26 ENCOUNTER — Telehealth: Payer: Self-pay

## 2022-05-26 NOTE — Telephone Encounter (Signed)
Patient called stating that she was told that her losartan was going to be increased to 50 mg. When she picked it up from the pharmacy it was for 25 mg. Is patient suppose to be taking '25mg'$  or 50 mg.  Message routed to Dr. Lillette Boxer. Sabra Heck

## 2022-06-01 ENCOUNTER — Other Ambulatory Visit: Payer: Self-pay

## 2022-06-01 MED ORDER — ESTRADIOL 1 MG PO TABS: 1.0000 mg | ORAL_TABLET | Freq: Every day | ORAL | 1 refills | Status: DC

## 2022-06-01 NOTE — Telephone Encounter (Signed)
Spoke with patient and she stated that the losartan is too small to cut in half. Do you just want her to take the whole tablet.

## 2022-06-01 NOTE — Telephone Encounter (Signed)
Left message for patient to call office.  

## 2022-06-03 NOTE — Telephone Encounter (Signed)
Spoke with patient she verbalized her understanding and agreed. 

## 2022-06-18 DIAGNOSIS — Z23 Encounter for immunization: Secondary | ICD-10-CM | POA: Diagnosis not present

## 2022-06-29 ENCOUNTER — Other Ambulatory Visit: Payer: Self-pay | Admitting: Family

## 2022-06-29 DIAGNOSIS — F5104 Psychophysiologic insomnia: Secondary | ICD-10-CM

## 2022-06-29 NOTE — Telephone Encounter (Signed)
Patient is requesting a refill of the following medications: Requested Prescriptions   Pending Prescriptions Disp Refills   zolpidem (AMBIEN) 5 MG tablet [Pharmacy Med Name: ZOLPIDEM TARTRATE 5 MG TABLET] 30 tablet 1    Sig: TAKE 1 TABLET BY MOUTH EVERYDAY AT BEDTIME    Date of last refill: 04/28/2022  Refill amount: 30/1  Treatment agreement date: Not on file, notation made on pending appointment.

## 2022-07-02 DIAGNOSIS — Z23 Encounter for immunization: Secondary | ICD-10-CM | POA: Diagnosis not present

## 2022-07-03 ENCOUNTER — Other Ambulatory Visit: Payer: Self-pay | Admitting: Family

## 2022-07-03 DIAGNOSIS — F419 Anxiety disorder, unspecified: Secondary | ICD-10-CM

## 2022-07-04 NOTE — Telephone Encounter (Signed)
Pharmacy requested refill.  ?Pended Rx and sent to Dr. Miller for approval.  ?

## 2022-07-20 DIAGNOSIS — M5451 Vertebrogenic low back pain: Secondary | ICD-10-CM | POA: Diagnosis not present

## 2022-07-27 ENCOUNTER — Encounter: Payer: Self-pay | Admitting: Internal Medicine

## 2022-07-28 DIAGNOSIS — S39013S Strain of muscle, fascia and tendon of pelvis, sequela: Secondary | ICD-10-CM | POA: Diagnosis not present

## 2022-07-28 DIAGNOSIS — M545 Low back pain, unspecified: Secondary | ICD-10-CM | POA: Diagnosis not present

## 2022-08-05 DIAGNOSIS — S39013S Strain of muscle, fascia and tendon of pelvis, sequela: Secondary | ICD-10-CM | POA: Diagnosis not present

## 2022-08-05 DIAGNOSIS — M545 Low back pain, unspecified: Secondary | ICD-10-CM | POA: Diagnosis not present

## 2022-08-11 DIAGNOSIS — M545 Low back pain, unspecified: Secondary | ICD-10-CM | POA: Diagnosis not present

## 2022-08-11 DIAGNOSIS — S39013S Strain of muscle, fascia and tendon of pelvis, sequela: Secondary | ICD-10-CM | POA: Diagnosis not present

## 2022-08-16 DIAGNOSIS — M545 Low back pain, unspecified: Secondary | ICD-10-CM | POA: Diagnosis not present

## 2022-08-16 DIAGNOSIS — S39013S Strain of muscle, fascia and tendon of pelvis, sequela: Secondary | ICD-10-CM | POA: Diagnosis not present

## 2022-08-19 ENCOUNTER — Other Ambulatory Visit: Payer: Self-pay | Admitting: Family Medicine

## 2022-08-27 ENCOUNTER — Other Ambulatory Visit: Payer: Self-pay | Admitting: Family Medicine

## 2022-08-27 DIAGNOSIS — F5104 Psychophysiologic insomnia: Secondary | ICD-10-CM

## 2022-08-29 NOTE — Telephone Encounter (Signed)
Patient is requesting a refill of the following medications: Requested Prescriptions   Pending Prescriptions Disp Refills   zolpidem (AMBIEN) 5 MG tablet [Pharmacy Med Name: ZOLPIDEM TARTRATE 5 MG TABLET] 30 tablet 1    Sig: TAKE 1 TABLET BY MOUTH EVERYDAY AT BEDTIME    Date of last refill: 06/30/2022  Refill amount: 30/1 refill   Treatment agreement date: Not on file, notation already made on pending appointment for March 2024 with Dr.Miller

## 2022-09-05 DIAGNOSIS — S39013S Strain of muscle, fascia and tendon of pelvis, sequela: Secondary | ICD-10-CM | POA: Diagnosis not present

## 2022-09-05 DIAGNOSIS — M545 Low back pain, unspecified: Secondary | ICD-10-CM | POA: Diagnosis not present

## 2022-09-08 ENCOUNTER — Other Ambulatory Visit: Payer: Self-pay | Admitting: Family Medicine

## 2022-09-08 DIAGNOSIS — F419 Anxiety disorder, unspecified: Secondary | ICD-10-CM

## 2022-09-12 ENCOUNTER — Other Ambulatory Visit: Payer: Self-pay

## 2022-09-12 DIAGNOSIS — F32A Depression, unspecified: Secondary | ICD-10-CM

## 2022-09-13 ENCOUNTER — Encounter: Payer: Self-pay | Admitting: Internal Medicine

## 2022-09-13 MED ORDER — ALPRAZOLAM 1 MG PO TABS: ORAL_TABLET | ORAL | 1 refills | Status: DC

## 2022-10-10 ENCOUNTER — Other Ambulatory Visit: Payer: Self-pay

## 2022-10-10 MED ORDER — LEVOTHYROXINE SODIUM 75 MCG PO TABS
75.0000 ug | ORAL_TABLET | Freq: Every day | ORAL | 1 refills | Status: DC
Start: 2022-10-10 — End: 2023-01-26

## 2022-10-26 ENCOUNTER — Other Ambulatory Visit: Payer: Self-pay | Admitting: Adult Health

## 2022-10-26 DIAGNOSIS — F5104 Psychophysiologic insomnia: Secondary | ICD-10-CM

## 2022-10-27 NOTE — Telephone Encounter (Signed)
Patient is requesting a refill of the following medications: Requested Prescriptions   Pending Prescriptions Disp Refills   zolpidem (AMBIEN) 5 MG tablet [Pharmacy Med Name: ZOLPIDEM TARTRATE 5 MG TABLET] 30 tablet 5    Sig: TAKE 1 TABLET BY MOUTH EVERYDAY AT BEDTIME    Date of last refill: 08/30/22  Refill amount: 30/1  Treatment agreement date: Not on file, notation was already made for pending appointment on 12/07/22 to sign treatment agreement

## 2022-10-31 ENCOUNTER — Encounter: Payer: Self-pay | Admitting: *Deleted

## 2022-11-08 ENCOUNTER — Ambulatory Visit: Payer: Medicare Other | Admitting: Family Medicine

## 2022-11-16 ENCOUNTER — Ambulatory Visit: Payer: Medicare Other | Admitting: Family Medicine

## 2022-11-21 ENCOUNTER — Other Ambulatory Visit: Payer: Self-pay | Admitting: Family Medicine

## 2022-11-21 DIAGNOSIS — F32A Depression, unspecified: Secondary | ICD-10-CM

## 2022-11-21 NOTE — Telephone Encounter (Signed)
Pharmacy requested refill.  Epic LR: 09/14/2023 Needs Contract signed, added note to upcoming appointment.   Pended Rx and sent to [facility] for approval due to Dr. Sabra Heck out of office.

## 2022-11-25 ENCOUNTER — Other Ambulatory Visit: Payer: Self-pay | Admitting: Family Medicine

## 2022-11-25 NOTE — Telephone Encounter (Signed)
Patient has request refill on medication Estradiol. Patient has warnings. Medication pend and sent to Sherrie Mustache, NP

## 2022-11-30 ENCOUNTER — Ambulatory Visit: Payer: 59 | Admitting: Internal Medicine

## 2022-12-07 ENCOUNTER — Ambulatory Visit (INDEPENDENT_AMBULATORY_CARE_PROVIDER_SITE_OTHER): Payer: Medicare Other | Admitting: Family Medicine

## 2022-12-07 ENCOUNTER — Encounter: Payer: Self-pay | Admitting: Family Medicine

## 2022-12-07 VITALS — BP 132/84 | HR 78 | Temp 97.7°F | Ht 59.0 in | Wt 140.0 lb

## 2022-12-07 DIAGNOSIS — F32A Depression, unspecified: Secondary | ICD-10-CM

## 2022-12-07 DIAGNOSIS — R7303 Prediabetes: Secondary | ICD-10-CM

## 2022-12-07 DIAGNOSIS — I10 Essential (primary) hypertension: Secondary | ICD-10-CM

## 2022-12-07 DIAGNOSIS — L309 Dermatitis, unspecified: Secondary | ICD-10-CM | POA: Diagnosis not present

## 2022-12-07 DIAGNOSIS — M62830 Muscle spasm of back: Secondary | ICD-10-CM | POA: Diagnosis not present

## 2022-12-07 DIAGNOSIS — E782 Mixed hyperlipidemia: Secondary | ICD-10-CM | POA: Diagnosis not present

## 2022-12-07 DIAGNOSIS — Z79890 Hormone replacement therapy (postmenopausal): Secondary | ICD-10-CM | POA: Diagnosis not present

## 2022-12-07 DIAGNOSIS — L409 Psoriasis, unspecified: Secondary | ICD-10-CM | POA: Diagnosis not present

## 2022-12-07 DIAGNOSIS — E039 Hypothyroidism, unspecified: Secondary | ICD-10-CM

## 2022-12-07 DIAGNOSIS — M25561 Pain in right knee: Secondary | ICD-10-CM

## 2022-12-07 DIAGNOSIS — F419 Anxiety disorder, unspecified: Secondary | ICD-10-CM

## 2022-12-07 DIAGNOSIS — E7841 Elevated Lipoprotein(a): Secondary | ICD-10-CM | POA: Diagnosis not present

## 2022-12-07 MED ORDER — MOMETASONE FUROATE 0.1 % EX CREA: TOPICAL_CREAM | CUTANEOUS | 1 refills | Status: AC

## 2022-12-07 NOTE — Progress Notes (Signed)
Provider:  Alain Honey, MD  Careteam: Patient Care Team: Wardell Honour, MD as PCP - General (Family Medicine)  PLACE OF SERVICE:  [place] Directive information Does Patient Have a Medical Advance Directive?: Yes, Type of Advance Directive: [place];Living will, Does patient want to make changes to medical advance directive?: No - Patient declined  No Known Allergies  Chief Complaint  Patient presents with   Medical Management of Chronic Issues    Medical Management of Chronic Issues. 6 Month follow up   Quality Metric Gaps    To discuss need for Tdap, Flu, Covid, Colonoscopy     HPI: Patient is a 77 y.o. female .  Patient here today for medical management of chronic problems including low back pain, hyperlipidemia, hypothyroidism, hormone replacement therapy, and anxiety and depression.  She brings in a list of concerns today and we tried to address those issues.  She is requesting referral to chiropractor for chronic back pain. She has been monitoring her blood pressure; takes losartan 25 mg.  Blood pressures have been generally between [id number] and [id number] systolic and between 75 and 83 diastolic.  She still continues with Ambien for insomnia as well as alprazolam but is trying to taper that. She also takes Estrace and Provera.  This has been ongoing for some years.  I expressed concern about use of hormones long-term.  She is not able to get mammograms due to her breast inserts.  Again expressed concern about risk of breast cancer especially without regular mammograms   Review of Systems:  Review of Systems  Constitutional: Negative.   Eyes: Negative.   Respiratory: Negative.    Cardiovascular: Negative.   Gastrointestinal: Negative.   Genitourinary: Negative.   Musculoskeletal:  Positive for back pain.  Skin: Negative.   Neurological: Negative.   Psychiatric/Behavioral:  The patient is nervous/anxious and has insomnia.     Past  Medical History:  Diagnosis Date   Anxiety    Cataract    BILATERAL REMOVAL   Chronic kidney disease    KIDNEY STONES 2 TIMES IN 20 YEARS    Colon polyp    Tubular Adenoma   Colon polyp, hyperplastic    DDD (degenerative disc disease), lumbar    Diverticulosis    Flushing    Hyperlipidemia    Hypertension    Hypertonicity of bladder    Insomnia, unspecified    Internal hemorrhoid    Other abnormal blood chemistry    Sigmoid polyp    Thyroid disease    Tubular adenoma of colon    Past Surgical History:  Procedure Laterality Date   BREAST ENHANCEMENT SURGERY  1969   has bilateral silicone implants   CHOLECYSTECTOMY     COLONOSCOPY  11/24/2015   Dr. Hilarie Fredrickson   ECTOPIC PREGNANCY SURGERY  1965   POLYPECTOMY     ROTATOR CUFF REPAIR  2013   Social History:   reports that she quit smoking about 27 years ago. Her smoking use included cigarettes. She has never used smokeless tobacco. She reports that she does not drink alcohol and does not use drugs.  Family History  Problem Relation Age of Onset   Cancer Mother        lung   Colon polyps Mother    Stroke Father    Colon cancer Neg Hx    Rectal cancer Neg Hx    Stomach cancer Neg Hx    Esophageal cancer Neg Hx  Medications: Patient's Medications  New Prescriptions   No medications on file  Previous Medications   ALPRAZOLAM (XANAX) 1 MG TABLET    MAY TAKE 2 TABLETS BY MOUTH AT BEDTIME AS NEEDED FOR ANXIETY   DICLOFENAC SODIUM (VOLTAREN) 1 % GEL    Apply 2 g topically 4 (four) times daily. To right shoulder for osteoarthritis   ESTRADIOL (ESTRACE) 1 MG TABLET    TAKE 1 TABLET BY MOUTH EVERY DAY   LEVOTHYROXINE (SYNTHROID) 75 MCG TABLET    Take 1 tablet (75 mcg total) by mouth daily before breakfast.   LOSARTAN (COZAAR) 25 MG TABLET    TAKE 1 TABLET (25 MG TOTAL) BY MOUTH DAILY.   MEDROXYPROGESTERONE (PROVERA) 2.5 MG TABLET    Take 1 tablet (2.5 mg total) by mouth daily.   MOMETASONE (ELOCON) 0.1 % CREAM    Apply q day  to affected areas   SIMVASTATIN (ZOCOR) 40 MG TABLET    TAKE 1 TABLET EVERY DAY FOR CHOLESTEROL   ZOLPIDEM (AMBIEN) 5 MG TABLET    TAKE 1 TABLET BY MOUTH EVERYDAY AT BEDTIME  Modified Medications   No medications on file  Discontinued Medications   No medications on file    Physical Exam:  Vitals:   12/07/22 1421  BP: 132/84  Pulse: 78  Temp: 97.7 F (36.5 C)  SpO2: 96%  Weight: 140 lb (63.5 kg)  Height: '4\' 11"'$  (1.499 m)   Body mass index is 28.28 kg/m. Wt Readings from Last 3 Encounters:  12/07/22 140 lb (63.5 kg)  05/03/22 147 lb 9.6 oz (67 kg)  11/02/21 147 lb 4.8 oz (66.8 kg)    Physical Exam Vitals reviewed.  Constitutional:      Appearance: Normal appearance.  Cardiovascular:     Rate and Rhythm: Normal rate and regular rhythm.  Pulmonary:     Effort: Pulmonary effort is normal.     Breath sounds: Normal breath sounds.  Musculoskeletal:        General: Normal range of motion.  Neurological:     General: No focal deficit present.     Mental Status: She is alert and oriented to person, place, and time.  Psychiatric:        Mood and Affect: Mood normal.        Behavior: Behavior normal.        Thought Content: Thought content normal.     Labs reviewed: Basic Metabolic Panel: No results for input(s): "NA", "K", "CL", "CO2", "GLUCOSE", "BUN", "CREATININE", "CALCIUM", "MG", "PHOS", "TSH" in the last 8760 hours. Liver Function Tests: No results for input(s): "AST", "ALT", "ALKPHOS", "BILITOT", "PROT", "ALBUMIN" in the last 8760 hours. No results for input(s): "LIPASE", "AMYLASE" in the last 8760 hours. No results for input(s): "AMMONIA" in the last 8760 hours. CBC: No results for input(s): "WBC", "NEUTROABS", "HGB", "HCT", "MCV", "PLT" in the last 8760 hours. Lipid Panel: Recent Labs    05/03/22 1549  CHOL 168  HDL 49*  LDLCALC 97  TRIG 122  CHOLHDL 3.4   TSH: No results for input(s): "TSH" in the last 8760 hours. A1C: Lab Results  Component  Value Date   HGBA1C 6.1 (H) 05/03/2022     Assessment/Plan  1. Prediabetes Patient's weight is increased compared to last visit 6 months ago.  Discussed need for weight loss and carb restriction with prediabetes  2. Essential hypertension, benign Blood pressures are good.  Continue with losartan as before  3. Hypothyroidism, unspecified type Will check TSH today 1 year  ago in therapeutic range  4. Mixed hyperlipidemia Patient takes simvastatin.  LDL was 97 6 months ago.  Likely needs to continue  5. Hormone replacement therapy Reluctantly continue treatment per patient request.  She is aware of risks  6. Anxiety and depression  Refill prescription for alprazolam when she is trying to taper off of as well as zolpidem for sleep  8. Acute pain of right knee Patient uses diclofenac gel and this does give her some relief.  She also takes some ibuprofen   Alain Honey, MD [place] [phone]

## 2022-12-08 LAB — COMPREHENSIVE METABOLIC PANEL
AG Ratio: 1.2 (calc) (ref 1.0–2.5)
ALT: 10 U/L (ref 6–29)
AST: 15 U/L (ref 10–35)
Albumin: 4.2 g/dL (ref 3.6–5.1)
Alkaline phosphatase (APISO): 50 U/L (ref 37–153)
BUN: 22 mg/dL (ref 7–25)
CO2: 26 mmol/L (ref 20–32)
Calcium: 10.2 mg/dL (ref 8.6–10.4)
Chloride: 104 mmol/L (ref 98–110)
Creat: 0.69 mg/dL (ref 0.60–1.00)
Globulin: 3.6 g/dL (calc) (ref 1.9–3.7)
Glucose, Bld: 125 mg/dL (ref 65–139)
Potassium: 4.2 mmol/L (ref 3.5–5.3)
Sodium: 138 mmol/L (ref 135–146)
Total Bilirubin: 0.4 mg/dL (ref 0.2–1.2)
Total Protein: 7.8 g/dL (ref 6.1–8.1)

## 2022-12-08 LAB — LIPID PANEL
Cholesterol: 200 mg/dL — ABNORMAL HIGH (ref <200)
HDL: 57 mg/dL (ref >=50)
LDL Cholesterol (Calc): 114 mg/dL (calc) — ABNORMAL HIGH
Non-HDL Cholesterol (Calc): 143 mg/dL (calc) — ABNORMAL HIGH (ref <130)
Total CHOL/HDL Ratio: 3.5 (calc) (ref <5.0)
Triglycerides: 175 mg/dL — ABNORMAL HIGH (ref <150)

## 2022-12-08 LAB — TSH: TSH: 4.91 mIU/L — ABNORMAL HIGH (ref 0.40–4.50)

## 2022-12-08 LAB — HEMOGLOBIN A1C
Hgb A1c MFr Bld: 6.3 % of total Hgb — ABNORMAL HIGH (ref <5.7)
Mean Plasma Glucose: 134 mg/dL
eAG (mmol/L): 7.4 mmol/L

## 2022-12-12 ENCOUNTER — Other Ambulatory Visit: Payer: Self-pay | Admitting: Family Medicine

## 2022-12-13 DIAGNOSIS — M9904 Segmental and somatic dysfunction of sacral region: Secondary | ICD-10-CM | POA: Diagnosis not present

## 2022-12-13 DIAGNOSIS — M5136 Other intervertebral disc degeneration, lumbar region: Secondary | ICD-10-CM | POA: Diagnosis not present

## 2022-12-13 DIAGNOSIS — M9905 Segmental and somatic dysfunction of pelvic region: Secondary | ICD-10-CM | POA: Diagnosis not present

## 2022-12-13 DIAGNOSIS — M9903 Segmental and somatic dysfunction of lumbar region: Secondary | ICD-10-CM | POA: Diagnosis not present

## 2022-12-19 DIAGNOSIS — M9903 Segmental and somatic dysfunction of lumbar region: Secondary | ICD-10-CM | POA: Diagnosis not present

## 2022-12-19 DIAGNOSIS — M9905 Segmental and somatic dysfunction of pelvic region: Secondary | ICD-10-CM | POA: Diagnosis not present

## 2022-12-19 DIAGNOSIS — M9904 Segmental and somatic dysfunction of sacral region: Secondary | ICD-10-CM | POA: Diagnosis not present

## 2022-12-19 DIAGNOSIS — M5136 Other intervertebral disc degeneration, lumbar region: Secondary | ICD-10-CM | POA: Diagnosis not present

## 2022-12-20 ENCOUNTER — Other Ambulatory Visit: Payer: Self-pay | Admitting: Nurse Practitioner

## 2022-12-20 DIAGNOSIS — F419 Anxiety disorder, unspecified: Secondary | ICD-10-CM

## 2022-12-20 NOTE — Telephone Encounter (Signed)
Patient is requesting a refill of the following medications: Requested Prescriptions   Pending Prescriptions Disp Refills   ALPRAZolam (XANAX) 1 MG tablet [Pharmacy Med Name: ALPRAZOLAM 1 MG TABLET] 60 tablet 0    Sig: MAY TAKE 2 TABLETS BY MOUTH AT BEDTIME AS NEEDED FOR ANXIETY    Date of last refill:11/22/2022  Refill amount: 60  Treatment agreement date: 12/07/2022

## 2022-12-21 DIAGNOSIS — M5136 Other intervertebral disc degeneration, lumbar region: Secondary | ICD-10-CM | POA: Diagnosis not present

## 2022-12-21 DIAGNOSIS — M9905 Segmental and somatic dysfunction of pelvic region: Secondary | ICD-10-CM | POA: Diagnosis not present

## 2022-12-21 DIAGNOSIS — M9903 Segmental and somatic dysfunction of lumbar region: Secondary | ICD-10-CM | POA: Diagnosis not present

## 2022-12-21 DIAGNOSIS — M9904 Segmental and somatic dysfunction of sacral region: Secondary | ICD-10-CM | POA: Diagnosis not present

## 2022-12-23 ENCOUNTER — Encounter: Payer: Medicare Other | Admitting: Adult Health

## 2022-12-23 NOTE — Progress Notes (Signed)
This encounter was created in error - please disregard.

## 2022-12-26 DIAGNOSIS — M9905 Segmental and somatic dysfunction of pelvic region: Secondary | ICD-10-CM | POA: Diagnosis not present

## 2022-12-26 DIAGNOSIS — M9904 Segmental and somatic dysfunction of sacral region: Secondary | ICD-10-CM | POA: Diagnosis not present

## 2022-12-26 DIAGNOSIS — M5136 Other intervertebral disc degeneration, lumbar region: Secondary | ICD-10-CM | POA: Diagnosis not present

## 2022-12-26 DIAGNOSIS — M9903 Segmental and somatic dysfunction of lumbar region: Secondary | ICD-10-CM | POA: Diagnosis not present

## 2022-12-28 DIAGNOSIS — M5136 Other intervertebral disc degeneration, lumbar region: Secondary | ICD-10-CM | POA: Diagnosis not present

## 2022-12-28 DIAGNOSIS — M9903 Segmental and somatic dysfunction of lumbar region: Secondary | ICD-10-CM | POA: Diagnosis not present

## 2022-12-28 DIAGNOSIS — M9905 Segmental and somatic dysfunction of pelvic region: Secondary | ICD-10-CM | POA: Diagnosis not present

## 2022-12-28 DIAGNOSIS — M9904 Segmental and somatic dysfunction of sacral region: Secondary | ICD-10-CM | POA: Diagnosis not present

## 2023-01-03 DIAGNOSIS — M9905 Segmental and somatic dysfunction of pelvic region: Secondary | ICD-10-CM | POA: Diagnosis not present

## 2023-01-03 DIAGNOSIS — M9904 Segmental and somatic dysfunction of sacral region: Secondary | ICD-10-CM | POA: Diagnosis not present

## 2023-01-03 DIAGNOSIS — M9903 Segmental and somatic dysfunction of lumbar region: Secondary | ICD-10-CM | POA: Diagnosis not present

## 2023-01-03 DIAGNOSIS — M5136 Other intervertebral disc degeneration, lumbar region: Secondary | ICD-10-CM | POA: Diagnosis not present

## 2023-01-16 DIAGNOSIS — M9905 Segmental and somatic dysfunction of pelvic region: Secondary | ICD-10-CM | POA: Diagnosis not present

## 2023-01-16 DIAGNOSIS — M5136 Other intervertebral disc degeneration, lumbar region: Secondary | ICD-10-CM | POA: Diagnosis not present

## 2023-01-16 DIAGNOSIS — M9904 Segmental and somatic dysfunction of sacral region: Secondary | ICD-10-CM | POA: Diagnosis not present

## 2023-01-16 DIAGNOSIS — M9903 Segmental and somatic dysfunction of lumbar region: Secondary | ICD-10-CM | POA: Diagnosis not present

## 2023-01-23 ENCOUNTER — Telehealth: Payer: Self-pay

## 2023-01-23 DIAGNOSIS — H40013 Open angle with borderline findings, low risk, bilateral: Secondary | ICD-10-CM | POA: Diagnosis not present

## 2023-01-23 DIAGNOSIS — H532 Diplopia: Secondary | ICD-10-CM | POA: Diagnosis not present

## 2023-01-23 NOTE — Telephone Encounter (Signed)
Patient walked in office and filled out walk-in form. She would like to be weaned off of Xanax.  Message routed to Dr. Jacalyn Lefevre

## 2023-01-24 NOTE — Telephone Encounter (Signed)
I need more information concerning her usage before I can advise on weaning. Record shows 2 at bedtime

## 2023-01-25 ENCOUNTER — Other Ambulatory Visit: Payer: Self-pay | Admitting: Family Medicine

## 2023-02-02 ENCOUNTER — Encounter: Payer: Self-pay | Admitting: *Deleted

## 2023-02-21 DIAGNOSIS — M5136 Other intervertebral disc degeneration, lumbar region: Secondary | ICD-10-CM | POA: Diagnosis not present

## 2023-02-21 DIAGNOSIS — M9904 Segmental and somatic dysfunction of sacral region: Secondary | ICD-10-CM | POA: Diagnosis not present

## 2023-02-21 DIAGNOSIS — M9905 Segmental and somatic dysfunction of pelvic region: Secondary | ICD-10-CM | POA: Diagnosis not present

## 2023-02-21 DIAGNOSIS — M9903 Segmental and somatic dysfunction of lumbar region: Secondary | ICD-10-CM | POA: Diagnosis not present

## 2023-02-28 ENCOUNTER — Telehealth: Payer: Self-pay

## 2023-02-28 DIAGNOSIS — M9905 Segmental and somatic dysfunction of pelvic region: Secondary | ICD-10-CM | POA: Diagnosis not present

## 2023-02-28 DIAGNOSIS — M9903 Segmental and somatic dysfunction of lumbar region: Secondary | ICD-10-CM | POA: Diagnosis not present

## 2023-02-28 DIAGNOSIS — M9904 Segmental and somatic dysfunction of sacral region: Secondary | ICD-10-CM | POA: Diagnosis not present

## 2023-02-28 DIAGNOSIS — M5136 Other intervertebral disc degeneration, lumbar region: Secondary | ICD-10-CM | POA: Diagnosis not present

## 2023-02-28 NOTE — Telephone Encounter (Signed)
Refill alprazolam 

## 2023-02-28 NOTE — Telephone Encounter (Signed)
Fax received from [facility] pharmacy for Alprazolam 1 mg tablet.  Medication last refilled 12/21/2022. Up to date treatment agreement on file.  Medication pended and sent to Dr Jacalyn Lefevre

## 2023-03-01 DIAGNOSIS — M5136 Other intervertebral disc degeneration, lumbar region: Secondary | ICD-10-CM | POA: Diagnosis not present

## 2023-03-01 DIAGNOSIS — M9904 Segmental and somatic dysfunction of sacral region: Secondary | ICD-10-CM | POA: Diagnosis not present

## 2023-03-01 DIAGNOSIS — M9905 Segmental and somatic dysfunction of pelvic region: Secondary | ICD-10-CM | POA: Diagnosis not present

## 2023-03-01 DIAGNOSIS — M9903 Segmental and somatic dysfunction of lumbar region: Secondary | ICD-10-CM | POA: Diagnosis not present

## 2023-03-02 ENCOUNTER — Other Ambulatory Visit: Payer: Self-pay | Admitting: *Deleted

## 2023-03-02 DIAGNOSIS — F419 Anxiety disorder, unspecified: Secondary | ICD-10-CM

## 2023-03-02 MED ORDER — ALPRAZOLAM 1 MG PO TABS: ORAL_TABLET | ORAL | 0 refills | Status: DC

## 2023-03-02 NOTE — Telephone Encounter (Signed)
Patient called requesting refill on her Alprazolam.  Epic LR: 12/21/2022 Contract Date: 12/07/2022  Pended Rx and sent to [facility] for approval due to Dr. Hyacinth Meeker out of office.

## 2023-03-08 DIAGNOSIS — M9905 Segmental and somatic dysfunction of pelvic region: Secondary | ICD-10-CM | POA: Diagnosis not present

## 2023-03-08 DIAGNOSIS — M5136 Other intervertebral disc degeneration, lumbar region: Secondary | ICD-10-CM | POA: Diagnosis not present

## 2023-03-08 DIAGNOSIS — M9903 Segmental and somatic dysfunction of lumbar region: Secondary | ICD-10-CM | POA: Diagnosis not present

## 2023-03-08 DIAGNOSIS — M9904 Segmental and somatic dysfunction of sacral region: Secondary | ICD-10-CM | POA: Diagnosis not present

## 2023-03-13 ENCOUNTER — Ambulatory Visit (INDEPENDENT_AMBULATORY_CARE_PROVIDER_SITE_OTHER): Payer: Medicare Other | Admitting: Internal Medicine

## 2023-03-13 ENCOUNTER — Encounter: Payer: Self-pay | Admitting: Internal Medicine

## 2023-03-13 ENCOUNTER — Telehealth: Payer: Self-pay | Admitting: Internal Medicine

## 2023-03-13 VITALS — BP 130/78 | HR 94 | Ht 59.0 in | Wt 141.4 lb

## 2023-03-13 DIAGNOSIS — Z860100 Personal history of colon polyps, unspecified: Secondary | ICD-10-CM

## 2023-03-13 DIAGNOSIS — Z1211 Encounter for screening for malignant neoplasm of colon: Secondary | ICD-10-CM

## 2023-03-13 DIAGNOSIS — K5909 Other constipation: Secondary | ICD-10-CM | POA: Diagnosis not present

## 2023-03-13 DIAGNOSIS — Z860101 Personal history of adenomatous and serrated colon polyps: Secondary | ICD-10-CM

## 2023-03-13 MED ORDER — METOCLOPRAMIDE HCL 10 MG PO TABS
ORAL_TABLET | ORAL | 0 refills | Status: DC
Start: 2023-03-13 — End: 2023-06-21

## 2023-03-13 MED ORDER — NA SULFATE-K SULFATE-MG SULF 17.5-3.13-1.6 GM/177ML PO SOLN: 1.0000 | ORAL | 0 refills | Status: DC

## 2023-03-13 NOTE — Patient Instructions (Signed)
_______________________________________________________  If your blood pressure at your visit was 140/90 or greater, please contact your primary care physician to follow up on this.  _______________________________________________________  If you are age 79 or older, your body mass index should be between 23-30. Your Body mass index is 28.55 kg/m. If this is out of the aforementioned range listed, please consider follow up with your Primary Care Provider.  If you are age 41 or younger, your body mass index should be between 19-25. Your Body mass index is 28.55 kg/m. If this is out of the aformentioned range listed, please consider follow up with your Primary Care Provider.   ________________________________________________________  The [facility] providers would like to encourage you to use [facility] to communicate with providers for non-urgent requests or questions.  Due to long hold times on the telephone, sending your provider a message by [facility] may be a faster and more efficient way to get a response.  Please allow 48 business hours for a response.  Please remember that this is for non-urgent requests.  _______________________________________________________   We have sent the following medications to your pharmacy for you to pick up at your convenience: Suprep, Reglan  Continue to take your miralax daily  Take Reglan 10 mg before each dose of prep.  You have been scheduled for a colonoscopy. Please follow written instructions given to you at your visit today.  Please pick up your prep supplies at the pharmacy within the next 1-3 days. If you use inhalers (even only as needed), please bring them with you on the day of your procedure.  Due to recent changes in healthcare laws, you may see the results of your imaging and laboratory studies on MyChart before your provider has had a chance to review them.  We understand that in some cases there may be results that are confusing or  concerning to you. Not all laboratory results come back in the same time frame and the provider may be waiting for multiple results in order to interpret others.  Please give [place] 48 hours in order for your provider to thoroughly review all the results before contacting the office for clarification of your results.

## 2023-03-13 NOTE — Telephone Encounter (Signed)
Patient needs prep instructions mailed to her due to her leaving them in the bathroom. Please advise.  Thank you

## 2023-03-13 NOTE — Progress Notes (Signed)
Patient ID: Jerrel Ivory, female   DOB: September 11, 1946, 77 y.o.   MRN: [id number] HPI: Adaleya Mcdaniels is a 77 year old female with a history of multiple adenomatous colon polyps, prediabetes, hypothyroidism, hyperlipidemia, anxiety and depression who is here to discuss surveillance colonoscopy and constipation.  She is here alone today.  I performed 2 colonoscopies for her the last in October 2020.  5 adenomatous polyps were removed.  Diverticulosis. In February 2017: 10 polyps were removed predominantly adenomatous in nature with several small hyperplastic polyps.  Several of the adenomas were greater than a centimeter.  She reports she is feeling well.  She has had issues with lower back and knee pain which is new for her.  From a GI perspective she does have constipation but this is well treated with daily MiraLAX.  As long as she remains on this medication she has a daily bowel movement without blood or melena.  No abdominal pain.  Good appetite and no hepatobiliary complaints.   Past Medical History:  Diagnosis Date   Anxiety    Cataract    BILATERAL REMOVAL   Chronic kidney disease    KIDNEY STONES 2 TIMES IN 20 YEARS    Colon polyp, hyperplastic    DDD (degenerative disc disease), lumbar    Diverticulosis    Flushing    Hyperlipidemia    Hypertension    Hypertonicity of bladder    Insomnia, unspecified    Internal hemorrhoid    Other abnormal blood chemistry    Sigmoid polyp    Thyroid disease    Tubular adenoma of colon     Past Surgical History:  Procedure Laterality Date   BREAST ENHANCEMENT SURGERY  1969   has bilateral silicone implants   CHOLECYSTECTOMY     COLONOSCOPY  11/24/2015   Dr. Rhea Belton   ECTOPIC PREGNANCY SURGERY  1965   POLYPECTOMY     ROTATOR CUFF REPAIR  2013    Outpatient Medications Prior to Visit  Medication Sig Dispense Refill   ALPRAZolam (XANAX) 1 MG tablet Take 2 tablet by mouth at bedtime as needed for anxiety. 60 tablet 0   diclofenac  sodium (VOLTAREN) 1 % GEL Apply 2 g topically 4 (four) times daily. To right shoulder for osteoarthritis 100 g 5   estradiol (ESTRACE) 1 MG tablet TAKE 1 TABLET BY MOUTH EVERY DAY 90 tablet 1   levothyroxine (SYNTHROID) 75 MCG tablet TAKE 1 TABLET BY MOUTH DAILY BEFORE BREAKFAST. 90 tablet 1   losartan (COZAAR) 25 MG tablet TAKE 1 TABLET (25 MG TOTAL) BY MOUTH DAILY. 90 tablet 3   medroxyPROGESTERone (PROVERA) 2.5 MG tablet Take 1 tablet (2.5 mg total) by mouth daily. 90 tablet 1   mometasone (ELOCON) 0.1 % cream Apply q day to affected areas 45 g 1   simvastatin (ZOCOR) 40 MG tablet TAKE 1 TABLET BY MOUTH EVERY DAY FOR CHOLESTEROL 90 tablet 1   zolpidem (AMBIEN) 5 MG tablet TAKE 1 TABLET BY MOUTH EVERYDAY AT BEDTIME 30 tablet 5   No facility-administered medications prior to visit.    No Known Allergies  Family History  Problem Relation Age of Onset   Cancer Mother        lung   Colon polyps Mother    Stroke Father    Colon cancer Neg Hx    Rectal cancer Neg Hx    Stomach cancer Neg Hx    Esophageal cancer Neg Hx     Social History   Tobacco Use  Smoking status: Former    Types: Cigarettes    Quit date: 11/30/1995    Years since quitting: 27.3   Smokeless tobacco: Never  Vaping Use   Vaping Use: Never used  Substance Use Topics   Alcohol use: No    Alcohol/week: 0.0 standard drinks of alcohol   Drug use: No    ROS: As per history of present illness, otherwise negative  Ht 4\' 11"  (1.499 m)   Wt 141 lb 6 oz (64.1 kg)   BMI 28.55 kg/m  Gen: awake, alert, NAD HEENT: anicteric  CV: RRR, no mrg Pulm: CTA b/l Abd: soft, NT/ND, +BS throughout Ext: no c/c/e Neuro: nonfocal   RELEVANT LABS AND IMAGING: CBC    Component Value Date/Time   WBC 7.7 11/17/2021 1314   RBC 4.35 11/17/2021 1314   HGB 13.4 11/17/2021 1314   HGB 12.2 04/07/2015 1620   HCT 40.0 11/17/2021 1314   HCT 36.9 04/07/2015 1620   PLT 239 11/17/2021 1314   PLT 312 04/07/2015 1620   MCV 92.0  11/17/2021 1314   MCV 93 04/07/2015 1620   MCH 30.8 11/17/2021 1314   MCHC 33.5 11/17/2021 1314   RDW 14.0 11/17/2021 1314   RDW 14.6 04/07/2015 1620   LYMPHSABS 2.4 11/17/2021 1314   LYMPHSABS 2.2 04/07/2015 1620   MONOABS 0.4 11/17/2021 1314   EOSABS 0.0 11/17/2021 1314   EOSABS 0.0 04/07/2015 1620   BASOSABS 0.1 11/17/2021 1314   BASOSABS 0.0 04/07/2015 1620    CMP     Component Value Date/Time   NA 138 12/07/2022 1537   NA 139 02/22/2016 1447   K 4.2 12/07/2022 1537   CL 104 12/07/2022 1537   CO2 26 12/07/2022 1537   GLUCOSE 125 12/07/2022 1537   BUN 22 12/07/2022 1537   BUN 15 02/22/2016 1447   CREATININE 0.69 12/07/2022 1537   CALCIUM 10.2 12/07/2022 1537   PROT 7.8 12/07/2022 1537   PROT 7.5 04/07/2015 1620   ALBUMIN 3.8 11/28/2019 1810   ALBUMIN 4.2 04/07/2015 1620   AST 15 12/07/2022 1537   ALT 10 12/07/2022 1537   ALKPHOS 53 11/28/2019 1810   BILITOT 0.4 12/07/2022 1537   BILITOT 0.3 04/07/2015 1620   GFRNONAA >60 11/17/2021 1314   GFRNONAA 65 09/30/2020 0000   GFRAA 75 09/30/2020 0000    ASSESSMENT/PLAN: 77 year old female with a history of multiple adenomatous colon polyps, prediabetes, hypothyroidism, hyperlipidemia, anxiety and depression who is here to discuss surveillance colonoscopy.   Personal history of adenomatous colon polyps --we discussed surveillance colonoscopy including the risk, benefits and alternatives as well as the guidance on colonoscopy near age 50.  She has had multiple adenomatous colon polyps including those greater than a centimeter and so her risk is greater than average for recurrent polyps.  After discussing all together she wishes to proceed with colonoscopy which I think is very reasonable.  Will plan to repeat 2-day bowel prep but due to nausea and vomiting with last prep give metoclopramide 10 mg 1 hour before each half of her bowel prep. -- Colonoscopy in the [facility] with 2-day bowel prep -- Metoclopramide 10 mg 1 hour before  each half of her bowel prep  2.  Chronic constipation --currently well treated with MiraLAX, continue 17 g daily      WU:[id number], Bertram Millard, Md [address] [place],  [place] [address]

## 2023-03-27 ENCOUNTER — Other Ambulatory Visit: Payer: Self-pay | Admitting: Nurse Practitioner

## 2023-03-28 ENCOUNTER — Other Ambulatory Visit: Payer: Self-pay | Admitting: Family Medicine

## 2023-03-29 ENCOUNTER — Ambulatory Visit: Payer: Medicare Other | Admitting: Family Medicine

## 2023-04-03 ENCOUNTER — Other Ambulatory Visit: Payer: Self-pay | Admitting: Nurse Practitioner

## 2023-04-03 DIAGNOSIS — F419 Anxiety disorder, unspecified: Secondary | ICD-10-CM

## 2023-04-03 NOTE — Telephone Encounter (Signed)
Patient has request refill on medication Xanax 1mg . Patient medication last refilled 03/02/2023. Patient has Non Opioid Contract signed 12/09/2022. Medication pend and sent to PCP Hyacinth Meeker Bertram Millard, MD for approval.

## 2023-04-05 ENCOUNTER — Telehealth: Payer: Self-pay | Admitting: Internal Medicine

## 2023-04-05 NOTE — Telephone Encounter (Signed)
Inbound call from patient requesting prep instructions for 8/5 colonoscopy be mailed to her. States it will be easier for her to read. Please advise, thank you.

## 2023-04-10 NOTE — Telephone Encounter (Signed)
Patient was given copy of instructions at visit. Another copy of Instructions printed and mailed to patient as per request for patient.

## 2023-04-12 DIAGNOSIS — M170 Bilateral primary osteoarthritis of knee: Secondary | ICD-10-CM | POA: Diagnosis not present

## 2023-04-12 DIAGNOSIS — M1711 Unilateral primary osteoarthritis, right knee: Secondary | ICD-10-CM | POA: Diagnosis not present

## 2023-04-12 DIAGNOSIS — M1712 Unilateral primary osteoarthritis, left knee: Secondary | ICD-10-CM | POA: Diagnosis not present

## 2023-04-19 ENCOUNTER — Telehealth: Payer: Self-pay

## 2023-04-19 NOTE — Telephone Encounter (Signed)
Patient called to express that she has some right ear external pain, if she tries to lay on the right side. Patient has used OTC, 4% Lidocaine cream and it provides temporary relief. Patient also has a raised area on the external part of her right ear, resembles a pimple, and it goes away during the day time.   Pending appointment 05/10/23. Refused refused earlier appointment with another provider, as her preference is to see Dr.Miller ONLY in the pm.   Patient would like to know if Dr.Miller can recommend anything in the interim

## 2023-04-19 NOTE — Telephone Encounter (Signed)
Could try some heat over ear 2-3 times /day(15)

## 2023-04-27 ENCOUNTER — Other Ambulatory Visit: Payer: Self-pay | Admitting: Adult Health

## 2023-04-27 DIAGNOSIS — F5104 Psychophysiologic insomnia: Secondary | ICD-10-CM

## 2023-04-27 NOTE — Telephone Encounter (Signed)
Patient request refill

## 2023-04-27 NOTE — Telephone Encounter (Signed)
Patient has request refill on medication Ambien.

## 2023-04-28 ENCOUNTER — Other Ambulatory Visit: Payer: Self-pay | Admitting: Adult Health

## 2023-04-28 DIAGNOSIS — F5104 Psychophysiologic insomnia: Secondary | ICD-10-CM

## 2023-04-28 NOTE — Telephone Encounter (Signed)
Patient refill was sent to the pharmacy yesterday. The pharmacy note states that a new prescription need to be sent with a Diagnosis Code.  Patient is requesting a refill of the following medications: Requested Prescriptions   Pending Prescriptions Disp Refills   zolpidem (AMBIEN) 5 MG tablet [Pharmacy Med Name: ZOLPIDEM TARTRATE 5 MG TABLET] 30 tablet     Sig: TAKE 1 TABLET BY MOUTH EVERYDAY AT BEDTIME    Date of last refill:04/27/2023  Refill amount: 30 tablets 4 refills   Treatment agreement date: 12/09/2022

## 2023-05-06 ENCOUNTER — Other Ambulatory Visit: Payer: Self-pay | Admitting: Family Medicine

## 2023-05-06 DIAGNOSIS — F32A Depression, unspecified: Secondary | ICD-10-CM

## 2023-05-08 ENCOUNTER — Encounter: Payer: Medicare Other | Admitting: Internal Medicine

## 2023-05-08 DIAGNOSIS — M170 Bilateral primary osteoarthritis of knee: Secondary | ICD-10-CM | POA: Diagnosis not present

## 2023-05-08 NOTE — Telephone Encounter (Signed)
Medication last refilled 04/04/23. Patient has up to date treatment agreement on file.  Medication pended and sent to Abbey Chatters, NP

## 2023-05-10 ENCOUNTER — Encounter: Payer: Self-pay | Admitting: Family Medicine

## 2023-05-10 ENCOUNTER — Ambulatory Visit: Payer: Medicare Other | Admitting: Family Medicine

## 2023-05-10 VITALS — BP 132/78 | HR 77 | Temp 97.7°F | Resp 18 | Ht 59.0 in | Wt 135.2 lb

## 2023-05-10 DIAGNOSIS — E7841 Elevated Lipoprotein(a): Secondary | ICD-10-CM

## 2023-05-10 DIAGNOSIS — I10 Essential (primary) hypertension: Secondary | ICD-10-CM | POA: Diagnosis not present

## 2023-05-10 DIAGNOSIS — R7303 Prediabetes: Secondary | ICD-10-CM

## 2023-05-10 DIAGNOSIS — F419 Anxiety disorder, unspecified: Secondary | ICD-10-CM | POA: Diagnosis not present

## 2023-05-10 DIAGNOSIS — F32A Depression, unspecified: Secondary | ICD-10-CM

## 2023-05-10 DIAGNOSIS — L0390 Cellulitis, unspecified: Secondary | ICD-10-CM | POA: Diagnosis not present

## 2023-05-10 MED ORDER — MUPIROCIN CALCIUM 2 % EX CREA: TOPICAL_CREAM | Freq: Two times a day (BID) | CUTANEOUS | Status: AC

## 2023-05-10 MED ORDER — AZITHROMYCIN 250 MG PO TABS: ORAL_TABLET | ORAL | 0 refills | Status: AC

## 2023-05-10 NOTE — Patient Instructions (Signed)
Take meds as directed

## 2023-05-10 NOTE — Progress Notes (Signed)
Provider:  Jacalyn Lefevre, MD  Careteam: Patient Care Team: Frederica Kuster, MD as PCP - General (Family Medicine)  PLACE OF SERVICE:  [facility]  Advanced Directive information Does Patient Have a Medical Advance Directive?: No, Would patient like information on creating a medical advance directive?: No - Patient declined  No Known Allergies  Chief Complaint  Patient presents with   Acute Visit    Pain with right ear, need to discuss other issues      HPI: Patient is a 77 y.o. female she is here for medical management of chronic problems but chief complaint today is some soreness and pain in her right external ear.  She has a history of eczema and was given mometasone had been using that.  Also applied Neosporin for the soreness.  Says the pain is on the out her ear not inside her ear. She also spent a fair amount of time talking about her knee pain and back pain.  She has seen orthopedist for that and received injections in her knees as well as Celebrex.  Review of Systems:  Review of Systems  HENT:  Positive for ear pain.   Respiratory: Negative.    Cardiovascular: Negative.   Musculoskeletal:  Positive for joint pain.  Neurological: Negative.   Psychiatric/Behavioral: Negative.    All other systems reviewed and are negative.   Past Medical History:  Diagnosis Date   Anxiety    Cataract    BILATERAL REMOVAL   Chronic kidney disease    KIDNEY STONES 2 TIMES IN 20 YEARS    Colon polyp, hyperplastic    DDD (degenerative disc disease), lumbar    Diverticulosis    Flushing    Hyperlipidemia    Hypertension    Hypertonicity of bladder    Insomnia, unspecified    Internal hemorrhoid    Other abnormal blood chemistry    Sigmoid polyp    Thyroid disease    Tubular adenoma of colon    Past Surgical History:  Procedure Laterality Date   BREAST ENHANCEMENT SURGERY  1969   has bilateral silicone implants   CHOLECYSTECTOMY     COLONOSCOPY  11/24/2015   Dr.  Rhea Belton   ECTOPIC PREGNANCY SURGERY  1965   POLYPECTOMY     ROTATOR CUFF REPAIR  2013   Social History:   reports that she quit smoking about 27 years ago. Her smoking use included cigarettes. She has never used smokeless tobacco. She reports that she does not drink alcohol and does not use drugs.  Family History  Problem Relation Age of Onset   Cancer Mother        lung   Colon polyps Mother    Stroke Father    Colon cancer Neg Hx    Rectal cancer Neg Hx    Stomach cancer Neg Hx    Esophageal cancer Neg Hx     Medications: Patient's Medications  New Prescriptions   AZITHROMYCIN (ZITHROMAX) 250 MG TABLET    Take 2 tablets on day 1, then 1 tablet daily on days 2 through 5  Previous Medications   ALPRAZOLAM (XANAX) 1 MG TABLET    TAKE 2 TABLETS BY MOUTH AT BEDTIME AS NEEDED FOR ANXIETY.   DICLOFENAC SODIUM (VOLTAREN) 1 % GEL    Apply 2 g topically 4 (four) times daily. To right shoulder for osteoarthritis   ESTRADIOL (ESTRACE) 1 MG TABLET    TAKE 1 TABLET BY MOUTH EVERY DAY   LEVOTHYROXINE (SYNTHROID) 75  MCG TABLET    TAKE 1 TABLET BY MOUTH DAILY BEFORE BREAKFAST.   LOSARTAN (COZAAR) 25 MG TABLET    TAKE 1 TABLET (25 MG TOTAL) BY MOUTH DAILY.   MEDROXYPROGESTERONE (PROVERA) 2.5 MG TABLET    Take 1 tablet (2.5 mg total) by mouth daily.   METOCLOPRAMIDE (REGLAN) 10 MG TABLET    Take one pill by mouth 30 minutes prior to drinking prep.   MOMETASONE (ELOCON) 0.1 % CREAM    Apply q day to affected areas   NA SULFATE-K SULFATE-MG SULF 17.5-3.13-1.6 GM/177ML SOLN    Take 1 kit by mouth as directed.   SIMVASTATIN (ZOCOR) 40 MG TABLET    TAKE 1 TABLET BY MOUTH EVERY DAY FOR CHOLESTEROL   ZOLPIDEM (AMBIEN) 5 MG TABLET    TAKE 1 TABLET BY MOUTH EVERYDAY AT BEDTIME  Modified Medications   No medications on file  Discontinued Medications   No medications on file    Physical Exam:  Vitals:   05/10/23 1400  BP: 132/78  Pulse: 77  Resp: 18  Temp: 97.7 F (36.5 C)  SpO2: 95%  Weight:  135 lb 3.2 oz (61.3 kg)  Height: 4\' 11"  (1.499 m)   Body mass index is 27.31 kg/m. Wt Readings from Last 3 Encounters:  05/10/23 135 lb 3.2 oz (61.3 kg)  03/13/23 141 lb 6 oz (64.1 kg)  12/07/22 140 lb (63.5 kg)    Physical Exam Vitals and nursing note reviewed.  Constitutional:      Appearance: Normal appearance.  HENT:     Ears:     Comments: Outer section of right pinna is inflamed and tender.  Appears to be some cellulitis. Cardiovascular:     Rate and Rhythm: Normal rate and regular rhythm.  Pulmonary:     Effort: Pulmonary effort is normal.     Breath sounds: Normal breath sounds.  Skin:    General: Skin is warm and dry.     Findings: Erythema present.  Neurological:     General: No focal deficit present.     Mental Status: She is alert and oriented to person, place, and time.     Labs reviewed: Basic Metabolic Panel: Recent Labs    12/07/22 1537  NA 138  K 4.2  CL 104  CO2 26  GLUCOSE 125  BUN 22  CREATININE 0.69  CALCIUM 10.2  TSH 4.91*   Liver Function Tests: Recent Labs    12/07/22 1537  AST 15  ALT 10  BILITOT 0.4  PROT 7.8   No results for input(s): "LIPASE", "AMYLASE" in the last 8760 hours. No results for input(s): "AMMONIA" in the last 8760 hours. CBC: No results for input(s): "WBC", "NEUTROABS", "HGB", "HCT", "MCV", "PLT" in the last 8760 hours. Lipid Panel: Recent Labs    12/07/22 1537  CHOL 200*  HDL 57  LDLCALC 114*  TRIG 175*  CHOLHDL 3.5   TSH: Recent Labs    12/07/22 1537  TSH 4.91*   A1C: Lab Results  Component Value Date   HGBA1C 6.3 (H) 12/07/2022     Assessment/Plan 1. Prediabetes Had a long discussion about carbohydrate intake.  Last A1c was 6.3.  Patient wishes to have it tested again today  2. Elevated lipoprotein(a) She continues with simvastatin lipids were last assessed 4 months ago.  Could be a little better  3. Cellulitis, unspecified cellulitis site With discontinue Neosporin as it may be  part of the inflammation that I see.  Treat with Z-Pak and  Bactroban topical  4. Anxiety and depression Patient continues to take alprazolam at bedtime as needed also Ambien at bedtime  5. Essential hypertension, benign On losartan 25 mg daily blood pressure good at 132/78.  Patient also checking pressure at home and numbers are consistently within normal guidelines    Jacalyn Lefevre, MD [facility] [phone]

## 2023-05-15 DIAGNOSIS — M170 Bilateral primary osteoarthritis of knee: Secondary | ICD-10-CM | POA: Diagnosis not present

## 2023-05-16 DIAGNOSIS — M545 Low back pain, unspecified: Secondary | ICD-10-CM | POA: Diagnosis not present

## 2023-05-16 DIAGNOSIS — M25561 Pain in right knee: Secondary | ICD-10-CM | POA: Diagnosis not present

## 2023-05-17 DIAGNOSIS — M170 Bilateral primary osteoarthritis of knee: Secondary | ICD-10-CM | POA: Diagnosis not present

## 2023-05-19 DIAGNOSIS — Z23 Encounter for immunization: Secondary | ICD-10-CM | POA: Diagnosis not present

## 2023-05-23 DIAGNOSIS — M170 Bilateral primary osteoarthritis of knee: Secondary | ICD-10-CM | POA: Diagnosis not present

## 2023-05-30 DIAGNOSIS — M170 Bilateral primary osteoarthritis of knee: Secondary | ICD-10-CM | POA: Diagnosis not present

## 2023-06-08 ENCOUNTER — Other Ambulatory Visit: Payer: Self-pay | Admitting: Nurse Practitioner

## 2023-06-08 DIAGNOSIS — F32A Depression, unspecified: Secondary | ICD-10-CM

## 2023-06-09 NOTE — Telephone Encounter (Signed)
Patient has request refill on medication Xanax. Patient medication last refilled 05/08/2023. Patient has Non Opioid Contract on file dated 12/09/2022. Patient medication pend and sent to PCP Venita Sheffield, MD for approval.

## 2023-06-14 DIAGNOSIS — Z23 Encounter for immunization: Secondary | ICD-10-CM | POA: Diagnosis not present

## 2023-06-19 DIAGNOSIS — H40013 Open angle with borderline findings, low risk, bilateral: Secondary | ICD-10-CM | POA: Diagnosis not present

## 2023-06-21 ENCOUNTER — Encounter: Payer: Self-pay | Admitting: Sports Medicine

## 2023-06-21 ENCOUNTER — Ambulatory Visit (INDEPENDENT_AMBULATORY_CARE_PROVIDER_SITE_OTHER): Payer: Medicare Other | Admitting: Sports Medicine

## 2023-06-21 VITALS — BP 140/82 | HR 87 | Temp 97.4°F | Resp 16 | Ht 59.0 in | Wt 137.0 lb

## 2023-06-21 DIAGNOSIS — M25561 Pain in right knee: Secondary | ICD-10-CM

## 2023-06-21 DIAGNOSIS — I10 Essential (primary) hypertension: Secondary | ICD-10-CM | POA: Diagnosis not present

## 2023-06-21 DIAGNOSIS — F32A Depression, unspecified: Secondary | ICD-10-CM

## 2023-06-21 DIAGNOSIS — G8929 Other chronic pain: Secondary | ICD-10-CM

## 2023-06-21 DIAGNOSIS — F419 Anxiety disorder, unspecified: Secondary | ICD-10-CM

## 2023-06-21 DIAGNOSIS — E039 Hypothyroidism, unspecified: Secondary | ICD-10-CM | POA: Diagnosis not present

## 2023-06-21 DIAGNOSIS — Z79890 Hormone replacement therapy (postmenopausal): Secondary | ICD-10-CM | POA: Diagnosis not present

## 2023-06-21 DIAGNOSIS — F5101 Primary insomnia: Secondary | ICD-10-CM | POA: Diagnosis not present

## 2023-06-21 DIAGNOSIS — E782 Mixed hyperlipidemia: Secondary | ICD-10-CM | POA: Diagnosis not present

## 2023-06-21 DIAGNOSIS — M25562 Pain in left knee: Secondary | ICD-10-CM

## 2023-06-21 MED ORDER — VENLAFAXINE HCL ER 75 MG PO CP24
75.0000 mg | ORAL_CAPSULE | Freq: Every day | ORAL | 2 refills | Status: AC
Start: 2023-06-21 — End: ?

## 2023-06-21 NOTE — Progress Notes (Unsigned)
Careteam: Patient Care Team: Venita Sheffield, MD as PCP - General (Internal Medicine)  PLACE OF SERVICE:  [facility]  Advanced Directive information Does Patient Have a Medical Advance Directive?: Yes, Type of Advance Directive: Healthcare Power of [place];Out of facility DNR (pink MOST or yellow form);Living will, Does patient want to make changes to medical advance directive?: No - Patient declined  No Known Allergies  Chief Complaint  Patient presents with   Medical Management of Chronic Issues    6 month follow up.    Immunizations    Discuss the need for DTAP vaccine. NCIR Verified.      HPI: Patient is a 77 y.o. female presented to clinic for follow up  Pt states that when she lays on her Rt side she develops a sore on her Rt ear  She started sleeping on her back   Lives alone  Independent with ADLS , IADLS  Retired in 2007   GAD  On xanax 1 mg 2 tab at bedtime  Takes 1 tab 2 hrs prior to her bed time and another one 2 hrs later   Hormonal therapy  Pt was receiving estrace by Dr Hyacinth Meeker  Informed patient that I would not recommend HHT and would not do the refill  Ct from 2021 shows  . Bulky heterogeneous appearance of the cervix. Recommend correlation with pelvic examination and Pap smear, if not recently performed. 3. 1.8 cm simple appearing left adnexal cyst. Six-month follow-up could be performed by ultrasound.  Hypothyroidism  Lab Results  Component Value Date   TSH 4.91 (H) 12/07/2022   On synthyroid    HTN  142/ 80, repeat 120/80 On losartan     Latest Ref Rng & Units 12/07/2022    3:37 PM 11/17/2021    1:14 PM 08/30/2021    2:07 PM  BMP  Glucose 65 - 139 mg/dL 829  562  130   BUN 7 - 25 mg/dL 22  11  30    Creatinine 0.60 - 1.00 mg/dL 8.65  7.84  6.96   BUN/Creat Ratio 6 - 22 (calc) SEE NOTE:   48   Sodium 135 - 146 mmol/L 138  136  139   Potassium 3.5 - 5.3 mmol/L 4.2  3.8  4.0   Chloride 98 - 110 mmol/L 104  103  106   CO2  20 - 32 mmol/L 26  22  26    Calcium 8.6 - 10.4 mg/dL 29.5  9.8  9.4     HLD  The 10-year ASCVD risk score (Arnett DK, et al., 2019) is: 35.6%   Values used to calculate the score:     Age: 58 years     Sex: Female     Is Non-Hispanic African American: No     Diabetic: No     Tobacco smoker: No     Systolic Blood Pressure: 140 mmHg     Is BP treated: Yes     HDL Cholesterol: 47 mg/dL     Total Cholesterol: 160 mg/dL  Taking simvastatin  Denies muscle cramps  Insomnia Has problems going to sleep and maintaining sleep Day time naps- some times Coffee - does not drink  On ambien  every night   No falls No problems with her balance Chronic knee bilateral  Intermittent pain with pain radiating to both thigh  Just completed physical therapy   Review of Systems:  Review of Systems  Constitutional:  Negative for chills and fever.  HENT:  Negative for congestion and sore throat.   Eyes:  Negative for double vision.  Respiratory:  Negative for cough, sputum production and shortness of breath.   Cardiovascular:  Negative for chest pain, palpitations and leg swelling.  Gastrointestinal:  Negative for abdominal pain, heartburn and nausea.  Genitourinary:  Negative for dysuria, frequency and hematuria.  Musculoskeletal:  Positive for joint pain. Negative for falls and myalgias.  Neurological:  Negative for dizziness, sensory change and focal weakness.  Psychiatric/Behavioral:  The patient has insomnia.     Past Medical History:  Diagnosis Date   Anxiety    Cataract    BILATERAL REMOVAL   Chronic kidney disease    KIDNEY STONES 2 TIMES IN 20 YEARS    Colon polyp, hyperplastic    DDD (degenerative disc disease), lumbar    Diverticulosis    Flushing    Hyperlipidemia    Hypertension    Hypertonicity of bladder    Insomnia, unspecified    Internal hemorrhoid    Other abnormal blood chemistry    Sigmoid polyp    Thyroid disease    Tubular adenoma of colon    Past Surgical  History:  Procedure Laterality Date   BREAST ENHANCEMENT SURGERY  1969   has bilateral silicone implants   CHOLECYSTECTOMY     COLONOSCOPY  11/24/2015   Dr. Rhea Belton   ECTOPIC PREGNANCY SURGERY  1965   POLYPECTOMY     ROTATOR CUFF REPAIR  2013   Social History:   reports that she quit smoking about 27 years ago. Her smoking use included cigarettes. She has never used smokeless tobacco. She reports that she does not drink alcohol and does not use drugs.  Family History  Problem Relation Age of Onset   Cancer Mother        lung   Colon polyps Mother    Stroke Father    Colon cancer Neg Hx    Rectal cancer Neg Hx    Stomach cancer Neg Hx    Esophageal cancer Neg Hx     Medications: Patient's Medications  New Prescriptions   VENLAFAXINE XR (EFFEXOR XR) 75 MG 24 HR CAPSULE    Take 1 capsule (75 mg total) by mouth daily with breakfast.  Previous Medications   ALPRAZOLAM (XANAX) 1 MG TABLET    TAKE 2 TABLETS BY MOUTH AT BEDTIME AS NEEDED FOR ANXIETY.   DICLOFENAC SODIUM (VOLTAREN) 1 % GEL    Apply 2 g topically 4 (four) times daily. To right shoulder for osteoarthritis   ESTRADIOL (ESTRACE) 1 MG TABLET    TAKE 1 TABLET BY MOUTH EVERY DAY   LEVOTHYROXINE (SYNTHROID) 75 MCG TABLET    TAKE 1 TABLET BY MOUTH DAILY BEFORE BREAKFAST.   LOSARTAN (COZAAR) 25 MG TABLET    TAKE 1 TABLET (25 MG TOTAL) BY MOUTH DAILY.   MELOXICAM (MOBIC) 15 MG TABLET    Take 15 mg by mouth in the morning and at bedtime.   MOMETASONE (ELOCON) 0.1 % CREAM    Apply q day to affected areas   SIMVASTATIN (ZOCOR) 40 MG TABLET    TAKE 1 TABLET BY MOUTH EVERY DAY FOR CHOLESTEROL   ZOLPIDEM (AMBIEN) 5 MG TABLET    TAKE 1 TABLET BY MOUTH EVERYDAY AT BEDTIME  Modified Medications   No medications on file  Discontinued Medications   MEDROXYPROGESTERONE (PROVERA) 2.5 MG TABLET    Take 1 tablet (2.5 mg total) by mouth daily.   METOCLOPRAMIDE (REGLAN) 10 MG TABLET  Take one pill by mouth 30 minutes prior to drinking prep.    NA SULFATE-K SULFATE-MG SULF 17.5-3.13-1.6 GM/177ML SOLN    Take 1 kit by mouth as directed.    Physical Exam:  Vitals:   06/21/23 1504  BP: (!) 140/82  Pulse: 87  Resp: 16  Temp: (!) 97.4 F (36.3 C)  SpO2: 97%  Weight: 137 lb (62.1 kg)  Height: 4\' 11"  (1.499 m)   Body mass index is 27.67 kg/m. Wt Readings from Last 3 Encounters:  06/21/23 137 lb (62.1 kg)  05/10/23 135 lb 3.2 oz (61.3 kg)  03/13/23 141 lb 6 oz (64.1 kg)    Physical Exam Constitutional:      Appearance: Normal appearance.  HENT:     Head: Normocephalic and atraumatic.     Ears:     Comments: Very tiny  superficial healed scab on rt Ear  Cardiovascular:     Rate and Rhythm: Normal rate and regular rhythm.     Heart sounds: No murmur heard. Pulmonary:     Effort: Pulmonary effort is normal. No respiratory distress.     Breath sounds: Normal breath sounds. No wheezing.  Abdominal:     General: Bowel sounds are normal. There is no distension.     Tenderness: There is no abdominal tenderness. There is no guarding or rebound.  Musculoskeletal:        General: No swelling or tenderness.     Comments: Bil knee- no swelling Rom intact  No joint line tenderness  Skin:    General: Skin is dry.  Neurological:     Mental Status: She is alert. Mental status is at baseline.     Sensory: No sensory deficit.     Motor: No weakness.     Labs reviewed: Basic Metabolic Panel: Recent Labs    12/07/22 1537  NA 138  K 4.2  CL 104  CO2 26  GLUCOSE 125  BUN 22  CREATININE 0.69  CALCIUM 10.2  TSH 4.91*   Liver Function Tests: Recent Labs    12/07/22 1537  AST 15  ALT 10  BILITOT 0.4  PROT 7.8   No results for input(s): "LIPASE", "AMYLASE" in the last 8760 hours. No results for input(s): "AMMONIA" in the last 8760 hours. CBC: No results for input(s): "WBC", "NEUTROABS", "HGB", "HCT", "MCV", "PLT" in the last 8760 hours. Lipid Panel: Recent Labs    12/07/22 1537 05/10/23 1526  CHOL 200*  160  HDL 57 47*  LDLCALC 114* 82  TRIG 175* 216*  CHOLHDL 3.5 3.4   TSH: Recent Labs    12/07/22 1537  TSH 4.91*   A1C: Lab Results  Component Value Date   HGBA1C 6.2 (H) 05/10/2023     Assessment/Plan  1. Anxiety and depression Will start effexor  Cont with xanax for now with plan to decrease to gradually in the future  Will follow up in 6 weeks - venlafaxine XR (EFFEXOR XR) 75 MG 24 hr capsule; Take 1 capsule (75 mg total) by mouth daily with breakfast.  Dispense: 90 capsule; Refill: 2  2. Essential hypertension, benign Repeat bp  Cont with losartan  3. Hypothyroidism, unspecified type Will check TSH and adjust dose accordingly Cont with synthroid  - TSH  4. Hormone replacement therapy Informed patient that I would not do the refill for estrace tab  Will refer to gyn - Ambulatory referral to Gynecology  5. Primary insomnia Discussed sleep hygiene Cont with Remus Loffler  Will consider gradually tapering  in the future will not overwhelm with too many changes  6. Mixed hyperlipidemia Cont with simvastatin  7. Chronic pain of both knees Chronic Pt reported that sports medicine gave her mobic 2 tab daily  Instructed to stop meloxicam Take extra strength tylenol 650 mg q6 prn  Use volatren gel  Instructed to do home exercises  Other orders    No follow-ups on file.:   6 weeks

## 2023-06-21 NOTE — Patient Instructions (Signed)
Stop meloxicam Do not take ibuprufen  Start  tylenol 650 mg  every 8 hrs as needed   Anxiety - start taking  effexor for anxiety  Cont with xanax   Cont with  [employer]

## 2023-06-22 LAB — TSH: TSH: 0.93 mIU/L (ref 0.40–4.50)

## 2023-06-27 DIAGNOSIS — M5136 Other intervertebral disc degeneration, lumbar region: Secondary | ICD-10-CM | POA: Diagnosis not present

## 2023-06-27 DIAGNOSIS — M9903 Segmental and somatic dysfunction of lumbar region: Secondary | ICD-10-CM | POA: Diagnosis not present

## 2023-06-27 DIAGNOSIS — M9904 Segmental and somatic dysfunction of sacral region: Secondary | ICD-10-CM | POA: Diagnosis not present

## 2023-06-27 DIAGNOSIS — M9905 Segmental and somatic dysfunction of pelvic region: Secondary | ICD-10-CM | POA: Diagnosis not present

## 2023-06-28 DIAGNOSIS — M5136 Other intervertebral disc degeneration, lumbar region: Secondary | ICD-10-CM | POA: Diagnosis not present

## 2023-06-28 DIAGNOSIS — M9904 Segmental and somatic dysfunction of sacral region: Secondary | ICD-10-CM | POA: Diagnosis not present

## 2023-06-28 DIAGNOSIS — M9905 Segmental and somatic dysfunction of pelvic region: Secondary | ICD-10-CM | POA: Diagnosis not present

## 2023-06-28 DIAGNOSIS — M9903 Segmental and somatic dysfunction of lumbar region: Secondary | ICD-10-CM | POA: Diagnosis not present

## 2023-07-12 ENCOUNTER — Other Ambulatory Visit: Payer: Self-pay | Admitting: Sports Medicine

## 2023-07-12 DIAGNOSIS — M9903 Segmental and somatic dysfunction of lumbar region: Secondary | ICD-10-CM | POA: Diagnosis not present

## 2023-07-12 DIAGNOSIS — M9904 Segmental and somatic dysfunction of sacral region: Secondary | ICD-10-CM | POA: Diagnosis not present

## 2023-07-12 DIAGNOSIS — F32A Depression, unspecified: Secondary | ICD-10-CM

## 2023-07-12 DIAGNOSIS — M9905 Segmental and somatic dysfunction of pelvic region: Secondary | ICD-10-CM | POA: Diagnosis not present

## 2023-07-12 NOTE — Telephone Encounter (Signed)
Kelli Newcomer, MD  to [facility]     07/12/23  2:29 PM Plz call the patient and inform that I would decrease xanax to 1.5 tab from 2 tab for anxiety She was prescribed effexor at last visit Refill sent to pharmacy   Tried calling patient. Number Rang busy.

## 2023-07-12 NOTE — Telephone Encounter (Signed)
Patient has request refill on medication Xanax 1mg . Patient medication last refilled 06/09/2023. Patient medication pend and sent to PCP Venita Sheffield, MD for approval.

## 2023-07-13 DIAGNOSIS — L218 Other seborrheic dermatitis: Secondary | ICD-10-CM | POA: Diagnosis not present

## 2023-07-13 DIAGNOSIS — H61031 Chondritis of right external ear: Secondary | ICD-10-CM | POA: Diagnosis not present

## 2023-07-13 DIAGNOSIS — D485 Neoplasm of uncertain behavior of skin: Secondary | ICD-10-CM | POA: Diagnosis not present

## 2023-07-13 NOTE — Telephone Encounter (Signed)
Left message on voicemail for patient to return call when available   

## 2023-07-13 NOTE — Telephone Encounter (Signed)
Attempted to call patient to discuss decrease in medication. Left voicemail to return call

## 2023-07-14 DIAGNOSIS — M25561 Pain in right knee: Secondary | ICD-10-CM | POA: Diagnosis not present

## 2023-07-18 DIAGNOSIS — M9903 Segmental and somatic dysfunction of lumbar region: Secondary | ICD-10-CM | POA: Diagnosis not present

## 2023-07-18 DIAGNOSIS — M5136 Other intervertebral disc degeneration, lumbar region with discogenic back pain only: Secondary | ICD-10-CM | POA: Diagnosis not present

## 2023-07-18 DIAGNOSIS — M9905 Segmental and somatic dysfunction of pelvic region: Secondary | ICD-10-CM | POA: Diagnosis not present

## 2023-07-18 DIAGNOSIS — M9904 Segmental and somatic dysfunction of sacral region: Secondary | ICD-10-CM | POA: Diagnosis not present

## 2023-07-25 DIAGNOSIS — E038 Other specified hypothyroidism: Secondary | ICD-10-CM | POA: Diagnosis not present

## 2023-07-25 DIAGNOSIS — Z131 Encounter for screening for diabetes mellitus: Secondary | ICD-10-CM | POA: Diagnosis not present

## 2023-07-25 DIAGNOSIS — F419 Anxiety disorder, unspecified: Secondary | ICD-10-CM | POA: Diagnosis not present

## 2023-07-25 DIAGNOSIS — I10 Essential (primary) hypertension: Secondary | ICD-10-CM | POA: Diagnosis not present

## 2023-07-25 DIAGNOSIS — F5101 Primary insomnia: Secondary | ICD-10-CM | POA: Diagnosis not present

## 2023-07-25 DIAGNOSIS — Z13220 Encounter for screening for lipoid disorders: Secondary | ICD-10-CM | POA: Diagnosis not present

## 2023-07-28 DIAGNOSIS — F411 Generalized anxiety disorder: Secondary | ICD-10-CM | POA: Diagnosis not present

## 2023-07-28 DIAGNOSIS — G4700 Insomnia, unspecified: Secondary | ICD-10-CM | POA: Diagnosis not present

## 2023-07-28 DIAGNOSIS — N83209 Unspecified ovarian cyst, unspecified side: Secondary | ICD-10-CM | POA: Diagnosis not present

## 2023-07-28 DIAGNOSIS — R7303 Prediabetes: Secondary | ICD-10-CM | POA: Diagnosis not present

## 2023-07-28 DIAGNOSIS — E039 Hypothyroidism, unspecified: Secondary | ICD-10-CM | POA: Diagnosis not present

## 2023-07-28 DIAGNOSIS — M25561 Pain in right knee: Secondary | ICD-10-CM | POA: Diagnosis not present

## 2023-07-28 DIAGNOSIS — S83231D Complex tear of medial meniscus, current injury, right knee, subsequent encounter: Secondary | ICD-10-CM | POA: Diagnosis not present

## 2023-07-28 DIAGNOSIS — I700 Atherosclerosis of aorta: Secondary | ICD-10-CM | POA: Diagnosis not present

## 2023-07-28 DIAGNOSIS — E785 Hyperlipidemia, unspecified: Secondary | ICD-10-CM | POA: Diagnosis not present

## 2023-07-28 DIAGNOSIS — M810 Age-related osteoporosis without current pathological fracture: Secondary | ICD-10-CM | POA: Diagnosis not present

## 2023-07-28 DIAGNOSIS — M25562 Pain in left knee: Secondary | ICD-10-CM | POA: Diagnosis not present

## 2023-07-28 DIAGNOSIS — Z79890 Hormone replacement therapy (postmenopausal): Secondary | ICD-10-CM | POA: Diagnosis not present

## 2023-08-13 ENCOUNTER — Other Ambulatory Visit: Payer: Self-pay | Admitting: Family Medicine

## 2023-08-18 ENCOUNTER — Telehealth: Payer: Self-pay

## 2023-08-18 NOTE — Telephone Encounter (Signed)
Patient called to say that she is no longer our patient after 11 years due to Dr.Veludandi changing her medications that she was stable on. Patient asked that I share this with Dr.Veludandi and the [facility] staff. Patient expressed that she will miss [place].

## 2023-09-06 DIAGNOSIS — R9389 Abnormal findings on diagnostic imaging of other specified body structures: Secondary | ICD-10-CM | POA: Diagnosis not present

## 2023-09-06 DIAGNOSIS — M25561 Pain in right knee: Secondary | ICD-10-CM | POA: Diagnosis not present

## 2023-09-06 DIAGNOSIS — S83206A Unspecified tear of unspecified meniscus, current injury, right knee, initial encounter: Secondary | ICD-10-CM | POA: Diagnosis not present

## 2023-09-06 DIAGNOSIS — M810 Age-related osteoporosis without current pathological fracture: Secondary | ICD-10-CM | POA: Diagnosis not present

## 2023-09-06 DIAGNOSIS — F411 Generalized anxiety disorder: Secondary | ICD-10-CM | POA: Diagnosis not present

## 2023-09-06 DIAGNOSIS — S83206D Unspecified tear of unspecified meniscus, current injury, right knee, subsequent encounter: Secondary | ICD-10-CM | POA: Diagnosis not present

## 2023-09-06 DIAGNOSIS — E039 Hypothyroidism, unspecified: Secondary | ICD-10-CM | POA: Diagnosis not present

## 2023-09-06 DIAGNOSIS — F419 Anxiety disorder, unspecified: Secondary | ICD-10-CM | POA: Diagnosis not present

## 2023-09-06 DIAGNOSIS — I700 Atherosclerosis of aorta: Secondary | ICD-10-CM | POA: Diagnosis not present

## 2023-09-06 DIAGNOSIS — R918 Other nonspecific abnormal finding of lung field: Secondary | ICD-10-CM | POA: Diagnosis not present

## 2023-09-06 DIAGNOSIS — Z0000 Encounter for general adult medical examination without abnormal findings: Secondary | ICD-10-CM | POA: Diagnosis not present

## 2023-09-06 DIAGNOSIS — Z9889 Other specified postprocedural states: Secondary | ICD-10-CM | POA: Diagnosis not present

## 2023-09-06 DIAGNOSIS — G4700 Insomnia, unspecified: Secondary | ICD-10-CM | POA: Diagnosis not present

## 2023-09-06 DIAGNOSIS — Z79899 Other long term (current) drug therapy: Secondary | ICD-10-CM | POA: Diagnosis not present

## 2023-09-06 DIAGNOSIS — Z79890 Hormone replacement therapy (postmenopausal): Secondary | ICD-10-CM | POA: Diagnosis not present

## 2023-09-07 DIAGNOSIS — L918 Other hypertrophic disorders of the skin: Secondary | ICD-10-CM | POA: Diagnosis not present

## 2023-09-21 DIAGNOSIS — G4700 Insomnia, unspecified: Secondary | ICD-10-CM | POA: Diagnosis not present

## 2023-09-21 DIAGNOSIS — N951 Menopausal and female climacteric states: Secondary | ICD-10-CM | POA: Diagnosis not present

## 2023-09-21 DIAGNOSIS — L309 Dermatitis, unspecified: Secondary | ICD-10-CM | POA: Diagnosis not present

## 2023-09-21 DIAGNOSIS — L600 Ingrowing nail: Secondary | ICD-10-CM | POA: Diagnosis not present

## 2023-09-21 DIAGNOSIS — L918 Other hypertrophic disorders of the skin: Secondary | ICD-10-CM | POA: Diagnosis not present

## 2023-09-21 DIAGNOSIS — R7303 Prediabetes: Secondary | ICD-10-CM | POA: Diagnosis not present

## 2023-09-21 DIAGNOSIS — Z79890 Hormone replacement therapy (postmenopausal): Secondary | ICD-10-CM | POA: Diagnosis not present

## 2023-09-28 DIAGNOSIS — Z79899 Other long term (current) drug therapy: Secondary | ICD-10-CM | POA: Diagnosis not present

## 2023-09-28 DIAGNOSIS — G4700 Insomnia, unspecified: Secondary | ICD-10-CM | POA: Diagnosis not present

## 2023-09-28 DIAGNOSIS — F419 Anxiety disorder, unspecified: Secondary | ICD-10-CM | POA: Diagnosis not present

## 2023-10-05 DIAGNOSIS — G4700 Insomnia, unspecified: Secondary | ICD-10-CM | POA: Diagnosis not present

## 2023-10-05 DIAGNOSIS — F411 Generalized anxiety disorder: Secondary | ICD-10-CM | POA: Diagnosis not present

## 2023-11-07 DIAGNOSIS — R208 Other disturbances of skin sensation: Secondary | ICD-10-CM | POA: Diagnosis not present

## 2023-11-07 DIAGNOSIS — L820 Inflamed seborrheic keratosis: Secondary | ICD-10-CM | POA: Diagnosis not present

## 2023-11-07 DIAGNOSIS — L918 Other hypertrophic disorders of the skin: Secondary | ICD-10-CM | POA: Diagnosis not present

## 2023-11-29 ENCOUNTER — Telehealth: Payer: Self-pay | Admitting: Geriatric Medicine

## 2023-11-29 NOTE — Telephone Encounter (Signed)
 Do not wish to be seen at this time. Do not cal regarding the referral. She was informed referral good for an year and to call us if she needs anything further.

## 2023-12-13 DIAGNOSIS — M25562 Pain in left knee: Secondary | ICD-10-CM | POA: Diagnosis not present

## 2023-12-13 DIAGNOSIS — F411 Generalized anxiety disorder: Secondary | ICD-10-CM | POA: Diagnosis not present

## 2023-12-13 DIAGNOSIS — I700 Atherosclerosis of aorta: Secondary | ICD-10-CM | POA: Diagnosis not present

## 2023-12-13 DIAGNOSIS — G4700 Insomnia, unspecified: Secondary | ICD-10-CM | POA: Diagnosis not present

## 2023-12-13 DIAGNOSIS — Z79899 Other long term (current) drug therapy: Secondary | ICD-10-CM | POA: Diagnosis not present

## 2023-12-13 DIAGNOSIS — G8929 Other chronic pain: Secondary | ICD-10-CM | POA: Diagnosis not present

## 2024-01-24 DIAGNOSIS — M25562 Pain in left knee: Secondary | ICD-10-CM | POA: Diagnosis not present

## 2024-01-24 DIAGNOSIS — Z79899 Other long term (current) drug therapy: Secondary | ICD-10-CM | POA: Diagnosis not present

## 2024-01-24 DIAGNOSIS — M25561 Pain in right knee: Secondary | ICD-10-CM | POA: Diagnosis not present

## 2024-01-24 DIAGNOSIS — R7303 Prediabetes: Secondary | ICD-10-CM | POA: Diagnosis not present

## 2024-01-24 DIAGNOSIS — S83206A Unspecified tear of unspecified meniscus, current injury, right knee, initial encounter: Secondary | ICD-10-CM | POA: Diagnosis not present

## 2024-01-24 DIAGNOSIS — G8929 Other chronic pain: Secondary | ICD-10-CM | POA: Diagnosis not present

## 2024-01-24 DIAGNOSIS — F411 Generalized anxiety disorder: Secondary | ICD-10-CM | POA: Diagnosis not present

## 2024-01-30 DIAGNOSIS — H16223 Keratoconjunctivitis sicca, not specified as Sjogren's, bilateral: Secondary | ICD-10-CM | POA: Diagnosis not present

## 2024-01-30 DIAGNOSIS — H0288A Meibomian gland dysfunction right eye, upper and lower eyelids: Secondary | ICD-10-CM | POA: Diagnosis not present

## 2024-01-30 DIAGNOSIS — H532 Diplopia: Secondary | ICD-10-CM | POA: Diagnosis not present

## 2024-01-30 DIAGNOSIS — H40013 Open angle with borderline findings, low risk, bilateral: Secondary | ICD-10-CM | POA: Diagnosis not present

## 2024-01-30 DIAGNOSIS — H0288B Meibomian gland dysfunction left eye, upper and lower eyelids: Secondary | ICD-10-CM | POA: Diagnosis not present

## 2024-01-30 DIAGNOSIS — Z961 Presence of intraocular lens: Secondary | ICD-10-CM | POA: Diagnosis not present

## 2024-02-23 DIAGNOSIS — M159 Polyosteoarthritis, unspecified: Secondary | ICD-10-CM | POA: Diagnosis not present

## 2024-02-23 DIAGNOSIS — Z0001 Encounter for general adult medical examination with abnormal findings: Secondary | ICD-10-CM | POA: Diagnosis not present

## 2024-02-23 DIAGNOSIS — M51369 Other intervertebral disc degeneration, lumbar region without mention of lumbar back pain or lower extremity pain: Secondary | ICD-10-CM | POA: Diagnosis not present

## 2024-02-23 DIAGNOSIS — F419 Anxiety disorder, unspecified: Secondary | ICD-10-CM | POA: Diagnosis not present

## 2024-02-23 DIAGNOSIS — E663 Overweight: Secondary | ICD-10-CM | POA: Diagnosis not present

## 2024-02-23 DIAGNOSIS — E039 Hypothyroidism, unspecified: Secondary | ICD-10-CM | POA: Diagnosis not present

## 2024-02-23 DIAGNOSIS — G4700 Insomnia, unspecified: Secondary | ICD-10-CM | POA: Diagnosis not present

## 2024-02-23 DIAGNOSIS — Z79899 Other long term (current) drug therapy: Secondary | ICD-10-CM | POA: Diagnosis not present

## 2024-02-23 DIAGNOSIS — Z1159 Encounter for screening for other viral diseases: Secondary | ICD-10-CM | POA: Diagnosis not present

## 2024-02-23 DIAGNOSIS — Z0000 Encounter for general adult medical examination without abnormal findings: Secondary | ICD-10-CM | POA: Diagnosis not present

## 2024-02-23 DIAGNOSIS — R7303 Prediabetes: Secondary | ICD-10-CM | POA: Diagnosis not present

## 2024-02-23 DIAGNOSIS — I10 Essential (primary) hypertension: Secondary | ICD-10-CM | POA: Diagnosis not present

## 2024-02-23 DIAGNOSIS — Z1321 Encounter for screening for nutritional disorder: Secondary | ICD-10-CM | POA: Diagnosis not present

## 2024-03-22 DIAGNOSIS — Z0001 Encounter for general adult medical examination with abnormal findings: Secondary | ICD-10-CM | POA: Diagnosis not present

## 2024-03-22 DIAGNOSIS — E039 Hypothyroidism, unspecified: Secondary | ICD-10-CM | POA: Diagnosis not present

## 2024-03-22 DIAGNOSIS — M159 Polyosteoarthritis, unspecified: Secondary | ICD-10-CM | POA: Diagnosis not present

## 2024-03-22 DIAGNOSIS — E785 Hyperlipidemia, unspecified: Secondary | ICD-10-CM | POA: Diagnosis not present

## 2024-03-22 DIAGNOSIS — E663 Overweight: Secondary | ICD-10-CM | POA: Diagnosis not present

## 2024-03-22 DIAGNOSIS — I10 Essential (primary) hypertension: Secondary | ICD-10-CM | POA: Diagnosis not present

## 2024-03-22 DIAGNOSIS — R7303 Prediabetes: Secondary | ICD-10-CM | POA: Diagnosis not present

## 2024-03-22 DIAGNOSIS — Z7189 Other specified counseling: Secondary | ICD-10-CM | POA: Diagnosis not present

## 2024-03-22 DIAGNOSIS — G4700 Insomnia, unspecified: Secondary | ICD-10-CM | POA: Diagnosis not present

## 2024-04-12 ENCOUNTER — Encounter: Admitting: Family Medicine

## 2024-04-16 ENCOUNTER — Encounter: Admitting: Family Medicine

## 2024-04-26 DIAGNOSIS — M159 Polyosteoarthritis, unspecified: Secondary | ICD-10-CM | POA: Diagnosis not present

## 2024-04-26 DIAGNOSIS — E663 Overweight: Secondary | ICD-10-CM | POA: Diagnosis not present

## 2024-04-26 DIAGNOSIS — I10 Essential (primary) hypertension: Secondary | ICD-10-CM | POA: Diagnosis not present

## 2024-05-08 DIAGNOSIS — M25561 Pain in right knee: Secondary | ICD-10-CM | POA: Diagnosis not present

## 2024-05-08 DIAGNOSIS — M25562 Pain in left knee: Secondary | ICD-10-CM | POA: Diagnosis not present

## 2024-05-14 DIAGNOSIS — M25562 Pain in left knee: Secondary | ICD-10-CM | POA: Diagnosis not present

## 2024-05-14 DIAGNOSIS — M25561 Pain in right knee: Secondary | ICD-10-CM | POA: Diagnosis not present

## 2024-05-15 DIAGNOSIS — M25561 Pain in right knee: Secondary | ICD-10-CM | POA: Diagnosis not present

## 2024-05-15 DIAGNOSIS — M25562 Pain in left knee: Secondary | ICD-10-CM | POA: Diagnosis not present

## 2024-05-22 DIAGNOSIS — M1712 Unilateral primary osteoarthritis, left knee: Secondary | ICD-10-CM | POA: Diagnosis not present

## 2024-05-22 DIAGNOSIS — M1711 Unilateral primary osteoarthritis, right knee: Secondary | ICD-10-CM | POA: Diagnosis not present

## 2024-05-28 DIAGNOSIS — M1712 Unilateral primary osteoarthritis, left knee: Secondary | ICD-10-CM | POA: Diagnosis not present

## 2024-05-28 DIAGNOSIS — M1711 Unilateral primary osteoarthritis, right knee: Secondary | ICD-10-CM | POA: Diagnosis not present

## 2024-05-30 DIAGNOSIS — M1711 Unilateral primary osteoarthritis, right knee: Secondary | ICD-10-CM | POA: Diagnosis not present

## 2024-05-30 DIAGNOSIS — M1712 Unilateral primary osteoarthritis, left knee: Secondary | ICD-10-CM | POA: Diagnosis not present

## 2024-06-04 DIAGNOSIS — M1712 Unilateral primary osteoarthritis, left knee: Secondary | ICD-10-CM | POA: Diagnosis not present

## 2024-06-04 DIAGNOSIS — M1711 Unilateral primary osteoarthritis, right knee: Secondary | ICD-10-CM | POA: Diagnosis not present

## 2024-06-06 DIAGNOSIS — M1712 Unilateral primary osteoarthritis, left knee: Secondary | ICD-10-CM | POA: Diagnosis not present

## 2024-06-06 DIAGNOSIS — M1711 Unilateral primary osteoarthritis, right knee: Secondary | ICD-10-CM | POA: Diagnosis not present

## 2024-06-13 DIAGNOSIS — M1712 Unilateral primary osteoarthritis, left knee: Secondary | ICD-10-CM | POA: Diagnosis not present

## 2024-06-13 DIAGNOSIS — M1711 Unilateral primary osteoarthritis, right knee: Secondary | ICD-10-CM | POA: Diagnosis not present

## 2024-06-18 DIAGNOSIS — M1711 Unilateral primary osteoarthritis, right knee: Secondary | ICD-10-CM | POA: Diagnosis not present

## 2024-06-18 DIAGNOSIS — M1712 Unilateral primary osteoarthritis, left knee: Secondary | ICD-10-CM | POA: Diagnosis not present

## 2024-06-25 DIAGNOSIS — L309 Dermatitis, unspecified: Secondary | ICD-10-CM | POA: Diagnosis not present

## 2024-06-25 DIAGNOSIS — E663 Overweight: Secondary | ICD-10-CM | POA: Diagnosis not present

## 2024-06-25 DIAGNOSIS — I10 Essential (primary) hypertension: Secondary | ICD-10-CM | POA: Diagnosis not present

## 2024-06-25 DIAGNOSIS — Z23 Encounter for immunization: Secondary | ICD-10-CM | POA: Diagnosis not present

## 2024-06-25 DIAGNOSIS — M159 Polyosteoarthritis, unspecified: Secondary | ICD-10-CM | POA: Diagnosis not present

## 2024-06-25 DIAGNOSIS — F419 Anxiety disorder, unspecified: Secondary | ICD-10-CM | POA: Diagnosis not present

## 2024-06-25 DIAGNOSIS — G4700 Insomnia, unspecified: Secondary | ICD-10-CM | POA: Diagnosis not present

## 2024-07-02 ENCOUNTER — Ambulatory Visit: Admitting: Podiatry

## 2024-07-02 ENCOUNTER — Encounter: Payer: Self-pay | Admitting: Podiatry

## 2024-07-02 DIAGNOSIS — L600 Ingrowing nail: Secondary | ICD-10-CM

## 2024-07-02 NOTE — Progress Notes (Signed)
 This patient presents to the office concerned about an ingrown toenail right big toe.  She says she frequently pulls the inside border right great toenail with her fingers to assure the nail does not get ingrown.  She presents to the office for evaluation and treatment.  She has CKD.  General Appearance  Alert, conversant and in no acute stress.  Vascular  Dorsalis pedis and posterior tibial  pulses are palpable  bilaterally.  Capillary return is within normal limits  bilaterally. Temperature is within normal limits  bilaterally.  Neurologic  Senn-Weinstein monofilament wire test within normal limits  bilaterally. Muscle power within normal limits bilaterally.  Nails Thick disfigured discolored nails with subungual debris  hallux nails bilaterally. No evidence of bacterial infection or drainage bilaterally. Multiple pincer toenails noted.  Orthopedic  No limitations of motion  feet .  No crepitus or effusions noted.  No bony pathology or digital deformities noted.  Skin  normotropic skin with no porokeratosis noted bilaterally.  No signs of infections or ulcers noted.     Ingrown Toenail right hallux.  Pincer nails.  IE.  Debride nails hallux  B/L.  Discussed conservative and surgical treatment with this patient.  RTC 4 months   Cordella Bold DPM

## 2024-07-16 DIAGNOSIS — M9904 Segmental and somatic dysfunction of sacral region: Secondary | ICD-10-CM | POA: Diagnosis not present

## 2024-07-16 DIAGNOSIS — M9905 Segmental and somatic dysfunction of pelvic region: Secondary | ICD-10-CM | POA: Diagnosis not present

## 2024-07-16 DIAGNOSIS — M9903 Segmental and somatic dysfunction of lumbar region: Secondary | ICD-10-CM | POA: Diagnosis not present

## 2024-07-16 DIAGNOSIS — M5136 Other intervertebral disc degeneration, lumbar region with discogenic back pain only: Secondary | ICD-10-CM | POA: Diagnosis not present

## 2024-07-17 DIAGNOSIS — M25562 Pain in left knee: Secondary | ICD-10-CM | POA: Diagnosis not present

## 2024-07-17 DIAGNOSIS — M25561 Pain in right knee: Secondary | ICD-10-CM | POA: Diagnosis not present

## 2024-07-17 DIAGNOSIS — M23206 Derangement of unspecified meniscus due to old tear or injury, right knee: Secondary | ICD-10-CM | POA: Diagnosis not present

## 2024-07-17 DIAGNOSIS — M1711 Unilateral primary osteoarthritis, right knee: Secondary | ICD-10-CM | POA: Diagnosis not present

## 2024-07-23 DIAGNOSIS — M9904 Segmental and somatic dysfunction of sacral region: Secondary | ICD-10-CM | POA: Diagnosis not present

## 2024-07-23 DIAGNOSIS — M9903 Segmental and somatic dysfunction of lumbar region: Secondary | ICD-10-CM | POA: Diagnosis not present

## 2024-07-23 DIAGNOSIS — M5136 Other intervertebral disc degeneration, lumbar region with discogenic back pain only: Secondary | ICD-10-CM | POA: Diagnosis not present

## 2024-07-23 DIAGNOSIS — M9905 Segmental and somatic dysfunction of pelvic region: Secondary | ICD-10-CM | POA: Diagnosis not present

## 2024-07-24 DIAGNOSIS — M5136 Other intervertebral disc degeneration, lumbar region with discogenic back pain only: Secondary | ICD-10-CM | POA: Diagnosis not present

## 2024-07-24 DIAGNOSIS — M9904 Segmental and somatic dysfunction of sacral region: Secondary | ICD-10-CM | POA: Diagnosis not present

## 2024-07-24 DIAGNOSIS — M9903 Segmental and somatic dysfunction of lumbar region: Secondary | ICD-10-CM | POA: Diagnosis not present

## 2024-07-24 DIAGNOSIS — M9905 Segmental and somatic dysfunction of pelvic region: Secondary | ICD-10-CM | POA: Diagnosis not present

## 2024-08-05 DIAGNOSIS — M9904 Segmental and somatic dysfunction of sacral region: Secondary | ICD-10-CM | POA: Diagnosis not present

## 2024-08-05 DIAGNOSIS — M9903 Segmental and somatic dysfunction of lumbar region: Secondary | ICD-10-CM | POA: Diagnosis not present

## 2024-08-05 DIAGNOSIS — M9905 Segmental and somatic dysfunction of pelvic region: Secondary | ICD-10-CM | POA: Diagnosis not present

## 2024-08-05 DIAGNOSIS — M5136 Other intervertebral disc degeneration, lumbar region with discogenic back pain only: Secondary | ICD-10-CM | POA: Diagnosis not present

## 2024-08-06 DIAGNOSIS — M9905 Segmental and somatic dysfunction of pelvic region: Secondary | ICD-10-CM | POA: Diagnosis not present

## 2024-08-06 DIAGNOSIS — M9903 Segmental and somatic dysfunction of lumbar region: Secondary | ICD-10-CM | POA: Diagnosis not present

## 2024-08-06 DIAGNOSIS — M5136 Other intervertebral disc degeneration, lumbar region with discogenic back pain only: Secondary | ICD-10-CM | POA: Diagnosis not present

## 2024-08-06 DIAGNOSIS — M9904 Segmental and somatic dysfunction of sacral region: Secondary | ICD-10-CM | POA: Diagnosis not present

## 2024-09-25 ENCOUNTER — Ambulatory Visit: Admitting: Podiatry

## 2024-10-01 ENCOUNTER — Ambulatory Visit: Admitting: Podiatry

## 2024-10-15 ENCOUNTER — Ambulatory Visit (INDEPENDENT_AMBULATORY_CARE_PROVIDER_SITE_OTHER): Admitting: Podiatry

## 2024-10-15 ENCOUNTER — Encounter: Payer: Self-pay | Admitting: Podiatry

## 2024-10-15 DIAGNOSIS — L600 Ingrowing nail: Secondary | ICD-10-CM | POA: Diagnosis not present

## 2024-10-15 NOTE — Progress Notes (Signed)
 This patient presents to the office concerned about an ingrown toenail right big toe.  She says she frequently pulls the outside  border left  great toenail with her fingers to assure the nail does not get ingrown.  She presents to the office for evaluation and treatment.  She has CKD.  General Appearance  Alert, conversant and in no acute stress.  Vascular  Dorsalis pedis and posterior tibial  pulses are palpable  bilaterally.  Capillary return is within normal limits  bilaterally. Temperature is within normal limits  bilaterally.  Neurologic  Senn-Weinstein monofilament wire test within normal limits  bilaterally. Muscle power within normal limits bilaterally.  Nails Thick disfigured discolored nails with subungual debris  hallux nails bilaterally. No evidence of bacterial infection or drainage bilaterally. Multiple pincer toenails noted.  Orthopedic  No limitations of motion  feet .  No crepitus or effusions noted.  No bony pathology or digital deformities noted.  Skin  normotropic skin with no porokeratosis noted bilaterally.  No signs of infections or ulcers noted.     Ingrown Toenail left  hallux.  Pincer nails.  Debride nails hallux  B/L.  Discussed conservative and surgical treatment with this patient.  RTC 4 months   Cordella Bold DPM
